# Patient Record
Sex: Female | Born: 1995
Health system: Southern US, Community
[De-identification: ages and names within clinical notes are randomized; demographics above are authoritative.]

## PROBLEM LIST (undated history)

## (undated) ENCOUNTER — Inpatient Hospital Stay (HOSPITAL_COMMUNITY): Payer: Self-pay

## (undated) ENCOUNTER — Emergency Department (HOSPITAL_BASED_OUTPATIENT_CLINIC_OR_DEPARTMENT_OTHER): Admission: EM | Payer: MEDICAID | Source: Home / Self Care

## (undated) DIAGNOSIS — G43909 Migraine, unspecified, not intractable, without status migrainosus: Secondary | ICD-10-CM

## (undated) DIAGNOSIS — D649 Anemia, unspecified: Secondary | ICD-10-CM

## (undated) DIAGNOSIS — K219 Gastro-esophageal reflux disease without esophagitis: Secondary | ICD-10-CM

## (undated) DIAGNOSIS — Z87442 Personal history of urinary calculi: Secondary | ICD-10-CM

## (undated) HISTORY — PX: NO PAST SURGERIES: SHX2092

## (undated) HISTORY — PX: WISDOM TOOTH EXTRACTION: SHX21

## (undated) HISTORY — DX: Migraine, unspecified, not intractable, without status migrainosus: G43.909

---
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
## 2013-04-04 ENCOUNTER — Encounter (HOSPITAL_COMMUNITY): Payer: Self-pay | Admitting: General Practice

## 2013-04-04 ENCOUNTER — Inpatient Hospital Stay (HOSPITAL_COMMUNITY)
Admission: AD | Admit: 2013-04-04 | Discharge: 2013-04-04 | Disposition: A | Discharge status: Home / Self Care | Payer: Medicaid Other | Source: Ambulatory Visit | Attending: Obstetrics and Gynecology | Admitting: Obstetrics and Gynecology

## 2013-04-04 DIAGNOSIS — R109 Unspecified abdominal pain: Secondary | ICD-10-CM | POA: Insufficient documentation

## 2013-04-04 DIAGNOSIS — N912 Amenorrhea, unspecified: Secondary | ICD-10-CM | POA: Insufficient documentation

## 2013-04-04 DIAGNOSIS — Z3202 Encounter for pregnancy test, result negative: Secondary | ICD-10-CM | POA: Insufficient documentation

## 2013-04-04 LAB — POCT PREGNANCY, URINE: Preg Test, Ur: NEGATIVE

## 2013-04-04 LAB — URINALYSIS, ROUTINE W REFLEX MICROSCOPIC
Bilirubin Urine: NEGATIVE
Glucose, UA: NEGATIVE mg/dL
Hgb urine dipstick: NEGATIVE
Ketones, ur: NEGATIVE mg/dL
Protein, ur: NEGATIVE mg/dL

## 2013-04-04 NOTE — MAU Note (Signed)
Pt left without being seen.

## 2013-04-04 NOTE — MAU Note (Signed)
I've been having real bad cramping for 2 weeks. My birth control ran out and I couldn't get more. My period was supposed to come on the 7th but it didn't start.

## 2013-12-12 ENCOUNTER — Encounter (HOSPITAL_COMMUNITY): Payer: Self-pay | Admitting: Emergency Medicine

## 2013-12-12 ENCOUNTER — Emergency Department (HOSPITAL_COMMUNITY): Payer: Medicaid Other

## 2013-12-12 ENCOUNTER — Emergency Department (HOSPITAL_COMMUNITY)
Admission: EM | Admit: 2013-12-12 | Discharge: 2013-12-12 | Disposition: A | Discharge status: Home / Self Care | Payer: Medicaid Other | Attending: Emergency Medicine | Admitting: Emergency Medicine

## 2013-12-12 DIAGNOSIS — R519 Headache, unspecified: Secondary | ICD-10-CM

## 2013-12-12 DIAGNOSIS — R51 Headache: Secondary | ICD-10-CM | POA: Insufficient documentation

## 2013-12-12 DIAGNOSIS — H53149 Visual discomfort, unspecified: Secondary | ICD-10-CM | POA: Insufficient documentation

## 2013-12-12 DIAGNOSIS — F17200 Nicotine dependence, unspecified, uncomplicated: Secondary | ICD-10-CM | POA: Insufficient documentation

## 2013-12-12 DIAGNOSIS — R079 Chest pain, unspecified: Secondary | ICD-10-CM | POA: Insufficient documentation

## 2013-12-12 DIAGNOSIS — Z79899 Other long term (current) drug therapy: Secondary | ICD-10-CM | POA: Insufficient documentation

## 2013-12-12 MED ORDER — METOCLOPRAMIDE HCL 5 MG/ML IJ SOLN
10.0000 mg | Freq: Once | INTRAMUSCULAR | Status: AC
  Administered 2013-12-12: 10 mg via INTRAMUSCULAR
  Filled 2013-12-12: qty 2

## 2013-12-12 MED ORDER — DIPHENHYDRAMINE HCL 50 MG/ML IJ SOLN
25.0000 mg | Freq: Once | INTRAMUSCULAR | Status: AC
  Administered 2013-12-12: 25 mg via INTRAMUSCULAR
  Filled 2013-12-12: qty 1

## 2013-12-12 MED ORDER — OMEPRAZOLE 20 MG PO CPDR: DELAYED_RELEASE_CAPSULE | ORAL | Status: DC

## 2013-12-12 NOTE — ED Notes (Addendum)
Pt reports having a migraine that started two weeks ago. Pt reports intermittent light and sound sensitivity, however denies nausea, and emesis. Pt also reports left sided chest pain that has been "ongoing 8th grade," which she describes as a "lightening" sensation. Pt reports intermittent shortness of breath. Pt is A/O x4, in NAD, and vitals are WDL.

## 2013-12-12 NOTE — ED Provider Notes (Signed)
CSN: [id number]     Arrival date & time 12/12/13  3086 History   First MD Initiated Contact with Patient 12/12/13 1856     Chief Complaint  Patient presents with  . Migraine  . Chest Pain     (Consider location/radiation/quality/duration/timing/severity/associated sxs/prior Treatment) HPI Comments: Patient presents with complaint of migraine headache as well as chest pain. Patient has had frequent chest pains for the past 4-5 years. Patient describes pain as being in the middle of her chest, described as "lightening", coming in quickly lasting for several seconds and then resolving. Sometimes the pain comes back to back. Sometimes the pain returns after an hour. She will go days to weeks without having pain. She has been to the ED several times and never has gotten a diagnosis. She has never seen a primary care physician for workup. Pain does not change with food. She does not feel short of breath or lightheaded. Patient denies risk factors for pulmonary embolism including: unilateral leg swelling, history of DVT/PE/other blood clots, use of estrogens, recent immobilizations, recent surgery, recent travel (>4hr segment), malignancy, hemoptysis. Sometimes patient has sour taste in mouth and feels like food moves slowly down her esophagus after swallowing. No regurgitation.   Headache is same as previous migraine headaches. Her headache today started at 1:30 PM. It is frontal and sharp. She has photophobia. No nausea or vomiting. No treatments prior to arrival for either problem. No head injury, fever, or neck pain.    Patient is a 18 y.o. female presenting with migraines and chest pain. The history is provided by the patient.  Migraine Associated symptoms include chest pain and headaches. Pertinent negatives include no abdominal pain, congestion, coughing, diaphoresis, fever, nausea, neck pain, numbness, rash, vomiting or weakness.  Chest Pain Associated symptoms: headache   Associated symptoms:  no abdominal pain, no back pain, no cough, no diaphoresis, no fever, no nausea, no numbness, no palpitations, no shortness of breath, not vomiting and no weakness     History reviewed. No pertinent past medical history. History reviewed. No pertinent past surgical history. No family history on file. History  Substance Use Topics  . Smoking status: Current Some Day Smoker -- 0.25 packs/day    Types: Cigarettes  . Smokeless tobacco: Current User  . Alcohol Use: No   OB History   Grav Para Term Preterm Abortions TAB SAB Ect Mult Living   1 1 1       1      Review of Systems  Constitutional: Negative for fever and diaphoresis.  HENT: Negative for congestion, dental problem, rhinorrhea and sinus pressure.   Eyes: Positive for photophobia. Negative for discharge, redness and visual disturbance.  Respiratory: Negative for cough and shortness of breath.   Cardiovascular: Positive for chest pain. Negative for palpitations and leg swelling.  Gastrointestinal: Negative for nausea, vomiting and abdominal pain.  Genitourinary: Negative for dysuria.  Musculoskeletal: Negative for back pain, gait problem, neck pain and neck stiffness.  Skin: Negative for rash.  Neurological: Positive for headaches. Negative for syncope, speech difficulty, weakness, light-headedness and numbness.  Psychiatric/Behavioral: Negative for confusion.    Allergies  Review of patient's allergies indicates no known allergies.  Home Medications   Prior to Admission medications   Medication Sig Start Date End Date Taking? Authorizing Provider  ferrous fumarate (HEMOCYTE - 106 MG FE) 325 (106 FE) MG TABS Take 1 tablet by mouth daily.    Historical Provider, MD   BP 107/58  Pulse 98  Temp(Src)  98.6 F (37 C) (Oral)  Resp 18  SpO2 99%  LMP 11/11/2013  Physical Exam  Nursing note and vitals reviewed. Constitutional: She is oriented to person, place, and time. She appears well-developed and well-nourished.  HENT:   Head: Normocephalic and atraumatic.  Right Ear: Tympanic membrane, external ear and ear canal normal.  Left Ear: Tympanic membrane, external ear and ear canal normal.  Nose: Nose normal.  Mouth/Throat: Uvula is midline, oropharynx is clear and moist and mucous membranes are normal. Mucous membranes are not dry.  Eyes: Conjunctivae, EOM and lids are normal. Pupils are equal, round, and reactive to light. Right eye exhibits no nystagmus. Left eye exhibits no nystagmus.  Neck: Trachea normal and normal range of motion. Neck supple. No JVD present. No muscular tenderness present. No tracheal deviation present.  Cardiovascular: Normal rate, regular rhythm, S1 normal, S2 normal, normal heart sounds and intact distal pulses.  Exam reveals no decreased pulses.   No murmur heard. Pulmonary/Chest: Effort normal and breath sounds normal. No respiratory distress. She has no wheezes. She exhibits no tenderness.  Abdominal: Soft. Normal aorta and bowel sounds are normal. There is no tenderness. There is no rebound and no guarding.  Musculoskeletal: Normal range of motion.       Cervical back: She exhibits normal range of motion, no tenderness and no bony tenderness.  Neurological: She is alert and oriented to person, place, and time. She has normal strength and normal reflexes. No cranial nerve deficit or sensory deficit. She displays a negative Romberg sign. Coordination and gait normal. GCS eye subscore is 4. GCS verbal subscore is 5. GCS motor subscore is 6.  Skin: Skin is warm and dry. She is not diaphoretic. No cyanosis. No pallor.  Psychiatric: She has a normal mood and affect.    ED Course  Procedures (including critical care time) Labs Review Labs Reviewed - No data to display  Imaging Review Dg Chest 2 View  12/12/2013   CLINICAL DATA:  Chest pain  EXAM: CHEST  2 VIEW  COMPARISON:  05/25/2008  FINDINGS: The lungs are clear without focal consolidation, edema, effusion or pneumothorax. Cardio  pericardial silhouette is within normal limits for size. Imaged bony structures of the thorax are intact. Telemetry leads overlie the chest.  IMPRESSION: Stable.  No acute findings.   Electronically Signed   By: Kennith Center M.D.   On: 12/12/2013 19:57     EKG Interpretation None       Date: 12/12/2013  Rate:98  Rhythm: normal sinus rhythm  QRS Axis: normal  Intervals: normal  ST/T Wave abnormalities: none  Conduction Disutrbances:none  Narrative Interpretation:   Old EKG Reviewed: none available   7:26 PM Patient seen and examined. Plan: check EKG, CXR, migraine cocktail. We discussed the importance of having PCP care for problems like this. Will discharge home on PPI trial in case this represents esophageal spasm.   Vital signs reviewed and are as follows: Filed Vitals:   12/12/13 1832  BP: 107/58  Pulse: 98  Temp: 98.6 F (37 C)  Resp: 18   8:58 PM Patient with complete resolution of headache pain after migraine cocktail. No return of chest pain. Patient informed of her chest x-ray and EKG results. Will start patient on Prilosec. PCP referral given. Patient encouraged to followup with primary care physician for this per previous discussion.  Patient was counseled to return with severe chest pain, especially if the pain is crushing or pressure-like and spreads to the arms, back,  neck, or jaw, or if they have sweating, nausea, or shortness of breath with the pain. They were encouraged to call 911 with these symptoms.   They were also told to return if their chest pain gets worse and does not go away with rest, they have an attack of chest pain lasting longer than usual despite rest and treatment with the medications their caregiver has prescribed, if they wake from sleep with chest pain or shortness of breath, if they feel dizzy or faint, if they have chest pain not typical of their usual pain, or if they have any other emergent concerns regarding their health.  The patient  verbalized understanding and agreed.   MDM   Final diagnoses:  Acute nonintractable headache, unspecified headache type  Chest pain, unspecified chest pain type   HA: Symptoms resolved with migraine cocktail. Patient without high-risk features of headache including: sudden onset/thunderclap HA, no similar headache in past, altered mental status, accompanying seizure, headache with exertion, age > 8750, history of immunocompromised, neck or shoulder pain, fever, use of anticoagulation, family history of spontaneous SAH, concomitant drug use, toxic exposure.   Patient has a normal complete neurological exam, normal vital signs, normal level of consciousness, no signs of meningismus, is well-appearing/non-toxic appearing, no signs of trauma, no pain over the temporal arteries.   Imaging with CT/MRI not indicated given history and physical exam findings.   No dangerous or life-threatening conditions suspected or identified by history, physical exam, and by work-up. No indications for hospitalization identified.   CP: intermittent x 5 years. Do not suspect persistent pain from PE with this character. Possible esophageal spasm -- will give Trial of omeprazole. Also consider precordial catch. Doubt gallbladder, pancreatitis. PCP follow-up stressed.      Renne CriglerJoshua Addis Bennie, PA-C 12/12/13 2103

## 2013-12-12 NOTE — Discharge Instructions (Signed)
Please read and follow all provided instructions.  Your diagnoses today include:  1. Acute nonintractable headache, unspecified headache type   2. Chest pain, unspecified chest pain type     Tests performed today include:  Chest x-ray - normal  EKG - normal  Vital signs. See below for your results today.   Medications:  In the Emergency Department you received:  Reglan - antinausea/headache medication  Benadryl - antihistamine to counteract potential side effects of reglan   Omeprazole (Prilosec) - stomach acid reducer  This medication can be found over-the-counter  Take any prescribed medications only as directed.  Additional information:  Follow any educational materials contained in this packet.  You are having a headache. No specific cause was found today for your headache. It may have been a migraine or other cause of headache. Stress, anxiety, fatigue, and depression are common triggers for headaches.   Your headache today does not appear to be life-threatening or require hospitalization, but often the exact cause of headaches is not determined in the emergency department. Therefore, follow-up with your doctor is very important to find out what may have caused your headache and whether or not you need any further diagnostic testing or treatment.   Sometimes headaches can appear benign (not harmful), but then more serious symptoms can develop which should prompt an immediate re-evaluation by your doctor or the emergency department.  BE VERY CAREFUL not to take multiple medicines containing Tylenol (also called acetaminophen). Doing so can lead to an overdose which can damage your liver and cause liver failure and possibly death.   Follow-up instructions: Please follow-up with your primary care provider in the next 3 days for further evaluation of your symptoms.   Return instructions:   Please return to the Emergency Department if you experience worsening  symptoms.  Return if the medications do not resolve your headache, if it recurs, or if you have multiple episodes of vomiting or cannot keep down fluids.  Return if you have a change from the usual headache.  RETURN IMMEDIATELY IF you:  Develop a sudden, severe headache  Develop confusion or become poorly responsive or faint  Develop a fever above 100.58F or problem breathing  Have a change in speech, vision, swallowing, or understanding  Develop new weakness, numbness, tingling, incoordination in your arms or legs  Have a seizure  Please return if you have any other emergent concerns.  Additional Information:  Your vital signs today were: BP 107/58   Pulse 98   Temp(Src) 98.6 F (37 C) (Oral)   Resp 18   SpO2 99%   LMP 11/11/2013 If your blood pressure (BP) was elevated above 135/85 this visit, please have this repeated by your doctor within one month. --------------

## 2013-12-13 NOTE — ED Provider Notes (Signed)
Medical screening examination/treatment/procedure(s) were performed by non-physician practitioner and as supervising physician I was immediately available for consultation/collaboration.   EKG Interpretation None        William Chaitanya Amedee, MD 12/13/13 0024 

## 2014-03-23 ENCOUNTER — Encounter (HOSPITAL_COMMUNITY): Payer: Self-pay | Admitting: Emergency Medicine

## 2014-05-22 NOTE — L&D Delivery Note (Signed)
Delivery Note At 4:33 PM a viable female was delivered via NSVD (Presentation: Cephalic).  APGAR: 9, 9; weight pending .   Placenta status: delivered, spontaneous, intact.  Cord: 3 VC with the following complications: none .  Cord pH: not done  Anesthesia: Epidural  Episiotomy:  none Lacerations:  1st degree Suture Repair: 3.0 vicryl Est. Blood Loss (mL): 224   Mom to postpartum.  Baby to Couplet care / Skin to Skin.  Candelaria Celeste JEHIEL 01/11/2015, 4:57 PM

## 2014-05-31 ENCOUNTER — Encounter (HOSPITAL_COMMUNITY): Payer: Self-pay | Admitting: *Deleted

## 2014-05-31 ENCOUNTER — Emergency Department (HOSPITAL_COMMUNITY): Payer: Medicaid Other

## 2014-05-31 ENCOUNTER — Emergency Department (HOSPITAL_COMMUNITY)
Admission: EM | Admit: 2014-05-31 | Discharge: 2014-05-31 | Disposition: A | Discharge status: Home / Self Care | Payer: Medicaid Other | Attending: Emergency Medicine | Admitting: Emergency Medicine

## 2014-05-31 DIAGNOSIS — O9989 Other specified diseases and conditions complicating pregnancy, childbirth and the puerperium: Secondary | ICD-10-CM | POA: Insufficient documentation

## 2014-05-31 DIAGNOSIS — O2391 Unspecified genitourinary tract infection in pregnancy, first trimester: Secondary | ICD-10-CM | POA: Insufficient documentation

## 2014-05-31 DIAGNOSIS — Z3490 Encounter for supervision of normal pregnancy, unspecified, unspecified trimester: Secondary | ICD-10-CM

## 2014-05-31 DIAGNOSIS — O210 Mild hyperemesis gravidarum: Secondary | ICD-10-CM | POA: Diagnosis not present

## 2014-05-31 DIAGNOSIS — N390 Urinary tract infection, site not specified: Secondary | ICD-10-CM | POA: Insufficient documentation

## 2014-05-31 DIAGNOSIS — Z3A01 Less than 8 weeks gestation of pregnancy: Secondary | ICD-10-CM | POA: Diagnosis not present

## 2014-05-31 DIAGNOSIS — F17210 Nicotine dependence, cigarettes, uncomplicated: Secondary | ICD-10-CM | POA: Insufficient documentation

## 2014-05-31 DIAGNOSIS — R1030 Lower abdominal pain, unspecified: Secondary | ICD-10-CM | POA: Insufficient documentation

## 2014-05-31 DIAGNOSIS — O99331 Smoking (tobacco) complicating pregnancy, first trimester: Secondary | ICD-10-CM | POA: Diagnosis not present

## 2014-05-31 DIAGNOSIS — R51 Headache: Secondary | ICD-10-CM | POA: Insufficient documentation

## 2014-05-31 LAB — COMPREHENSIVE METABOLIC PANEL
ALT: 16 U/L (ref 0–35)
AST: 20 U/L (ref 0–37)
Albumin: 3.9 g/dL (ref 3.5–5.2)
Alkaline Phosphatase: 56 U/L (ref 39–117)
Anion gap: 11 (ref 5–15)
CALCIUM: 9.1 mg/dL (ref 8.4–10.5)
CO2: 20 mmol/L (ref 19–32)
CREATININE: 0.62 mg/dL (ref 0.50–1.10)
Chloride: 105 mEq/L (ref 96–112)
GFR calc Af Amer: >90 mL/min (ref >90)
GFR calc non Af Amer: >90 mL/min (ref >90)
GLUCOSE: 81 mg/dL (ref 70–99)
POTASSIUM: 4.2 mmol/L (ref 3.5–5.1)
Sodium: 136 mmol/L (ref 135–145)
Total Bilirubin: 0.3 mg/dL (ref 0.3–1.2)
Total Protein: 6.4 g/dL (ref 6.0–8.3)

## 2014-05-31 LAB — CBC
HCT: 36.5 % (ref 36.0–46.0)
Hemoglobin: 12.3 g/dL (ref 12.0–15.0)
MCH: 30.3 pg (ref 26.0–34.0)
MCHC: 33.7 g/dL (ref 30.0–36.0)
MCV: 89.9 fL (ref 78.0–100.0)
PLATELETS: 205 10*3/uL (ref 150–400)
RBC: 4.06 MIL/uL (ref 3.87–5.11)
RDW: 12.7 % (ref 11.5–15.5)
WBC: 12.5 10*3/uL — AB (ref 4.0–10.5)

## 2014-05-31 LAB — URINALYSIS, ROUTINE W REFLEX MICROSCOPIC
BILIRUBIN URINE: NEGATIVE
Glucose, UA: NEGATIVE mg/dL
Hgb urine dipstick: NEGATIVE
KETONES UR: NEGATIVE mg/dL
Nitrite: NEGATIVE
PROTEIN: NEGATIVE mg/dL
Specific Gravity, Urine: 1.024 (ref 1.005–1.030)
Urobilinogen, UA: 0.2 mg/dL (ref 0.0–1.0)
pH: 7.5 (ref 5.0–8.0)

## 2014-05-31 LAB — LIPASE, BLOOD: Lipase: 32 U/L (ref 11–59)

## 2014-05-31 LAB — URINE MICROSCOPIC-ADD ON

## 2014-05-31 LAB — POC URINE PREG, ED: PREG TEST UR: POSITIVE — AB

## 2014-05-31 LAB — HCG, QUANTITATIVE, PREGNANCY: hCG, Beta Chain, Quant, S: 139765 m[IU]/mL — ABNORMAL HIGH (ref <5)

## 2014-05-31 MED ORDER — NITROFURANTOIN MONOHYD MACRO 100 MG PO CAPS: 100.0000 mg | ORAL_CAPSULE | Freq: Two times a day (BID) | ORAL | Status: DC

## 2014-05-31 NOTE — ED Notes (Signed)
Pt reports possibly being pregnant, having lower abd cramping today but denies any bleeding.

## 2014-05-31 NOTE — ED Provider Notes (Signed)
CSN: [id number]     Arrival date & time 05/31/14  1324 History   First MD Initiated Contact with Patient 05/31/14 1743     Chief Complaint  Patient presents with  . Abdominal Pain     (Consider location/radiation/quality/duration/timing/severity/associated sxs/prior Treatment) Patient is a 19 y.o. female presenting with abdominal pain. The history is provided by the patient and medical records. No language interpreter was used.  Abdominal Pain Associated symptoms: nausea and vomiting ( x3)   Associated symptoms: no chest pain, no constipation, no cough, no diarrhea, no dysuria, no fatigue, no fever, no hematuria and no shortness of breath      Heather Fox is a 19 y.o. female  [id number] with a no major medical Hx presents to the Emergency Department complaining of gradual, persistent, progressively worsening lower cramping abd pain onset 3 weeks ago with several episodes of NBNB emesis. Associated symptoms include generalized headaches normal for the patient which she has not taken anything for.  Pt has taken pepto bismol without relief.  Nothing makes it better and nothing makes it worse.  Pt denies fever, chills, headache, neck pain, chest pain, SOB, diarrhea, weakness, dizziness, syncope, dysuria, hematuria, vaginal bleeding, vaginal pain.  Pt without hx of STD.  LMP: Apr 09, 2014 with positive preg test at home.     History reviewed. No pertinent past medical history. History reviewed. No pertinent past surgical history. History reviewed. No pertinent family history. History  Substance Use Topics  . Smoking status: Current Some Day Smoker -- 0.25 packs/day    Types: Cigarettes  . Smokeless tobacco: Current User  . Alcohol Use: No   OB History    Gravida Para Term Preterm AB TAB SAB Ectopic Multiple Living   Review of Systems  Constitutional: Negative for fever, diaphoresis, appetite change, fatigue and unexpected weight change.  HENT: Negative for mouth  sores.   Eyes: Negative for visual disturbance.  Respiratory: Negative for cough, chest tightness, shortness of breath and wheezing.   Cardiovascular: Negative for chest pain.  Gastrointestinal: Positive for nausea, vomiting ( x3) and abdominal pain. Negative for diarrhea and constipation.  Endocrine: Negative for polydipsia, polyphagia and polyuria.  Genitourinary: Negative for dysuria, urgency, frequency and hematuria.  Musculoskeletal: Negative for back pain and neck stiffness.  Skin: Negative for rash.  Allergic/Immunologic: Negative for immunocompromised state.  Neurological: Positive for headaches. Negative for syncope and light-headedness.  Hematological: Does not bruise/bleed easily.  Psychiatric/Behavioral: Negative for sleep disturbance. The patient is not nervous/anxious.       Allergies  Review of patient's allergies indicates no known allergies.  Home Medications   Prior to Admission medications   Medication Sig Start Date End Date Taking? Authorizing Provider  ibuprofen (ADVIL,MOTRIN) 200 MG tablet Take 400 mg by mouth every 6 (six) hours as needed (pain).   Yes Historical Provider, MD  nitrofurantoin, macrocrystal-monohydrate, (MACROBID) 100 MG capsule Take 1 capsule (100 mg total) by mouth 2 (two) times daily. 05/31/14   Addasyn Mcbreen, PA-C   BP 112/71 mmHg  Pulse 87  Temp(Src) 98.5 F (36.9 C) (Oral)  Resp 18  SpO2 100%  LMP 04/09/2014 Physical Exam  Constitutional: She appears well-developed and well-nourished. No distress.  Awake, alert, nontoxic appearance  HENT:  Head: Normocephalic and atraumatic.  Mouth/Throat: Oropharynx is clear and moist. No oropharyngeal exudate.  Eyes: Conjunctivae are normal. No scleral icterus.  Neck: Normal range of motion. Neck supple.  Cardiovascular: Normal rate, regular rhythm, normal heart sounds and intact distal pulses.   Pulmonary/Chest: Effort normal and breath sounds normal. No respiratory distress. She has no  wheezes.  Equal chest expansion  Abdominal: Soft. Bowel sounds are normal. She exhibits no distension and no mass. There is no tenderness. There is no rebound and no guarding.  abd soft and nontender No CVA tenderness  Musculoskeletal: Normal range of motion. She exhibits no edema.  Neurological: She is alert.  Speech is clear and goal oriented Moves extremities without ataxia  Skin: Skin is warm and dry. She is not diaphoretic. No erythema.  Psychiatric: She has a normal mood and affect.  Nursing note and vitals reviewed.   ED Course  Procedures (including critical care time) Labs Review Labs Reviewed  URINALYSIS, ROUTINE W REFLEX MICROSCOPIC - Abnormal; Notable for the following:    APPearance TURBID (*)    Leukocytes, UA MODERATE (*)    All other components within normal limits  URINE MICROSCOPIC-ADD ON - Abnormal; Notable for the following:    Squamous Epithelial / LPF MANY (*)    All other components within normal limits  HCG, QUANTITATIVE, PREGNANCY - Abnormal; Notable for the following:    hCG, Beta Chain, [place] Longest [id number] (*)    All other components within normal limits  CBC - Abnormal; Notable for the following:    WBC 12.5 (*)    All other components within normal limits  COMPREHENSIVE METABOLIC PANEL - Abnormal; Notable for the following:    BUN <5 (*)    All other components within normal limits  POC URINE PREG, ED - Abnormal; Notable for the following:    Preg Test, Ur POSITIVE (*)    All other components within normal limits  WET PREP, GENITAL  URINE CULTURE  LIPASE, BLOOD  ABO/RH    Imaging Review US Ob Comp Less 14 Wks  05/31/2014   CLINICAL DATA:  Pregnant patient with lower abdominal pain.  EXAM: OBSTETRIC <14 WK [place] AND TRANSVAGINAL OB US  TECHNIQUE: Both transabdominal and transvaginal ultrasound examinations were performed for complete evaluation of the gestation as well as the maternal uterus, adnexal regions, and pelvic cul-de-sac. Transvaginal  technique was performed to assess early pregnancy.  COMPARISON:  None.  FINDINGS: Intrauterine gestational sac: Visualized/normal in shape. No perigestational hemorrhage.  Yolk sac:  Present.  Embryo:  Present.  Cardiac Activity: Present.  Heart Rate:  113 bpm  CRL:   7.7  mm   6 w 5 d                  [place] EDC: 01/19/2015  Maternal uterus/adnexae: The right ovary is normal with normal blood flow. Within the left ovary there is a 1.9 x 2.0 x 2.0 cm cyst with some peripheral blood flow, likely corpus luteum. Normal blood flow is seen. No free fluid or adnexal mass.  IMPRESSION: 1. Single live intrauterine pregnancy with fetal heart tones, estimated gestational age [redacted] weeks 5 days for estimated date of delivery 01/19/2015. 2. Probable corpus luteal cyst in the left ovary.   Electronically Signed   By: Rubye Oaks M.D.   On: 05/31/2014 19:55   US Ob Transvaginal  05/31/2014   CLINICAL DATA:  Pregnant patient with lower abdominal pain.  EXAM: OBSTETRIC <14 WK [place] AND TRANSVAGINAL OB US  TECHNIQUE: Both transabdominal and transvaginal ultrasound examinations were performed for complete evaluation of the gestation as well as the maternal uterus, adnexal regions, and pelvic cul-de-sac. Transvaginal  technique was performed to assess early pregnancy.  COMPARISON:  None.  FINDINGS: Intrauterine gestational sac: Visualized/normal in shape. No perigestational hemorrhage.  Yolk sac:  Present.  Embryo:  Present.  Cardiac Activity: Present.  Heart Rate:  113 bpm  CRL:   7.7  mm   6 w 5 d                  [place] EDC: 01/19/2015  Maternal uterus/adnexae: The right ovary is normal with normal blood flow. Within the left ovary there is a 1.9 x 2.0 x 2.0 cm cyst with some peripheral blood flow, likely corpus luteum. Normal blood flow is seen. No free fluid or adnexal mass.  IMPRESSION: 1. Single live intrauterine pregnancy with fetal heart tones, estimated gestational age [redacted] weeks 5 days for estimated date of delivery 01/19/2015. 2.  Probable corpus luteal cyst in the left ovary.   Electronically Signed   By: Rubye OaksMelanie  Ehinger M.D.   On: 05/31/2014 19:55     EKG Interpretation None      MDM   Final diagnoses:  Lower abdominal pain  Pregnancy  UTI (lower urinary tract infection)   Heather Fox presents with lower abd cramping and positive home pregnancy test. Last menstrual cycle was in November. Patient with positive urine pregnancy test and UA with moderate leukocytes and 3-6 white blood cells.  We'll obtain ultrasound, pelvic exam, labs and reassess.   8:10PM Labs reassuring.  Pt blood type B neg however she denies vaginal bleeding, vaginal discharge or vaginal irritation.  She is tolerating by mouth here in the department.  Ultrasound with single live intrauterine pregnancy with fetal heart rate of 113 estimated at 6 weeks and 5 days.  Discussed with patient these findings. She continues to deny vaginal bleeding or vaginal discharge. Discussed pelvic exam here versus pelvic exam with OB/GYN and the patient elected to decline pelvic exam here in the emergency department today. No evidence of miscarriage.  Patient not given a program because no evidence of vaginal bleeding or miscarriage.  Patient given OB/GYN follow-up.  I have personally reviewed patient's vitals, nursing note and any pertinent labs or imaging.  I performed an undressed physical exam.    It has been determined that no acute conditions requiring further emergency intervention are present at this time. The patient/guardian have been advised of the diagnosis and plan. I reviewed all labs and imaging including any potential incidental findings. We have discussed signs and symptoms that warrant return to the ED and they are listed in the discharge instructions.    Vital signs are stable at discharge.   BP 112/71 mmHg  Pulse 87  Temp(Src) 98.5 F (36.9 C) (Oral)  Resp 18  SpO2 100%  LMP 04/09/2014   The patient was discussed with and seen by  Dr. Rubin PayorPickering who agrees with the treatment plan.      Heather ClientHannah Marieanne Marxen, PA-C 05/31/14 2126  Juliet RudeNathan R. Rubin PayorPickering, MD 06/01/14 [phone]

## 2014-05-31 NOTE — Discharge Instructions (Signed)
1. Medications: usual home medications 2. Treatment: rest, drink plenty of fluids,  3. Follow Up: Please followup with OB/GYN in 3 days for discussion of your diagnoses and further evaluation after today's visit; if you do not have a primary care doctor use the resource guide provided to find one; [facility] for [facility] bleeding, worsening pain or other concerning symptoms.    Abdominal Pain During Pregnancy Abdominal pain is common in pregnancy. Most of the time, it does not cause harm. There are many causes of abdominal pain. Some causes are more serious than others. Some of the causes of abdominal pain in pregnancy are easily diagnosed. Occasionally, the diagnosis takes time to understand. Other times, the cause is not determined. Abdominal pain can be a sign that something is very wrong with the pregnancy, or the pain may have nothing to do with the pregnancy at all. For this reason, always tell your health care provider if you have any abdominal discomfort. HOME CARE INSTRUCTIONS  Monitor your abdominal pain for any changes. The following actions may help to alleviate any discomfort you are experiencing:  Do not have sexual intercourse or put anything in your vagina until your symptoms go away completely.  Get plenty of rest until your pain improves.  Drink clear fluids if you feel nauseous. Avoid solid food as long as you are uncomfortable or nauseous.  Only take over-the-counter or prescription medicine as directed by your health care provider.  Keep all follow-up appointments with your health care provider. SEEK IMMEDIATE MEDICAL CARE IF:  You are bleeding, leaking fluid, or passing tissue from the vagina.  You have increasing pain or cramping.  You have persistent vomiting.  You have painful or bloody urination.  You have a fever.  You notice a decrease in your baby's movements.  You have extreme weakness or feel faint.  You have shortness of breath,  with or without abdominal pain.  You develop a severe headache with abdominal pain.  You have abnormal vaginal discharge with abdominal pain.  You have persistent diarrhea.  You have abdominal pain that continues even after rest, or gets worse. MAKE SURE YOU:   Understand these instructions.  Will watch your condition.  Will get help right away if you are not doing well or get worse. Document Released: 05/08/2005 Document Revised: 02/26/2013 Document Reviewed: 12/05/2012 [facility] Patient Information 2015 [employer], [place]. This information is not intended to replace advice given to you by your health care provider. Make sure you discuss any questions you have with your health care provider.

## 2014-06-01 LAB — URINE CULTURE
COLONY COUNT: NO GROWTH
Culture: NO GROWTH

## 2014-06-01 LAB — ABO/RH
ABO/RH(D): B NEG
ANTIBODY SCREEN: NEGATIVE

## 2014-07-08 ENCOUNTER — Ambulatory Visit (INDEPENDENT_AMBULATORY_CARE_PROVIDER_SITE_OTHER): Payer: Medicaid Other | Admitting: Advanced Practice Midwife

## 2014-07-08 ENCOUNTER — Encounter: Payer: Self-pay | Admitting: Advanced Practice Midwife

## 2014-07-08 ENCOUNTER — Other Ambulatory Visit: Payer: Self-pay | Admitting: Advanced Practice Midwife

## 2014-07-08 VITALS — BP 107/60 | HR 87 | Wt 128.9 lb

## 2014-07-08 DIAGNOSIS — Z3481 Encounter for supervision of other normal pregnancy, first trimester: Secondary | ICD-10-CM

## 2014-07-08 DIAGNOSIS — Z3492 Encounter for supervision of normal pregnancy, unspecified, second trimester: Secondary | ICD-10-CM

## 2014-07-08 DIAGNOSIS — O99322 Drug use complicating pregnancy, second trimester: Secondary | ICD-10-CM

## 2014-07-08 DIAGNOSIS — Z3682 Encounter for antenatal screening for nuchal translucency: Secondary | ICD-10-CM

## 2014-07-08 DIAGNOSIS — Z3491 Encounter for supervision of normal pregnancy, unspecified, first trimester: Secondary | ICD-10-CM

## 2014-07-08 DIAGNOSIS — Z3480 Encounter for supervision of other normal pregnancy, unspecified trimester: Secondary | ICD-10-CM | POA: Insufficient documentation

## 2014-07-08 LAB — POCT URINALYSIS DIP (DEVICE)
BILIRUBIN URINE: NEGATIVE
Glucose, UA: NEGATIVE mg/dL
Hgb urine dipstick: NEGATIVE
KETONES UR: NEGATIVE mg/dL
Nitrite: NEGATIVE
Protein, ur: NEGATIVE mg/dL
SPECIFIC GRAVITY, URINE: 1.015 (ref 1.005–1.030)
Urobilinogen, UA: 1 mg/dL (ref 0.0–1.0)
pH: 7 (ref 5.0–8.0)

## 2014-07-08 MED ORDER — PRENATAL VITAMINS 28-0.8 MG PO TABS: 1.0000 | ORAL_TABLET | Freq: Every day | ORAL | Status: DC

## 2014-07-08 NOTE — Progress Notes (Signed)
   Subjective:    Heather Fox is a G2P1002 [id number] being seen today for her first obstetrical visit.  Her obstetrical history is significant for NSVD x 1. Patient does intend to breast feed. Pregnancy history fully reviewed.  Patient reports occasional abdominal cramping.  Filed Vitals:   07/08/14 0927  BP: 107/60  Pulse: 87  Weight: 128 lb 14.4 oz (58.469 kg)    HISTORY: OB History  Gravida Para Term Preterm AB SAB TAB Ectopic Multiple Living  2 1 1       2     # Outcome Date GA Lbr Len/2nd Weight Sex Delivery Anes PTL Lv  2 Current           1 Term 05/21/09    F Vag-Spont  N Y     History reviewed. No pertinent past medical history. History reviewed. No pertinent past surgical history. History reviewed. No pertinent family history.   Exam    Uterus:   ~12 week size  Pelvic Exam:    Perineum: No Hemorrhoids, Normal Perineum   Vulva: normal   Vagina:  normal mucosa, normal discharge   pH:    Cervix: no cervical motion tenderness, 0/thick/high   Adnexa: normal adnexa and no mass, fullness, tenderness   Bony Pelvis: average  System: Breast:  normal appearance, no masses or tenderness   Skin: normal coloration and turgor, no rashes    Neurologic: oriented, normal, normal mood, gait normal; reflexes normal and symmetric   Extremities: normal strength, tone, and muscle mass, ROM of all joints is normal   HEENT neck supple with midline trachea and thyroid without masses   Mouth/Teeth mucous membranes moist, pharynx normal without lesions and dental hygiene good   Neck supple and no masses   Cardiovascular:    Respiratory:  appears well, vitals normal, no respiratory distress, acyanotic, normal RR, ear and throat exam is normal, neck free of mass or lymphadenopathy   Abdomen: soft, non-tender; bowel sounds normal; no masses,  no organomegaly   Urinary: urethral meatus normal      Assessment:    Pregnancy: [id number] Patient Active Problem List   Diagnosis Date  Noted  . Supervision of normal subsequent pregnancy 07/08/2014        Plan:     Initial labs drawn. Prenatal vitamins. Problem list reviewed and updated. Increase PO fluids if cramping.  Discussed reasons to come to [facility]. Genetic Screening discussed First Screen: ordered.  Ultrasound discussed; fetal survey: requested.  Follow up in 4 weeks. 50% of 30 min visit spent on counseling and coordination of care.     LEFTWICH-KIRBY, Azayla Polo 07/08/2014

## 2014-07-09 LAB — PRENATAL PROFILE (SOLSTAS)
ANTIBODY SCREEN: NEGATIVE
Basophils Absolute: 0 10*3/uL (ref 0.0–0.1)
Basophils Relative: 0 % (ref 0–1)
EOS ABS: 0 10*3/uL (ref 0.0–0.7)
Eosinophils Relative: 0 % (ref 0–5)
HCT: 36.5 % (ref 36.0–46.0)
HIV 1&2 Ab, 4th Generation: NONREACTIVE
Hemoglobin: 12.2 g/dL (ref 12.0–15.0)
Hepatitis B Surface Ag: NEGATIVE
LYMPHS ABS: 1.8 10*3/uL (ref 0.7–4.0)
Lymphocytes Relative: 18 % (ref 12–46)
MCH: 30.2 pg (ref 26.0–34.0)
MCHC: 33.4 g/dL (ref 30.0–36.0)
MCV: 90.3 fL (ref 78.0–100.0)
MONO ABS: 0.6 10*3/uL (ref 0.1–1.0)
MPV: 11.9 fL (ref 8.6–12.4)
Monocytes Relative: 6 % (ref 3–12)
Neutro Abs: 7.7 10*3/uL (ref 1.7–7.7)
Neutrophils Relative %: 76 % (ref 43–77)
Platelets: 196 10*3/uL (ref 150–400)
RBC: 4.04 MIL/uL (ref 3.87–5.11)
RDW: 13.2 % (ref 11.5–15.5)
RUBELLA: 3.34 {index} — AB (ref <0.90)
Rh Type: NEGATIVE
WBC: 10.1 10*3/uL (ref 4.0–10.5)

## 2014-07-09 LAB — GC/CHLAMYDIA PROBE AMP
CT Probe RNA: NEGATIVE
GC Probe RNA: NEGATIVE

## 2014-07-10 ENCOUNTER — Encounter (HOSPITAL_COMMUNITY): Payer: Self-pay | Admitting: Advanced Practice Midwife

## 2014-07-10 LAB — CULTURE, OB URINE: Colony Count: 9000

## 2014-07-12 LAB — CANNABANOIDS (GC/LC/MS), URINE: THC-COOH (GC/LC/MS), ur confirm: 30 ng/mL — AB (ref <5)

## 2014-07-14 ENCOUNTER — Encounter: Payer: Self-pay | Admitting: *Deleted

## 2014-07-14 LAB — PRESCRIPTION MONITORING PROFILE (19 PANEL)
Amphetamine/Meth: NEGATIVE ng/mL
BARBITURATE SCREEN, URINE: NEGATIVE ng/mL
Benzodiazepine Screen, Urine: NEGATIVE ng/mL
Buprenorphine, Urine: NEGATIVE ng/mL
CARISOPRODOL, URINE: NEGATIVE ng/mL
CREATININE, URINE: 113.49 mg/dL (ref >20.0)
Cocaine Metabolites: NEGATIVE ng/mL
Fentanyl, Ur: NEGATIVE ng/mL
MDMA URINE: NEGATIVE ng/mL
METHAQUALONE SCREEN (URINE): NEGATIVE ng/mL
Meperidine, Ur: NEGATIVE ng/mL
Methadone Screen, Urine: NEGATIVE ng/mL
Nitrites, Initial: NEGATIVE ug/mL
Opiate Screen, Urine: NEGATIVE ng/mL
Oxycodone Screen, Ur: NEGATIVE ng/mL
PH URINE, INITIAL: 7.4 pH (ref 4.5–8.9)
Phencyclidine, Ur: NEGATIVE ng/mL
Propoxyphene: NEGATIVE ng/mL
TAPENTADOLUR: NEGATIVE ng/mL
Tramadol Scrn, Ur: NEGATIVE ng/mL
Zolpidem, Urine: NEGATIVE ng/mL

## 2014-07-15 ENCOUNTER — Encounter: Payer: Self-pay | Admitting: Advanced Practice Midwife

## 2014-07-15 DIAGNOSIS — O99322 Drug use complicating pregnancy, second trimester: Secondary | ICD-10-CM | POA: Insufficient documentation

## 2014-07-17 ENCOUNTER — Ambulatory Visit (HOSPITAL_COMMUNITY)
Admission: RE | Admit: 2014-07-17 | Discharge: 2014-07-17 | Disposition: A | Discharge status: Home / Self Care | Payer: Medicaid Other | Source: Ambulatory Visit | Attending: Advanced Practice Midwife | Admitting: Advanced Practice Midwife

## 2014-07-17 ENCOUNTER — Encounter (HOSPITAL_COMMUNITY): Payer: Self-pay

## 2014-07-17 VITALS — BP 106/58 | HR 97 | Wt 132.0 lb

## 2014-07-17 DIAGNOSIS — Z36 Encounter for antenatal screening of mother: Secondary | ICD-10-CM | POA: Insufficient documentation

## 2014-07-17 DIAGNOSIS — Z3481 Encounter for supervision of other normal pregnancy, first trimester: Secondary | ICD-10-CM

## 2014-07-17 DIAGNOSIS — Z3682 Encounter for antenatal screening for nuchal translucency: Secondary | ICD-10-CM | POA: Insufficient documentation

## 2014-07-17 DIAGNOSIS — Z3A13 13 weeks gestation of pregnancy: Secondary | ICD-10-CM | POA: Insufficient documentation

## 2014-07-17 DIAGNOSIS — O99322 Drug use complicating pregnancy, second trimester: Secondary | ICD-10-CM

## 2014-07-28 ENCOUNTER — Other Ambulatory Visit (HOSPITAL_COMMUNITY): Payer: Self-pay | Admitting: Advanced Practice Midwife

## 2014-08-05 ENCOUNTER — Encounter: Payer: Self-pay | Admitting: Physician Assistant

## 2014-08-05 ENCOUNTER — Ambulatory Visit (INDEPENDENT_AMBULATORY_CARE_PROVIDER_SITE_OTHER): Payer: Medicaid Other | Admitting: Physician Assistant

## 2014-08-05 VITALS — BP 104/50 | HR 101 | Temp 98.4°F | Wt 135.4 lb

## 2014-08-05 DIAGNOSIS — Z3482 Encounter for supervision of other normal pregnancy, second trimester: Secondary | ICD-10-CM

## 2014-08-05 LAB — POCT URINALYSIS DIP (DEVICE)
BILIRUBIN URINE: NEGATIVE
GLUCOSE, UA: NEGATIVE mg/dL
Hgb urine dipstick: NEGATIVE
KETONES UR: NEGATIVE mg/dL
Nitrite: NEGATIVE
Protein, ur: NEGATIVE mg/dL
SPECIFIC GRAVITY, URINE: 1.01 (ref 1.005–1.030)
Urobilinogen, UA: 0.2 mg/dL (ref 0.0–1.0)
pH: 7 (ref 5.0–8.0)

## 2014-08-05 NOTE — Progress Notes (Signed)
C/o intermittent abdominal pains.

## 2014-08-05 NOTE — Progress Notes (Signed)
16 weeks, complaining of occas abd pain.  Denies vag bleeding, LOF, dysuria.  Endorses good fetal movement.  Will schedule anatomy scan PNV qd RTC 4 weeks.

## 2014-08-05 NOTE — Patient Instructions (Signed)
Second Trimester of Pregnancy The second trimester is from week 13 through week 28, months 4 through 6. The second trimester is often a time when you feel your best. Your body has also adjusted to being pregnant, and you begin to feel better physically. Usually, morning sickness has lessened or quit completely, you may have more energy, and you may have an increase in appetite. The second trimester is also a time when the fetus is growing rapidly. At the end of the sixth month, the fetus is about 9 inches long and weighs about 1 pounds. You will likely begin to feel the baby move (quickening) between 18 and 20 weeks of the pregnancy. BODY CHANGES Your body goes through many changes during pregnancy. The changes vary from woman to woman.   Your weight will continue to increase. You will notice your lower abdomen bulging out.  You may begin to get stretch marks on your hips, abdomen, and breasts.  You may develop headaches that can be relieved by medicines approved by your health care provider.  You may urinate more often because the fetus is pressing on your bladder.  You may develop or continue to have heartburn as a result of your pregnancy.  You may develop constipation because certain hormones are causing the muscles that push waste through your intestines to slow down.  You may develop hemorrhoids or swollen, bulging veins (varicose veins).  You may have back pain because of the weight gain and pregnancy hormones relaxing your joints between the bones in your pelvis and as a result of a shift in weight and the muscles that support your balance.  Your breasts will continue to grow and be tender.  Your gums may bleed and may be sensitive to brushing and flossing.  Dark spots or blotches (chloasma, mask of pregnancy) may develop on your face. This will likely fade after the baby is born.  A dark line from your belly button to the pubic area (linea nigra) may appear. This will likely fade  after the baby is born.  You may have changes in your hair. These can include thickening of your hair, rapid growth, and changes in texture. Some women also have hair loss during or after pregnancy, or hair that feels dry or thin. Your hair will most likely return to normal after your baby is born. WHAT TO EXPECT AT YOUR PRENATAL VISITS During a routine prenatal visit:  You will be weighed to make sure you and the fetus are growing normally.  Your blood pressure will be taken.  Your abdomen will be measured to track your baby's growth.  The fetal heartbeat will be listened to.  Any test results from the previous visit will be discussed. Your health care provider may ask you:  How you are feeling.  If you are feeling the baby move.  If you have had any abnormal symptoms, such as leaking fluid, bleeding, severe headaches, or abdominal cramping.  If you have any questions. Other tests that may be performed during your second trimester include:  Blood tests that check for:  Low iron levels (anemia).  Gestational diabetes (between 24 and 28 weeks).  Rh antibodies.  Urine tests to check for infections, diabetes, or protein in the urine.  An ultrasound to confirm the proper growth and development of the baby.  An amniocentesis to check for possible genetic problems.  Fetal screens for spina bifida and Down syndrome. HOME CARE INSTRUCTIONS   Avoid all smoking, herbs, alcohol, and unprescribed   drugs. These chemicals affect the formation and growth of the baby.  Follow your health care provider's instructions regarding medicine use. There are medicines that are either safe or unsafe to take during pregnancy.  Exercise only as directed by your health care provider. Experiencing uterine cramps is a good sign to stop exercising.  Continue to eat regular, healthy meals.  Wear a good support bra for breast tenderness.  Do not use hot tubs, steam rooms, or saunas.  Wear your  seat belt at all times when driving.  Avoid raw meat, uncooked cheese, cat litter boxes, and soil used by cats. These carry germs that can cause birth defects in the baby.  Take your prenatal vitamins.  Try taking a stool softener (if your health care provider approves) if you develop constipation. Eat more high-fiber foods, such as fresh vegetables or fruit and whole grains. Drink plenty of fluids to keep your urine clear or pale yellow.  Take warm sitz baths to soothe any pain or discomfort caused by hemorrhoids. Use hemorrhoid cream if your health care provider approves.  If you develop varicose veins, wear support hose. Elevate your feet for 15 minutes, 3-4 times a day. Limit salt in your diet.  Avoid heavy lifting, wear low heel shoes, and practice good posture.  Rest with your legs elevated if you have leg cramps or low back pain.  Visit your dentist if you have not gone yet during your pregnancy. Use a soft toothbrush to brush your teeth and be gentle when you floss.  A sexual relationship may be continued unless your health care provider directs you otherwise.  Continue to go to all your prenatal visits as directed by your health care provider. SEEK MEDICAL CARE IF:   You have dizziness.  You have mild pelvic cramps, pelvic pressure, or nagging pain in the abdominal area.  You have persistent nausea, vomiting, or diarrhea.  You have a bad smelling vaginal discharge.  You have pain with urination. SEEK IMMEDIATE MEDICAL CARE IF:   You have a fever.  You are leaking fluid from your vagina.  You have spotting or bleeding from your vagina.  You have severe abdominal cramping or pain.  You have rapid weight gain or loss.  You have shortness of breath with chest pain.  You notice sudden or extreme swelling of your face, hands, ankles, feet, or legs.  You have not felt your baby move in over an hour.  You have severe headaches that do not go away with  medicine.  You have vision changes. Document Released: 05/02/2001 Document Revised: 05/13/2013 Document Reviewed: 07/09/2012 [employer] Patient Information 2015 [employer]. This information is not intended to replace advice given to you by your health care provider. Make sure you discuss any questions you have with your health care provider.  

## 2014-08-06 LAB — CULTURE, OB URINE: Colony Count: 30000

## 2014-08-12 ENCOUNTER — Inpatient Hospital Stay (HOSPITAL_COMMUNITY)
Admission: AD | Admit: 2014-08-12 | Discharge: 2014-08-12 | Disposition: A | Discharge status: Home / Self Care | Payer: Medicaid Other | Source: Ambulatory Visit | Attending: Obstetrics and Gynecology | Admitting: Obstetrics and Gynecology

## 2014-08-12 ENCOUNTER — Telehealth: Payer: Self-pay | Admitting: *Deleted

## 2014-08-12 ENCOUNTER — Encounter (HOSPITAL_COMMUNITY): Payer: Self-pay | Admitting: *Deleted

## 2014-08-12 DIAGNOSIS — O9989 Other specified diseases and conditions complicating pregnancy, childbirth and the puerperium: Secondary | ICD-10-CM | POA: Insufficient documentation

## 2014-08-12 DIAGNOSIS — N949 Unspecified condition associated with female genital organs and menstrual cycle: Secondary | ICD-10-CM | POA: Diagnosis not present

## 2014-08-12 DIAGNOSIS — Z3482 Encounter for supervision of other normal pregnancy, second trimester: Secondary | ICD-10-CM

## 2014-08-12 DIAGNOSIS — A5901 Trichomonal vulvovaginitis: Secondary | ICD-10-CM | POA: Diagnosis not present

## 2014-08-12 DIAGNOSIS — O98312 Other infections with a predominantly sexual mode of transmission complicating pregnancy, second trimester: Secondary | ICD-10-CM | POA: Insufficient documentation

## 2014-08-12 DIAGNOSIS — O26899 Other specified pregnancy related conditions, unspecified trimester: Secondary | ICD-10-CM

## 2014-08-12 DIAGNOSIS — O99332 Smoking (tobacco) complicating pregnancy, second trimester: Secondary | ICD-10-CM | POA: Insufficient documentation

## 2014-08-12 DIAGNOSIS — Z3A17 17 weeks gestation of pregnancy: Secondary | ICD-10-CM | POA: Diagnosis not present

## 2014-08-12 DIAGNOSIS — R102 Pelvic and perineal pain: Secondary | ICD-10-CM | POA: Diagnosis not present

## 2014-08-12 DIAGNOSIS — N939 Abnormal uterine and vaginal bleeding, unspecified: Secondary | ICD-10-CM | POA: Diagnosis present

## 2014-08-12 DIAGNOSIS — O23592 Infection of other part of genital tract in pregnancy, second trimester: Secondary | ICD-10-CM

## 2014-08-12 DIAGNOSIS — F17210 Nicotine dependence, cigarettes, uncomplicated: Secondary | ICD-10-CM | POA: Diagnosis not present

## 2014-08-12 DIAGNOSIS — O99322 Drug use complicating pregnancy, second trimester: Secondary | ICD-10-CM

## 2014-08-12 LAB — URINE MICROSCOPIC-ADD ON

## 2014-08-12 LAB — URINALYSIS, ROUTINE W REFLEX MICROSCOPIC
Bilirubin Urine: NEGATIVE
GLUCOSE, UA: NEGATIVE mg/dL
Ketones, ur: NEGATIVE mg/dL
Nitrite: NEGATIVE
Protein, ur: NEGATIVE mg/dL
Specific Gravity, Urine: 1.015 (ref 1.005–1.030)
Urobilinogen, UA: 0.2 mg/dL (ref 0.0–1.0)
pH: 6.5 (ref 5.0–8.0)

## 2014-08-12 LAB — WET PREP, GENITAL
Clue Cells Wet Prep HPF POC: NONE SEEN
Yeast Wet Prep HPF POC: NONE SEEN

## 2014-08-12 MED ORDER — METRONIDAZOLE 500 MG PO TABS
2000.0000 mg | ORAL_TABLET | Freq: Once | ORAL | Status: AC
  Administered 2014-08-12: 2000 mg via ORAL
  Filled 2014-08-12: qty 4

## 2014-08-12 NOTE — Discharge Instructions (Signed)
Trichomoniasis Trichomoniasis is an infection caused by an organism called Trichomonas. The infection can affect both women and men. In women, the outer female genitalia and the vagina are affected. In men, the penis is mainly affected, but the prostate and other reproductive organs can also be involved. Trichomoniasis is a sexually transmitted infection (STI) and is most often passed to another person through sexual contact.  RISK FACTORS  Having unprotected sexual intercourse.  Having sexual intercourse with an infected partner. SIGNS AND SYMPTOMS  Symptoms of trichomoniasis in women include:  Abnormal gray-green frothy vaginal discharge.  Itching and irritation of the vagina.  Itching and irritation of the area outside the vagina. Symptoms of trichomoniasis in men include:   Penile discharge with or without pain.  Pain during urination. This results from inflammation of the urethra. DIAGNOSIS  Trichomoniasis may be found during a Pap test or physical exam. Your health care provider may use one of the following methods to help diagnose this infection:  Examining vaginal discharge under a microscope. For men, urethral discharge would be examined.  Testing the pH of the vagina with a test tape.  Using a vaginal swab test that checks for the Trichomonas organism. A test is available that provides results within a few minutes.  Doing a culture test for the organism. This is not usually needed. TREATMENT   You may be given medicine to fight the infection. Women should inform their health care provider if they could be or are pregnant. Some medicines used to treat the infection should not be taken during pregnancy.  Your health care provider may recommend over-the-counter medicines or creams to decrease itching or irritation.  Your sexual partner will need to be treated if infected. HOME CARE INSTRUCTIONS   Take medicines only as directed by your health care provider.  Take  over-the-counter medicine for itching or irritation as directed by your health care provider.  Do not have sexual intercourse while you have the infection.  Women should not douche or wear tampons while they have the infection.  Discuss your infection with your partner. Your partner may have gotten the infection from you, or you may have gotten it from your partner.  Have your sex partner get examined and treated if necessary.  Practice safe, informed, and protected sex.  See your health care provider for other STI testing. SEEK MEDICAL CARE IF:   You still have symptoms after you finish your medicine.  You develop abdominal pain.  You have pain when you urinate.  You have bleeding after sexual intercourse.  You develop a rash.  Your medicine makes you sick or makes you throw up (vomit). MAKE SURE YOU:  Understand these instructions.  Will watch your condition.  Will get help right away if you are not doing well or get worse. Document Released: 11/01/2000 Document Revised: 09/22/2013 Document Reviewed: 02/17/2013 [facility] Patient Information 2015 [place], [place]. This information is not intended to replace advice given to you by your health care provider. Make sure you discuss any questions you have with your health care provider. Round Ligament Pain During Pregnancy Round ligament pain is a sharp pain or jabbing feeling often felt in the lower belly or groin area on one or both sides. It is one of the most common complaints during pregnancy and is considered a normal part of pregnancy. It is most often felt during the second trimester.  Here is what you need to know about round ligament pain, including some tips to help you  feel better.  Causes of Round Ligament Pain  Several thick ligaments surround and support your womb (uterus) as it grows during pregnancy. One of them is called the round ligament.  The round ligament connects the front part of the womb to your groin,  the area where your legs attach to your pelvis. The round ligament normally tightens and relaxes slowly.  As your baby and womb grow, the round ligament stretches. That makes it more likely to become strained.  Sudden movements can cause the ligament to tighten quickly, like a rubber band snapping. This causes a sudden and quick jabbing feeling.  Symptoms of Round Ligament Pain  Round ligament pain can be concerning and uncomfortable. But it is considered normal as your body changes during pregnancy.  The symptoms of round ligament pain include a sharp, sudden spasm in the belly. It usually affects the right side, but it may happen on both sides. The pain only lasts a few seconds.  Exercise may cause the pain, as will rapid movements such as:  sneezing coughing laughing rolling over in bed standing up too quickly  Treatment of Round Ligament Pain  Here are some tips that may help reduce your discomfort:  Pain relief. Take over-the-counter acetaminophen for pain, if necessary. Ask your doctor if this is OK.  Exercise. Get plenty of exercise to keep your stomach (core) muscles strong. Doing stretching exercises or prenatal yoga can be helpful. Ask your doctor which exercises are safe for you and your baby.  A helpful exercise involves putting your hands and knees on the floor, lowering your head, and pushing your backside into the air.  Avoid sudden movements. Change positions slowly (such as standing up or sitting down) to avoid sudden movements that may cause stretching and pain.  Flex your hips. Bend and flex your hips before you cough, sneeze, or laugh to avoid pulling on the ligaments.  Apply warmth. A heating pad or warm bath may be helpful. Ask your doctor if this is OK. Extreme heat can be dangerous to the baby.  You should try to modify your daily activity level and avoid positions that may worsen the condition.  When to Call the Doctor/Midwife  Always tell your doctor  or midwife about any type of pain you have during pregnancy. Round ligament pain is quick and doesn't last long.  Call your health care provider immediately if you have:  severe pain fever chills pain on urination difficulty walking  Belly pain during pregnancy can be due to many different causes. It is important for your doctor to rule out more serious conditions, including pregnancy complications such as placenta abruption or non-pregnancy illnesses such as:  inguinal hernia appendicitis stomach, liver, and kidney problems Preterm labor pains may sometimes be mistaken for round ligament pain.

## 2014-08-12 NOTE — Progress Notes (Signed)
Wet prep obtained not blood noted

## 2014-08-12 NOTE — Telephone Encounter (Signed)
Attempted to contact patient to review results and schedule 3 hour glucose test, no answer, left message for patient to contact the clinic for results.

## 2014-08-12 NOTE — Telephone Encounter (Signed)
-----   Message from Bertram DenverKaren E Teague Clark, PA-C sent at 08/11/2014  3:09 PM EDT ----- Regarding: failed 1 hour gtt This pt needs 3 hour gtt. KTC

## 2014-08-12 NOTE — [facility] Note (Signed)
Having abd pain x 1 month- cramps and light bleeding started 3 hours ago- only when wipes.  Wants to make sure baby is ok.

## 2014-08-12 NOTE — MAU Provider Note (Signed)
History     CSN: [id number]  Arrival date and time: 08/12/14 1946   First Provider Initiated Contact with Patient 08/12/14 2051      Chief Complaint  Patient presents with  . Vaginal Bleeding  . Abdominal Pain   HPI Comments: Heather Fox is a 19 y.o. G2P1001 at 6430w3d who presents today with bilateral lower abdominal pain that started today while she was at work. She rates her pain 2/10 at this time. She states that when she wiped earlier she had some pink spotting. She states that she has not had any since that one time. She also reports headaches at home, but no headache currently. She is not sure what to take for her headache.   Vaginal Bleeding The patient's primary symptoms include vaginal bleeding (pink spotting with wiping. ). This is a new problem. The current episode started today. The problem occurs rarely. The problem has been resolved. The pain is mild. The problem affects both sides. She is pregnant. Associated symptoms include abdominal pain (2/10). Pertinent negatives include no constipation, diarrhea, dysuria, frequency, nausea, urgency or vomiting. The vaginal bleeding is spotting. She has not been passing clots. She has not been passing tissue. Sexual activity: She denies intercourse in the last 24 hours.  Abdominal Pain Pertinent negatives include no constipation, diarrhea, dysuria, frequency, nausea or vomiting.   History reviewed. No pertinent past medical history.  History reviewed. No pertinent past surgical history.  History reviewed. No pertinent family history.  History  Substance Use Topics  . Smoking status: Current Some Day Smoker -- 0.25 packs/day    Types: Cigarettes  . Smokeless tobacco: Current User  . Alcohol Use: No    Allergies: No Known Allergies  Prescriptions prior to admission  Medication Sig Dispense Refill Last Dose  . ibuprofen (ADVIL,MOTRIN) 200 MG tablet Take 400 mg by mouth every 6 (six) hours as needed for headache (pain).     08/11/2014 at Unknown time  . Prenatal Vit-Fe Fumarate-FA (PRENATAL VITAMINS) 28-0.8 MG TABS Take 1 tablet by mouth daily. 30 tablet 5 08/12/2014 at Unknown time  . nitrofurantoin, macrocrystal-monohydrate, (MACROBID) 100 MG capsule Take 1 capsule (100 mg total) by mouth 2 (two) times daily. (Patient not taking: Reported on 08/05/2014) 10 capsule 0 Not Taking    Review of Systems  Gastrointestinal: Positive for abdominal pain (2/10). Negative for nausea, vomiting, diarrhea and constipation.  Genitourinary: Positive for vaginal bleeding. Negative for dysuria, urgency and frequency.   Physical Exam   Blood pressure 108/67, pulse 106, temperature 97.9 F (36.6 C), temperature source Oral, resp. rate 20, height 5\' 4"  (1.626 m), weight 61.236 kg (135 lb), last menstrual period 04/12/2014, SpO2 100 %.  Physical Exam  Nursing note and vitals reviewed. Constitutional: She is oriented to person, place, and time. She appears well-developed and well-nourished. No distress.  Cardiovascular: Normal rate.   Respiratory: Effort normal.  GI: Soft. There is no tenderness. There is no rebound.  Genitourinary:   External: no lesion Vagina: moderate amount of frothy, white discharge. No blood seen.  Cervix: pink, smooth, no CMT. Closed/thick/high  Uterus: AGA, FHT 148 with doppler    Neurological: She is oriented to person, place, and time.  Skin: Skin is warm and dry.  Psychiatric: She has a normal mood and affect.   Results for orders placed or performed during the hospital encounter of 08/12/14 (from the past 24 hour(s))  Urinalysis, Routine w reflex microscopic     Status: Abnormal   Collection Time:  08/12/14  8:23 PM  Result Value Ref Range   Color, Urine YELLOW YELLOW   APPearance CLEAR CLEAR   Specific Gravity, Urine 1.015 1.005 - 1.030   pH 6.5 5.0 - 8.0   Glucose, UA NEGATIVE NEGATIVE mg/dL   Hgb urine dipstick TRACE (A) NEGATIVE   Bilirubin Urine NEGATIVE NEGATIVE   Ketones, ur  NEGATIVE NEGATIVE mg/dL   Protein, ur NEGATIVE NEGATIVE mg/dL   Urobilinogen, UA 0.2 0.0 - 1.0 mg/dL   Nitrite NEGATIVE NEGATIVE   Leukocytes, UA MODERATE (A) NEGATIVE  Urine microscopic-add on     Status: None   Collection Time: 08/12/14  8:23 PM  Result Value Ref Range   Squamous Epithelial / LPF RARE RARE   WBC, UA 3-6 <3 WBC/hpf   RBC / HPF 0-2 <3 RBC/hpf   Bacteria, UA RARE RARE  Wet prep, genital     Status: Abnormal   Collection Time: 08/12/14  9:05 PM  Result Value Ref Range   Yeast Wet Prep HPF POC NONE SEEN NONE SEEN   Trich, Wet Prep FEW (A) NONE SEEN   Clue Cells Wet Prep HPF POC NONE SEEN NONE SEEN   WBC, Wet Prep HPF POC MODERATE (A) NONE SEEN    MAU Course  Procedures  MDM Wet prep UA  Assessment and Plan     ICD-9-CM ICD-10-CM   1. Pain of round ligament affecting pregnancy, antepartum 646.83 O99.89 Discharge patient   625.9 N94.9   2. Drug use affecting pregnancy in second trimester, antepartum 648.43 O99.322    305.90    3. Encounter for supervision of other normal pregnancy in second trimester V22.1 Z34.82   4. Trichomonal vaginitis during pregnancy in second trimester 646.63 O23.592 Discharge patient   131.01 A59.01     DC home Treated here in MAU with flagyl for trichomoniasis  Discussed partner treatment Return to MAU as needed 2nd trimester precautions reviewed  Follow-up Information    Follow up with [facility].   Specialty:  Obstetrics and Gynecology   Why:  As scheduled   Contact information:   [address] [place] [address] [phone]       Tawnya Crook 08/12/2014, 8:52 PM

## 2014-08-12 NOTE — MAU Note (Signed)
Pt states she saw blood when she wiped a t work it was a light pink, Pt states pain feels like a light cramp.

## 2014-08-17 NOTE — Telephone Encounter (Signed)
Patient called and left message stating she is returning our call for results. Called patient and informed her of results and need for 3 hr. Patient verbalized understanding and states she can come tomorrow morning at 8am. Patient aware to come in fasting. Patient asked about getting a letter to be excused from work because she was out of work on Thursday after her visit to the MAU and the provider told her she could miss work the next day if she needed to. Told patient we could not provide that letter it would need to come from MAU since they are the ones that saw her. Patient states oh okay work told me I was fine but I just wanted to make sure I had one. Patient asked if she was seeing a doctor tomorrow or just blood work.Told patient it was a blood work only visit. Patient verbalized understanding and had no other questions

## 2014-08-18 ENCOUNTER — Other Ambulatory Visit: Payer: Medicaid Other

## 2014-08-18 ENCOUNTER — Telehealth: Payer: Self-pay | Admitting: Family Medicine

## 2014-08-18 NOTE — Telephone Encounter (Signed)
Called patient about missed appointment. Left message to call us back to reschedule.

## 2014-08-21 ENCOUNTER — Other Ambulatory Visit: Payer: Medicaid Other

## 2014-08-25 ENCOUNTER — Telehealth: Payer: Self-pay | Admitting: *Deleted

## 2014-08-25 NOTE — Telephone Encounter (Signed)
Pt returned call to the clinic, states she has not taken any glucose test this pregnancy.  Appointment for 3 hour gtt cancelled.  Note to Dr. Adrian BlackwaterStinson and Teague-Clark.

## 2014-08-25 NOTE — Telephone Encounter (Signed)
Attempted to contact patient to inquire about 1 hour gtt, no answer, requested patient contact the clinic.  (Dr. Adrian BlackwaterStinson states he does not see a 1 hour gtt.  [facility] patient failed one hour and needs 3 hour glucose test.)

## 2014-08-26 ENCOUNTER — Ambulatory Visit (HOSPITAL_COMMUNITY)
Admission: RE | Admit: 2014-08-26 | Discharge: 2014-08-26 | Disposition: A | Discharge status: Home / Self Care | Payer: Medicaid Other | Source: Ambulatory Visit | Attending: Physician Assistant | Admitting: Physician Assistant

## 2014-08-26 DIAGNOSIS — Z3482 Encounter for supervision of other normal pregnancy, second trimester: Secondary | ICD-10-CM

## 2014-08-26 DIAGNOSIS — Z36 Encounter for antenatal screening of mother: Secondary | ICD-10-CM | POA: Insufficient documentation

## 2014-08-26 DIAGNOSIS — Z3A19 19 weeks gestation of pregnancy: Secondary | ICD-10-CM | POA: Diagnosis not present

## 2014-08-26 DIAGNOSIS — Z3689 Encounter for other specified antenatal screening: Secondary | ICD-10-CM | POA: Insufficient documentation

## 2014-08-28 ENCOUNTER — Other Ambulatory Visit: Payer: Medicaid Other

## 2014-09-02 ENCOUNTER — Encounter: Payer: Self-pay | Admitting: Physician Assistant

## 2014-09-02 ENCOUNTER — Ambulatory Visit (INDEPENDENT_AMBULATORY_CARE_PROVIDER_SITE_OTHER): Payer: Medicaid Other | Admitting: Physician Assistant

## 2014-09-02 VITALS — BP 109/65 | HR 97 | Temp 98.4°F | Wt 140.2 lb

## 2014-09-02 DIAGNOSIS — Z3482 Encounter for supervision of other normal pregnancy, second trimester: Secondary | ICD-10-CM

## 2014-09-02 LAB — POCT URINALYSIS DIP (DEVICE)
Bilirubin Urine: NEGATIVE
GLUCOSE, UA: NEGATIVE mg/dL
HGB URINE DIPSTICK: NEGATIVE
Ketones, ur: NEGATIVE mg/dL
Nitrite: NEGATIVE
Protein, ur: NEGATIVE mg/dL
SPECIFIC GRAVITY, URINE: 1.015 (ref 1.005–1.030)
Urobilinogen, UA: 1 mg/dL (ref 0.0–1.0)
pH: 7.5 (ref 5.0–8.0)

## 2014-09-02 NOTE — Progress Notes (Signed)
Leukocytes: Trace 

## 2014-09-02 NOTE — Progress Notes (Signed)
20 weeks, without complaint.  Denies LOF, VB, dysuria.  Endorses good fetal movement.   Declines marijuana use but states she is around lots of people that are smoking it.   RTC 4 weeks

## 2014-09-02 NOTE — Patient Instructions (Signed)
Prenatal Care  WHAT IS PRENATAL CARE?  Prenatal care means health care during your pregnancy, before your baby is born. It is very important to take care of yourself and your baby during your pregnancy by:   Getting early prenatal care. If you know you are pregnant, or think you might be pregnant, call your health care provider as soon as possible. Schedule a visit for a prenatal exam.  Getting regular prenatal care. Follow your health care provider's schedule for blood and other necessary tests. Do not miss appointments.  Doing everything you can to keep yourself and your baby healthy during your pregnancy.  Getting complete care. Prenatal care should include evaluation of the medical, dietary, educational, psychological, and social needs of you and your significant other. The medical and genetic history of your family and the family of your baby's father should be discussed with your health care provider.  Discussing with your health care provider:  Prescription, over-the-counter, and herbal medicines that you take.  Any history of substance abuse, alcohol use, smoking, and illegal drug use.  Any history of domestic abuse and violence.  Immunizations you have received.  Your nutrition and diet.  The amount of exercise you do.  Any environmental and occupational hazards to which you are exposed.  History of sexually transmitted infections for both you and your partner.  Previous pregnancies you have had. WHY IS PRENATAL CARE SO IMPORTANT?  By regularly seeing your health care provider, you help ensure that problems can be identified early so that they can be treated as soon as possible. Other problems might be prevented. Many studies have shown that early and regular prenatal care is important for the health of mothers and their babies.  HOW CAN I TAKE CARE OF MYSELF WHILE I AM PREGNANT?  Here are ways to take care of yourself and your baby:   Start or continue taking your  multivitamin with 400 micrograms (mcg) of folic acid every day.  Get early and regular prenatal care. It is very important to see a health care provider during your pregnancy. Your health care provider will check at each visit to make sure that you and your baby are healthy. If there are any problems, action can be taken right away to help you and your baby.  Eat a healthy diet that includes:  Fruits.  Vegetables.  Foods low in saturated fat.  Whole grains.  Calcium-rich foods, such as milk, yogurt, and hard cheeses.  Drink 6-8 glasses of liquids a day.  Unless your health care provider tells you not to, try to be physically active for 30 minutes, most days of the week. If you are pressed for time, you can get your activity in through 10-minute segments, three times a day.  Do not smoke, drink alcohol, or use drugs. These can cause long-term damage to your baby. Talk with your health care provider about steps to take to stop smoking. Talk with a member of your faith community, a counselor, a trusted friend, or your health care provider if you are concerned about your alcohol or drug use.  Ask your health care provider before taking any medicine, even over-the-counter medicines. Some medicines are not safe to take during pregnancy.  Get plenty of rest and sleep.  Avoid hot tubs and saunas during pregnancy.  Do not have X-rays taken unless absolutely necessary and with the recommendation of your health care provider. A lead shield can be placed on your abdomen to protect your baby when   X-rays are taken in other parts of your body.  Do not empty the cat litter when you are pregnant. It may contain a parasite that causes an infection called toxoplasmosis, which can cause birth defects. Also, use gloves when working in garden areas used by cats.  Do not eat uncooked or undercooked meats or fish.  Do not eat soft, mold-ripened cheeses (Brie, Camembert, and chevre) or soft, blue-veined  cheese (Danish blue and Roquefort).  Stay away from toxic chemicals like:  Insecticides.  Solvents (some cleaners or paint thinners).  Lead.  Mercury.  Sexual intercourse may continue until the end of the pregnancy, unless you have a medical problem or there is a problem with the pregnancy and your health care provider tells you not to.  Do not wear high-heel shoes, especially during the second half of the pregnancy. You can lose your balance and fall.  Do not take long trips, unless absolutely necessary. Be sure to see your health care provider before going on the trip.  Do not sit in one position for more than 2 hours when on a trip.  Take a copy of your medical records when going on a trip. Know where a hospital is located in the city you are visiting, in case of an emergency.  Most dangerous household products will have pregnancy warnings on their labels. Ask your health care provider about products if you are unsure.  Limit or eliminate your caffeine intake from coffee, tea, sodas, medicines, and chocolate.  Many women continue working through pregnancy. Staying active might help you stay healthier. If you have a question about the safety or the hours you work at your particular job, talk with your health care provider.  Get informed:  Read books.  Watch videos.  Go to childbirth classes for you and your significant other.  Talk with experienced moms.  Ask your health care provider about childbirth education classes for you and your partner. Classes can help you and your partner prepare for the birth of your baby.  Ask about a baby doctor (pediatrician) and methods and pain medicine for labor, delivery, and possible cesarean delivery. HOW OFTEN SHOULD I SEE MY HEALTH CARE PROVIDER DURING PREGNANCY?  Your health care provider will give you a schedule for your prenatal visits. You will have visits more often as you get closer to the end of your pregnancy. An average  pregnancy lasts about 40 weeks.  A typical schedule includes visiting your health care provider:   About once each month during your first 6 months of pregnancy.  Every 2 weeks during the next 2 months.  Weekly in the last month, until the delivery date. Your health care provider will probably want to see you more often if:  You are older than 35 years.  Your pregnancy is high risk because you have certain health problems or problems with the pregnancy, such as:  Diabetes.  High blood pressure.  The baby is not growing on schedule, according to the dates of the pregnancy. Your health care provider will do special tests to make sure you and your baby are not having any serious problems. WHAT HAPPENS DURING PRENATAL VISITS?   At your first prenatal visit, your health care provider will do a physical exam and talk to you about your health history and the health history of your partner and your family. Your health care provider will be able to tell you what date to expect your baby to be born on.  Your   first physical exam will include checks of your blood pressure, measurements of your height and weight, and an exam of your pelvic organs. Your health care provider will do a Pap test if you have not had one recently and will do cultures of your cervix to make sure there is no infection.  At each prenatal visit, there will be tests of your blood, urine, blood pressure, weight, and the progress of the baby will be checked.  At your later prenatal visits, your health care provider will check how you are doing and how your baby is developing. You may have a number of tests done as your pregnancy progresses.  Ultrasound exams are often used to check on your baby's growth and health.  You may have more urine and blood tests, as well as special tests, if needed. These may include amniocentesis to examine fluid in the pregnancy sac, stress tests to check how the baby responds to contractions, or a  biophysical profile to measure your baby's well-being. Your health care provider will explain the tests and why they are necessary.  You should be tested for high blood sugar (gestational diabetes) between the 24th and 28th weeks of your pregnancy.  You should discuss with your health care provider your plans to breastfeed or bottle-feed your baby.  Each visit is also a chance for you to learn about staying healthy during pregnancy and to ask questions. Document Released: 05/11/2003 Document Revised: 05/13/2013 Document Reviewed: 07/23/2013 ExitCare Patient Information 2015 [employer]. This information is not intended to replace advice given to you by your health care provider. Make sure you discuss any questions you have with your health care provider.  

## 2014-09-02 NOTE — Progress Notes (Signed)
Patient reports occasional cramps and round ligament pain

## 2014-09-09 ENCOUNTER — Emergency Department (HOSPITAL_COMMUNITY): Payer: Medicaid Other

## 2014-09-09 ENCOUNTER — Encounter (HOSPITAL_COMMUNITY): Payer: Self-pay | Admitting: Family Medicine

## 2014-09-09 ENCOUNTER — Emergency Department (HOSPITAL_COMMUNITY)
Admission: EM | Admit: 2014-09-09 | Discharge: 2014-09-09 | Disposition: A | Discharge status: Home / Self Care | Payer: Medicaid Other | Attending: Emergency Medicine | Admitting: Emergency Medicine

## 2014-09-09 DIAGNOSIS — S161XXA Strain of muscle, fascia and tendon at neck level, initial encounter: Secondary | ICD-10-CM | POA: Insufficient documentation

## 2014-09-09 DIAGNOSIS — Z3A24 24 weeks gestation of pregnancy: Secondary | ICD-10-CM | POA: Diagnosis not present

## 2014-09-09 DIAGNOSIS — Y999 Unspecified external cause status: Secondary | ICD-10-CM | POA: Insufficient documentation

## 2014-09-09 DIAGNOSIS — S1091XA Abrasion of unspecified part of neck, initial encounter: Secondary | ICD-10-CM | POA: Insufficient documentation

## 2014-09-09 DIAGNOSIS — Y929 Unspecified place or not applicable: Secondary | ICD-10-CM | POA: Insufficient documentation

## 2014-09-09 DIAGNOSIS — O99332 Smoking (tobacco) complicating pregnancy, second trimester: Secondary | ICD-10-CM | POA: Diagnosis not present

## 2014-09-09 DIAGNOSIS — F17210 Nicotine dependence, cigarettes, uncomplicated: Secondary | ICD-10-CM | POA: Diagnosis not present

## 2014-09-09 DIAGNOSIS — O9A312 Physical abuse complicating pregnancy, second trimester: Secondary | ICD-10-CM | POA: Diagnosis present

## 2014-09-09 DIAGNOSIS — Z23 Encounter for immunization: Secondary | ICD-10-CM | POA: Insufficient documentation

## 2014-09-09 DIAGNOSIS — Z3490 Encounter for supervision of normal pregnancy, unspecified, unspecified trimester: Secondary | ICD-10-CM

## 2014-09-09 DIAGNOSIS — Y939 Activity, unspecified: Secondary | ICD-10-CM | POA: Insufficient documentation

## 2014-09-09 MED ORDER — ACETAMINOPHEN 325 MG PO TABS
650.0000 mg | ORAL_TABLET | Freq: Once | ORAL | Status: AC
  Administered 2014-09-09: 650 mg via ORAL
  Filled 2014-09-09: qty 2

## 2014-09-09 MED ORDER — TETANUS-DIPHTH-ACELL PERTUSSIS 5-2.5-18.5 LF-MCG/0.5 IM SUSP
0.5000 mL | Freq: Once | INTRAMUSCULAR | Status: AC
  Administered 2014-09-09: 0.5 mL via INTRAMUSCULAR
  Filled 2014-09-09: qty 0.5
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
## 2014-09-30 ENCOUNTER — Encounter: Payer: Medicaid Other | Admitting: Family

## 2014-10-08 ENCOUNTER — Ambulatory Visit (INDEPENDENT_AMBULATORY_CARE_PROVIDER_SITE_OTHER): Payer: Medicaid Other | Admitting: Advanced Practice Midwife

## 2014-10-08 ENCOUNTER — Encounter: Payer: Self-pay | Admitting: Advanced Practice Midwife

## 2014-10-08 VITALS — BP 105/64 | HR 101 | Wt 145.1 lb

## 2014-10-08 DIAGNOSIS — Z3482 Encounter for supervision of other normal pregnancy, second trimester: Secondary | ICD-10-CM

## 2014-10-08 LAB — POCT URINALYSIS DIP (DEVICE)
BILIRUBIN URINE: NEGATIVE
GLUCOSE, UA: NEGATIVE mg/dL
KETONES UR: NEGATIVE mg/dL
NITRITE: NEGATIVE
Protein, ur: NEGATIVE mg/dL
SPECIFIC GRAVITY, URINE: 1.02 (ref 1.005–1.030)
Urobilinogen, UA: 0.2 mg/dL (ref 0.0–1.0)
pH: 6.5 (ref 5.0–8.0)

## 2014-10-08 NOTE — Progress Notes (Signed)
Subjective:    Heather Fox is a 19 y.o. G2P1001 7470w4d being seen today for her obstetrical visit.  Patient reports no complaints.  Denies contractions, vaginal bleeding or leaking of fluid.  Reports good fetal movement.  The following portions of the patient's history were reviewed and updated as appropriate: allergies, current medications, past family history, past medical history, past social history, past surgical history and problem list.   Objective:    BP 105/64 mmHg  Pulse 101  Wt 145 lb 1.6 oz (65.817 kg)  LMP 04/12/2014 (Exact Date)  Physical Exam  Exam  FHT: Fetal Heart Rate (bpm): 153  Uterine Size: Fundal Height: 25 cm  Presentation: Presentation: Undeterminable      Results for orders placed or performed in visit on 10/08/14 (from the past 24 hour(s))  POCT urinalysis dip (device)     Status: Abnormal   Collection Time: 10/08/14  1:46 PM  Result Value Ref Range   Glucose, UA NEGATIVE NEGATIVE mg/dL   Bilirubin Urine NEGATIVE NEGATIVE   Ketones, ur NEGATIVE NEGATIVE mg/dL   Specific Gravity, Urine 1.020 1.005 - 1.030   Hgb urine dipstick TRACE (A) NEGATIVE   pH 6.5 5.0 - 8.0   Protein, ur NEGATIVE NEGATIVE mg/dL   Urobilinogen, UA 0.2 0.0 - 1.0 mg/dL   Nitrite NEGATIVE NEGATIVE   Leukocytes, UA TRACE (A) NEGATIVE     Assessment:    Pregnancy:  G2P1001    Plan:    Patient Active Problem List   Diagnosis Date Noted  . Encounter for fetal anatomic survey   . Encounter for (NT) nuchal translucency scan   . Drug use affecting pregnancy in second trimester, antepartum 07/15/2014  . Supervision of normal subsequent pregnancy 07/08/2014    Reviewed symptoms of preterm labor, leaking, bleeding and decrease FM Follow up in 3 weeks for Glucola.     Heather Fox, CNM

## 2014-10-08 NOTE — Patient Instructions (Signed)
Second Trimester of Pregnancy The second trimester is from week 13 through week 28, months 4 through 6. The second trimester is often a time when you feel your best. Your body has also adjusted to being pregnant, and you begin to feel better physically. Usually, morning sickness has lessened or quit completely, you may have more energy, and you may have an increase in appetite. The second trimester is also a time when the fetus is growing rapidly. At the end of the sixth month, the fetus is about 9 inches long and weighs about 1 pounds. You will likely begin to feel the baby move (quickening) between 18 and 20 weeks of the pregnancy. BODY CHANGES Your body goes through many changes during pregnancy. The changes vary from woman to woman.   Your weight will continue to increase. You will notice your lower abdomen bulging out.  You may begin to get stretch marks on your hips, abdomen, and breasts.  You may develop headaches that can be relieved by medicines approved by your health care provider.  You may urinate more often because the fetus is pressing on your bladder.  You may develop or continue to have heartburn as a result of your pregnancy.  You may develop constipation because certain hormones are causing the muscles that push waste through your intestines to slow down.  You may develop hemorrhoids or swollen, bulging veins (varicose veins).  You may have back pain because of the weight gain and pregnancy hormones relaxing your joints between the bones in your pelvis and as a result of a shift in weight and the muscles that support your balance.  Your breasts will continue to grow and be tender.  Your gums may bleed and may be sensitive to brushing and flossing.  Dark spots or blotches (chloasma, mask of pregnancy) may develop on your face. This will likely fade after the baby is born.  A dark line from your belly button to the pubic area (linea nigra) may appear. This will likely fade  after the baby is born.  You may have changes in your hair. These can include thickening of your hair, rapid growth, and changes in texture. Some women also have hair loss during or after pregnancy, or hair that feels dry or thin. Your hair will most likely return to normal after your baby is born. WHAT TO EXPECT AT YOUR PRENATAL VISITS During a routine prenatal visit:  You will be weighed to make sure you and the fetus are growing normally.  Your blood pressure will be taken.  Your abdomen will be measured to track your baby's growth.  The fetal heartbeat will be listened to.  Any test results from the previous visit will be discussed. Your health care provider may ask you:  How you are feeling.  If you are feeling the baby move.  If you have had any abnormal symptoms, such as leaking fluid, bleeding, severe headaches, or abdominal cramping.  If you have any questions. Other tests that may be performed during your second trimester include:  Blood tests that check for:  Low iron levels (anemia).  Gestational diabetes (between 24 and 28 weeks).  Rh antibodies.  Urine tests to check for infections, diabetes, or protein in the urine.  An ultrasound to confirm the proper growth and development of the baby.  An amniocentesis to check for possible genetic problems.  Fetal screens for spina bifida and Down syndrome. HOME CARE INSTRUCTIONS   Avoid all smoking, herbs, alcohol, and unprescribed   drugs. These chemicals affect the formation and growth of the baby.  Follow your health care provider's instructions regarding medicine use. There are medicines that are either safe or unsafe to take during pregnancy.  Exercise only as directed by your health care provider. Experiencing uterine cramps is a good sign to stop exercising.  Continue to eat regular, healthy meals.  Wear a good support bra for breast tenderness.  Do not use hot tubs, steam rooms, or saunas.  Wear your  seat belt at all times when driving.  Avoid raw meat, uncooked cheese, cat litter boxes, and soil used by cats. These carry germs that can cause birth defects in the baby.  Take your prenatal vitamins.  Try taking a stool softener (if your health care provider approves) if you develop constipation. Eat more high-fiber foods, such as fresh vegetables or fruit and whole grains. Drink plenty of fluids to keep your urine clear or pale yellow.  Take warm sitz baths to soothe any pain or discomfort caused by hemorrhoids. Use hemorrhoid cream if your health care provider approves.  If you develop varicose veins, wear support hose. Elevate your feet for 15 minutes, 3-4 times a day. Limit salt in your diet.  Avoid heavy lifting, wear low heel shoes, and practice good posture.  Rest with your legs elevated if you have leg cramps or low back pain.  Visit your dentist if you have not gone yet during your pregnancy. Use a soft toothbrush to brush your teeth and be gentle when you floss.  A sexual relationship may be continued unless your health care provider directs you otherwise.  Continue to go to all your prenatal visits as directed by your health care provider. SEEK MEDICAL CARE IF:   You have dizziness.  You have mild pelvic cramps, pelvic pressure, or nagging pain in the abdominal area.  You have persistent nausea, vomiting, or diarrhea.  You have a bad smelling vaginal discharge.  You have pain with urination. SEEK IMMEDIATE MEDICAL CARE IF:   You have a fever.  You are leaking fluid from your vagina.  You have spotting or bleeding from your vagina.  You have severe abdominal cramping or pain.  You have rapid weight gain or loss.  You have shortness of breath with chest pain.  You notice sudden or extreme swelling of your face, hands, ankles, feet, or legs.  You have not felt your baby move in over an hour.  You have severe headaches that do not go away with  medicine.  You have vision changes. Document Released: 05/02/2001 Document Revised: 05/13/2013 Document Reviewed: 07/09/2012 [employer] Patient Information 2015 [employer]. This information is not intended to replace advice given to you by your health care provider. Make sure you discuss any questions you have with your health care provider.  

## 2014-10-08 NOTE — Progress Notes (Deleted)
Subjective:    Heather Fox is a 19 y.o. G2P1001 8664w4d being seen today for her obstetrical visit.  Patient reports {sx:14538}. Fetal movement: {fetal movement:14572}.  Objective:    BP 105/64 mmHg  Pulse 101  Wt 145 lb 1.6 oz (65.817 kg)  LMP 04/12/2014 (Exact Date)  Physical Exam  Exam  FHT: Fetal Heart Rate (bpm): 153  Uterine Size:    Presentation:       Assessment:    Pregnancy:  G2P1001    Plan:    Patient Active Problem List   Diagnosis Date Noted  . Encounter for fetal anatomic survey   . [redacted] weeks gestation of pregnancy   . Encounter for (NT) nuchal translucency scan   . [redacted] weeks gestation of pregnancy   . Drug use affecting pregnancy in second trimester, antepartum 07/15/2014  . Supervision of normal subsequent pregnancy 07/08/2014    {ob counseling:14517} Follow up in {follow up OB:14565}.   Results for orders placed or performed in visit on 10/08/14 (from the past 24 hour(s))  POCT urinalysis dip (device)     Status: Abnormal   Collection Time: 10/08/14  1:46 PM  Result Value Ref Range   Glucose, UA NEGATIVE NEGATIVE mg/dL   Bilirubin Urine NEGATIVE NEGATIVE   Ketones, ur NEGATIVE NEGATIVE mg/dL   Specific Gravity, Urine 1.020 1.005 - 1.030   Hgb urine dipstick TRACE (A) NEGATIVE   pH 6.5 5.0 - 8.0   Protein, ur NEGATIVE NEGATIVE mg/dL   Urobilinogen, UA 0.2 0.0 - 1.0 mg/dL   Nitrite NEGATIVE NEGATIVE   Leukocytes, UA TRACE (A) NEGATIVE

## 2014-10-29 ENCOUNTER — Ambulatory Visit (INDEPENDENT_AMBULATORY_CARE_PROVIDER_SITE_OTHER): Payer: Medicaid Other | Admitting: Family Medicine

## 2014-10-29 VITALS — BP 99/46 | HR 101 | Temp 98.7°F | Wt 145.6 lb

## 2014-10-29 DIAGNOSIS — Z3482 Encounter for supervision of other normal pregnancy, second trimester: Secondary | ICD-10-CM

## 2014-10-29 DIAGNOSIS — O360131 Maternal care for anti-D [Rh] antibodies, third trimester, fetus 1: Secondary | ICD-10-CM

## 2014-10-29 MED ORDER — RHO D IMMUNE GLOBULIN 1500 UNIT/2ML IJ SOSY
300.0000 ug | PREFILLED_SYRINGE | Freq: Once | INTRAMUSCULAR | Status: AC
  Administered 2014-10-29: 300 ug via INTRAMUSCULAR

## 2014-10-29 NOTE — Progress Notes (Signed)
Patient is 19 y.o. G2P1001 [redacted]w[redacted]d.  +FM, denies LOF, VB, contractions, vaginal discharge.  Overall feeling well. - has had some pelvic pressure, especially when baby moves - B neg => rhogam today - concerned baby not growing as she is not as big as with previous pregnancies, fundal heights and weights reviewed with pt, pt reassured

## 2014-10-29 NOTE — Progress Notes (Signed)
A lot of pelvic pain and pressure. Moderate leukocytes noted on urinalysis. Will schedule 1hour gtt and labs another day, because too late today.

## 2014-10-29 NOTE — Addendum Note (Signed)
Addended by: Gerome Apley on: 10/29/2014 03:56 PM   Modules accepted: Orders

## 2014-10-30 LAB — POCT URINALYSIS DIP (DEVICE)
Bilirubin Urine: NEGATIVE
Glucose, UA: NEGATIVE mg/dL
Hgb urine dipstick: NEGATIVE
Nitrite: NEGATIVE
PH: 7 (ref 5.0–8.0)
PROTEIN: 30 mg/dL — AB
Specific Gravity, Urine: 1.02 (ref 1.005–1.030)
UROBILINOGEN UA: 4 mg/dL — AB (ref 0.0–1.0)

## 2014-11-03 ENCOUNTER — Other Ambulatory Visit: Payer: Medicaid Other

## 2014-11-03 DIAGNOSIS — Z3483 Encounter for supervision of other normal pregnancy, third trimester: Secondary | ICD-10-CM

## 2014-11-03 LAB — CBC
HEMATOCRIT: 32.3 % — AB (ref 36.0–46.0)
HEMOGLOBIN: 10.9 g/dL — AB (ref 12.0–15.0)
MCH: 30.4 pg (ref 26.0–34.0)
MCHC: 33.7 g/dL (ref 30.0–36.0)
MCV: 90.2 fL (ref 78.0–100.0)
MPV: 13.1 fL — AB (ref 8.6–12.4)
Platelets: 155 10*3/uL (ref 150–400)
RBC: 3.58 MIL/uL — ABNORMAL LOW (ref 3.87–5.11)
RDW: 12.8 % (ref 11.5–15.5)
WBC: 11.3 10*3/uL — ABNORMAL HIGH (ref 4.0–10.5)

## 2014-11-04 LAB — GLUCOSE TOLERANCE, 1 HOUR (50G) W/O FASTING: Glucose, 1 Hour GTT: 97 mg/dL (ref 70–140)

## 2014-11-04 LAB — HIV ANTIBODY (ROUTINE TESTING W REFLEX): HIV 1&2 Ab, 4th Generation: NONREACTIVE

## 2014-11-04 LAB — RPR

## 2014-11-19 ENCOUNTER — Ambulatory Visit (INDEPENDENT_AMBULATORY_CARE_PROVIDER_SITE_OTHER): Payer: Medicaid Other | Admitting: Family Medicine

## 2014-11-19 VITALS — BP 114/68 | HR 118 | Temp 98.5°F | Wt 148.0 lb

## 2014-11-19 DIAGNOSIS — Z6791 Unspecified blood type, Rh negative: Secondary | ICD-10-CM | POA: Insufficient documentation

## 2014-11-19 DIAGNOSIS — Z3483 Encounter for supervision of other normal pregnancy, third trimester: Secondary | ICD-10-CM

## 2014-11-19 DIAGNOSIS — O26899 Other specified pregnancy related conditions, unspecified trimester: Secondary | ICD-10-CM

## 2014-11-19 DIAGNOSIS — O99322 Drug use complicating pregnancy, second trimester: Secondary | ICD-10-CM

## 2014-11-19 DIAGNOSIS — O360131 Maternal care for anti-D [Rh] antibodies, third trimester, fetus 1: Secondary | ICD-10-CM

## 2014-11-19 LAB — POCT URINALYSIS DIP (DEVICE)
Bilirubin Urine: NEGATIVE
GLUCOSE, UA: NEGATIVE mg/dL
Ketones, ur: NEGATIVE mg/dL
Nitrite: NEGATIVE
PH: 6.5 (ref 5.0–8.0)
Protein, ur: 30 mg/dL — AB
SPECIFIC GRAVITY, URINE: 1.025 (ref 1.005–1.030)
UROBILINOGEN UA: 1 mg/dL (ref 0.0–1.0)

## 2014-11-19 NOTE — Progress Notes (Signed)
Subjective:  Heather Fox is a 19 y.o. G2P1001 at [redacted]w[redacted]d being seen today for ongoing prenatal care.  Patient reports no complaints.  Contractions: Irregular.  Vag. Bleeding: None. Movement: Present. Denies leaking of fluid.   The following portions of the patient's history were reviewed and updated as appropriate: allergies, current medications, past family history, past medical history, past social history, past surgical history and problem list.   Objective:   Filed Vitals:   11/19/14 1602  BP: 114/68  Pulse: 118  Temp: 98.5 F (36.9 C)  Weight: 148 lb (67.132 kg)    Fetal Status: Fetal Heart Rate (bpm): 136 Fundal Height: 30 cm Movement: Present     General:  Alert, oriented and cooperative. Patient is in no acute distress.  Skin: Skin is warm and dry. No rash noted.   Cardiovascular: Normal heart rate noted  Respiratory: Normal respiratory effort, no problems with respiration noted  Abdomen: Soft, gravid, appropriate for gestational age. Pain/Pressure: Present     Vaginal: Vag. Bleeding: None.       Cervix: Not evaluated        Extremities: Normal range of motion.  Edema: None  Mental Status: Normal mood and affect. Normal behavior. Normal judgment and thought content.   Urinalysis: Urine Protein: 1+ Urine Glucose: Negative  Assessment and Plan:  Pregnancy: G2P1001 at [redacted]w[redacted]d   Encounter for supervision of other normal pregnancy in third trimester Continue routine prenatal care.    Preterm labor symptoms and general obstetric precautions including but not limited to vaginal bleeding, contractions, leaking of fluid and fetal movement were reviewed in detail with the patient.  Please refer to After Visit Summary for other counseling recommendations.   Return in 1 week (on 11/26/2014).   Reva Boresanya S Dolores Ewing, MD

## 2014-11-19 NOTE — Progress Notes (Signed)
Large amt of Leukocytes in Urine.

## 2014-11-19 NOTE — Patient Instructions (Signed)
Third Trimester of Pregnancy The third trimester is from week 29 through week 42, months 7 through 9. The third trimester is a time when the fetus is growing rapidly. At the end of the ninth month, the fetus is about 20 inches in length and weighs 6-10 pounds.  BODY CHANGES Your body goes through many changes during pregnancy. The changes vary from woman to woman.   Your weight will continue to increase. You can expect to gain 25-35 pounds (11-16 kg) by the end of the pregnancy.  You may begin to get stretch marks on your hips, abdomen, and breasts.  You may urinate more often because the fetus is moving lower into your pelvis and pressing on your bladder.  You may develop or continue to have heartburn as a result of your pregnancy.  You may develop constipation because certain hormones are causing the muscles that push waste through your intestines to slow down.  You may develop hemorrhoids or swollen, bulging veins (varicose veins).  You may have pelvic pain because of the weight gain and pregnancy hormones relaxing your joints between the bones in your pelvis. Backaches may result from overexertion of the muscles supporting your posture.  You may have changes in your hair. These can include thickening of your hair, rapid growth, and changes in texture. Some women also have hair loss during or after pregnancy, or hair that feels dry or thin. Your hair will most likely return to normal after your baby is born.  Your breasts will continue to grow and be tender. A yellow discharge may leak from your breasts called colostrum.  Your belly button may stick out.  You may feel short of breath because of your expanding uterus.  You may notice the fetus "dropping," or moving lower in your abdomen.  You may have a bloody mucus discharge. This usually occurs a few days to a week before labor begins.  Your cervix becomes thin and soft (effaced) near your due date. WHAT TO EXPECT AT YOUR  PRENATAL EXAMS  You will have prenatal exams every 2 weeks until week 36. Then, you will have weekly prenatal exams. During a routine prenatal visit:  You will be weighed to make sure you and the fetus are growing normally.  Your blood pressure is taken.  Your abdomen will be measured to track your baby's growth.  The fetal heartbeat will be listened to.  Any test results from the previous visit will be discussed.  You may have a cervical check near your due date to see if you have effaced. At around 36 weeks, your caregiver will check your cervix. At the same time, your caregiver will also perform a test on the secretions of the vaginal tissue. This test is to determine if a type of bacteria, Group B streptococcus, is present. Your caregiver will explain this further. Your caregiver may ask you:  What your birth plan is.  How you are feeling.  If you are feeling the baby move.  If you have had any abnormal symptoms, such as leaking fluid, bleeding, severe headaches, or abdominal cramping.  If you have any questions. Other tests or screenings that may be performed during your third trimester include:  Blood tests that check for low iron levels (anemia).  Fetal testing to check the health, activity level, and growth of the fetus. Testing is done if you have certain medical conditions or if there are problems during the pregnancy. FALSE LABOR You may feel small, irregular contractions that   eventually go away. These are called Braxton Hicks contractions, or false labor. Contractions may last for hours, days, or even weeks before true labor sets in. If contractions come at regular intervals, intensify, or become painful, it is best to be seen by your caregiver.  SIGNS OF LABOR   Menstrual-like cramps.  Contractions that are 5 minutes apart or less.  Contractions that start on the top of the uterus and spread down to the lower abdomen and back.  A sense of increased pelvic  pressure or back pain.  A watery or bloody mucus discharge that comes from the vagina. If you have any of these signs before the 37th week of pregnancy, call your caregiver right away. You need to go to the hospital to get checked immediately. HOME CARE INSTRUCTIONS   Avoid all smoking, herbs, alcohol, and unprescribed drugs. These chemicals affect the formation and growth of the baby.  Follow your caregiver's instructions regarding medicine use. There are medicines that are either safe or unsafe to take during pregnancy.  Exercise only as directed by your caregiver. Experiencing uterine cramps is a good sign to stop exercising.  Continue to eat regular, healthy meals.  Wear a good support bra for breast tenderness.  Do not use hot tubs, steam rooms, or saunas.  Wear your seat belt at all times when driving.  Avoid raw meat, uncooked cheese, cat litter boxes, and soil used by cats. These carry germs that can cause birth defects in the baby.  Take your prenatal vitamins.  Try taking a stool softener (if your caregiver approves) if you develop constipation. Eat more high-fiber foods, such as fresh vegetables or fruit and whole grains. Drink plenty of fluids to keep your urine clear or pale yellow.  Take warm sitz baths to soothe any pain or discomfort caused by hemorrhoids. Use hemorrhoid cream if your caregiver approves.  If you develop varicose veins, wear support hose. Elevate your feet for 15 minutes, 3-4 times a day. Limit salt in your diet.  Avoid heavy lifting, wear low heal shoes, and practice good posture.  Rest a lot with your legs elevated if you have leg cramps or low back pain.  Visit your dentist if you have not gone during your pregnancy. Use a soft toothbrush to brush your teeth and be gentle when you floss.  A sexual relationship may be continued unless your caregiver directs you otherwise.  Do not travel far distances unless it is absolutely necessary and only  with the approval of your caregiver.  Take prenatal classes to understand, practice, and ask questions about the labor and delivery.  Make a trial run to the hospital.  Pack your hospital bag.  Prepare the baby's nursery.  Continue to go to all your prenatal visits as directed by your caregiver. SEEK MEDICAL CARE IF:  You are unsure if you are in labor or if your water has broken.  You have dizziness.  You have mild pelvic cramps, pelvic pressure, or nagging pain in your abdominal area.  You have persistent nausea, vomiting, or diarrhea.  You have a bad smelling vaginal discharge.  You have pain with urination. SEEK IMMEDIATE MEDICAL CARE IF:   You have a fever.  You are leaking fluid from your vagina.  You have spotting or bleeding from your vagina.  You have severe abdominal cramping or pain.  You have rapid weight loss or gain.  You have shortness of breath with chest pain.  You notice sudden or extreme swelling   of your face, hands, ankles, feet, or legs.  You have not felt your baby move in over an hour.  You have severe headaches that do not go away with medicine.  You have vision changes. Document Released: 05/02/2001 Document Revised: 05/13/2013 Document Reviewed: 07/09/2012 [employer] Patient Information 2015 [employer]. This information is not intended to replace advice given to you by your health care provider. Make sure you discuss any questions you have with your health care provider.  Breastfeeding Deciding to breastfeed is one of the best choices you can make for you and your baby. A change in hormones during pregnancy causes your breast tissue to grow and increases the number and size of your milk ducts. These hormones also allow proteins, sugars, and fats from your blood supply to make breast milk in your milk-producing glands. Hormones prevent breast milk from being released before your baby is born as well as prompt milk flow after birth. Once  breastfeeding has begun, thoughts of your baby, as well as his or her sucking or crying, can stimulate the release of milk from your milk-producing glands.  BENEFITS OF BREASTFEEDING For Your Baby  Your first milk (colostrum) helps your baby's digestive system function better.   There are antibodies in your milk that help your baby fight off infections.   Your baby has a lower incidence of asthma, allergies, and sudden infant death syndrome.   The nutrients in breast milk are better for your baby than infant formulas and are designed uniquely for your baby's needs.   Breast milk improves your baby's brain development.   Your baby is less likely to develop other conditions, such as childhood obesity, asthma, or type 2 diabetes mellitus.  For You   Breastfeeding helps to create a very special bond between you and your baby.   Breastfeeding is convenient. Breast milk is always available at the correct temperature and costs nothing.   Breastfeeding helps to burn calories and helps you lose the weight gained during pregnancy.   Breastfeeding makes your uterus contract to its prepregnancy size faster and slows bleeding (lochia) after you give birth.   Breastfeeding helps to lower your risk of developing type 2 diabetes mellitus, osteoporosis, and breast or ovarian cancer later in life. SIGNS THAT YOUR BABY IS HUNGRY Early Signs of Hunger  Increased alertness or activity.  Stretching.  Movement of the head from side to side.  Movement of the head and opening of the mouth when the corner of the mouth or cheek is stroked (rooting).  Increased sucking sounds, smacking lips, cooing, sighing, or squeaking.  Hand-to-mouth movements.  Increased sucking of fingers or hands. Late Signs of Hunger  Fussing.  Intermittent crying. Extreme Signs of Hunger Signs of extreme hunger will require calming and consoling before your baby will be able to breastfeed successfully. Do not  wait for the following signs of extreme hunger to occur before you initiate breastfeeding:   Restlessness.  A loud, strong cry.   Screaming. BREASTFEEDING BASICS Breastfeeding Initiation  Find a comfortable place to sit or lie down, with your neck and back well supported.  Place a pillow or rolled up blanket under your baby to bring him or her to the level of your breast (if you are seated). Nursing pillows are specially designed to help support your arms and your baby while you breastfeed.  Make sure that your baby's abdomen is facing your abdomen.   Gently massage your breast. With your fingertips, massage from your chest   wall toward your nipple in a circular motion. This encourages milk flow. You may need to continue this action during the feeding if your milk flows slowly.  Support your breast with 4 fingers underneath and your thumb above your nipple. Make sure your fingers are well away from your nipple and your baby's mouth.   Stroke your baby's lips gently with your finger or nipple.   When your baby's mouth is open wide enough, quickly bring your baby to your breast, placing your entire nipple and as much of the colored area around your nipple (areola) as possible into your baby's mouth.   More areola should be visible above your baby's upper lip than below the lower lip.   Your baby's tongue should be between his or her lower gum and your breast.   Ensure that your baby's mouth is correctly positioned around your nipple (latched). Your baby's lips should create a seal on your breast and be turned out (everted).  It is common for your baby to suck about 2-3 minutes in order to start the flow of breast milk. Latching Teaching your baby how to latch on to your breast properly is very important. An improper latch can cause nipple pain and decreased milk supply for you and poor weight gain in your baby. Also, if your baby is not latched onto your nipple properly, he or she  may swallow some air during feeding. This can make your baby fussy. Burping your baby when you switch breasts during the feeding can help to get rid of the air. However, teaching your baby to latch on properly is still the best way to prevent fussiness from swallowing air while breastfeeding. Signs that your baby has successfully latched on to your nipple:    Silent tugging or silent sucking, without causing you pain.   Swallowing heard between every 3-4 sucks.    Muscle movement above and in front of his or her ears while sucking.  Signs that your baby has not successfully latched on to nipple:   Sucking sounds or smacking sounds from your baby while breastfeeding.  Nipple pain. If you think your baby has not latched on correctly, slip your finger into the corner of your baby's mouth to break the suction and place it between your baby's gums. Attempt breastfeeding initiation again. Signs of Successful Breastfeeding Signs from your baby:   A gradual decrease in the number of sucks or complete cessation of sucking.   Falling asleep.   Relaxation of his or her body.   Retention of a small amount of milk in his or her mouth.   Letting go of your breast by himself or herself. Signs from you:  Breasts that have increased in firmness, weight, and size 1-3 hours after feeding.   Breasts that are softer immediately after breastfeeding.  Increased milk volume, as well as a change in milk consistency and color by the fifth day of breastfeeding.   Nipples that are not sore, cracked, or bleeding. Signs That Your Baby is Getting Enough Milk  Wetting at least 3 diapers in a 24-hour period. The urine should be clear and pale yellow by age 5 days.  At least 3 stools in a 24-hour period by age 5 days. The stool should be soft and yellow.  At least 3 stools in a 24-hour period by age 7 days. The stool should be seedy and yellow.  No loss of weight greater than 10% of birth weight  during the first 3   days of age.  Average weight gain of 4-7 ounces (113-198 g) per week after age 4 days.  Consistent daily weight gain by age 5 days, without weight loss after the age of 2 weeks. After a feeding, your baby may spit up a small amount. This is common. BREASTFEEDING FREQUENCY AND DURATION Frequent feeding will help you make more milk and can prevent sore nipples and breast engorgement. Breastfeed when you feel the need to reduce the fullness of your breasts or when your baby shows signs of hunger. This is called "breastfeeding on demand." Avoid introducing a pacifier to your baby while you are working to establish breastfeeding (the first 4-6 weeks after your baby is born). After this time you may choose to use a pacifier. Research has shown that pacifier use during the first year of a baby's life decreases the risk of sudden infant death syndrome (SIDS). Allow your baby to feed on each breast as long as he or she wants. Breastfeed until your baby is finished feeding. When your baby unlatches or falls asleep while feeding from the first breast, offer the second breast. Because newborns are often sleepy in the first few weeks of life, you may need to awaken your baby to get him or her to feed. Breastfeeding times will vary from baby to baby. However, the following rules can serve as a guide to help you ensure that your baby is properly fed:  Newborns (babies 4 weeks of age or younger) may breastfeed every 1-3 hours.  Newborns should not go longer than 3 hours during the day or 5 hours during the night without breastfeeding.  You should breastfeed your baby a minimum of 8 times in a 24-hour period until you begin to introduce solid foods to your baby at around 6 months of age. BREAST MILK PUMPING Pumping and storing breast milk allows you to ensure that your baby is exclusively fed your breast milk, even at times when you are unable to breastfeed. This is especially important if you are  going back to work while you are still breastfeeding or when you are not able to be present during feedings. Your lactation consultant can give you guidelines on how long it is safe to store breast milk.  A breast pump is a machine that allows you to pump milk from your breast into a sterile bottle. The pumped breast milk can then be stored in a refrigerator or freezer. Some breast pumps are operated by hand, while others use electricity. Ask your lactation consultant which type will work best for you. Breast pumps can be purchased, but some hospitals and breastfeeding support groups lease breast pumps on a monthly basis. A lactation consultant can teach you how to hand express breast milk, if you prefer not to use a pump.  CARING FOR YOUR BREASTS WHILE YOU BREASTFEED Nipples can become dry, cracked, and sore while breastfeeding. The following recommendations can help keep your breasts moisturized and healthy:  Avoid using soap on your nipples.   Wear a supportive bra. Although not required, special nursing bras and tank tops are designed to allow access to your breasts for breastfeeding without taking off your entire bra or top. Avoid wearing underwire-style bras or extremely tight bras.  Air dry your nipples for 3-4minutes after each feeding.   Use only cotton bra pads to absorb leaked breast milk. Leaking of breast milk between feedings is normal.   Use lanolin on your nipples after breastfeeding. Lanolin helps to maintain your skin's   normal moisture barrier. If you use pure lanolin, you do not need to wash it off before feeding your baby again. Pure lanolin is not toxic to your baby. You may also hand express a few drops of breast milk and gently massage that milk into your nipples and allow the milk to air dry. In the first few weeks after giving birth, some women experience extremely full breasts (engorgement). Engorgement can make your breasts feel heavy, warm, and tender to the touch.  Engorgement peaks within 3-5 days after you give birth. The following recommendations can help ease engorgement:  Completely empty your breasts while breastfeeding or pumping. You may want to start by applying warm, moist heat (in the shower or with warm water-soaked hand towels) just before feeding or pumping. This increases circulation and helps the milk flow. If your baby does not completely empty your breasts while breastfeeding, pump any extra milk after he or she is finished.  Wear a snug bra (nursing or regular) or tank top for 1-2 days to signal your body to slightly decrease milk production.  Apply ice packs to your breasts, unless this is too uncomfortable for you.  Make sure that your baby is latched on and positioned properly while breastfeeding. If engorgement persists after 48 hours of following these recommendations, contact your health care provider or a lactation consultant. OVERALL HEALTH CARE RECOMMENDATIONS WHILE BREASTFEEDING  Eat healthy foods. Alternate between meals and snacks, eating 3 of each per day. Because what you eat affects your breast milk, some of the foods may make your baby more irritable than usual. Avoid eating these foods if you are sure that they are negatively affecting your baby.  Drink milk, fruit juice, and water to satisfy your thirst (about 10 glasses a day).   Rest often, relax, and continue to take your prenatal vitamins to prevent fatigue, stress, and anemia.  Continue breast self-awareness checks.  Avoid chewing and smoking tobacco.  Avoid alcohol and drug use. Some medicines that may be harmful to your baby can pass through breast milk. It is important to ask your health care provider before taking any medicine, including all over-the-counter and prescription medicine as well as vitamin and herbal supplements. It is possible to become pregnant while breastfeeding. If birth control is desired, ask your health care provider about options that  will be safe for your baby. SEEK MEDICAL CARE IF:   You feel like you want to stop breastfeeding or have become frustrated with breastfeeding.  You have painful breasts or nipples.  Your nipples are cracked or bleeding.  Your breasts are red, tender, or warm.  You have a swollen area on either breast.  You have a fever or chills.  You have nausea or vomiting.  You have drainage other than breast milk from your nipples.  Your breasts do not become full before feedings by the fifth day after you give birth.  You feel sad and depressed.  Your baby is too sleepy to eat well.  Your baby is having trouble sleeping.   Your baby is wetting less than 3 diapers in a 24-hour period.  Your baby has less than 3 stools in a 24-hour period.  Your baby's skin or the white part of his or her eyes becomes yellow.   Your baby is not gaining weight by 5 days of age. SEEK IMMEDIATE MEDICAL CARE IF:   Your baby is overly tired (lethargic) and does not want to wake up and feed.  Your baby   develops an unexplained fever. Document Released: 05/08/2005 Document Revised: 05/13/2013 Document Reviewed: 10/30/2012 [employer] Patient Information 2015 [employer]. This information is not intended to replace advice given to you by your health care provider. Make sure you discuss any questions you have with your health care provider.  

## 2014-11-19 NOTE — Progress Notes (Signed)
Lower abdominal pain and back pain - cramping type pain.  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  Recently moved and was unpacking and putting things away.  Is better with rest.  Is drinking a lot.

## 2014-12-07 ENCOUNTER — Ambulatory Visit (INDEPENDENT_AMBULATORY_CARE_PROVIDER_SITE_OTHER): Payer: Self-pay | Admitting: Certified Nurse Midwife

## 2014-12-07 VITALS — BP 102/89 | HR 112 | Temp 98.8°F | Wt 151.0 lb

## 2014-12-07 DIAGNOSIS — Z3403 Encounter for supervision of normal first pregnancy, third trimester: Secondary | ICD-10-CM

## 2014-12-07 LAB — POCT URINALYSIS DIP (DEVICE)
Bilirubin Urine: NEGATIVE
Glucose, UA: NEGATIVE mg/dL
Hgb urine dipstick: NEGATIVE
Ketones, ur: NEGATIVE mg/dL
Nitrite: NEGATIVE
Protein, ur: NEGATIVE mg/dL
Specific Gravity, Urine: 1.015 (ref 1.005–1.030)
Urobilinogen, UA: 0.2 mg/dL (ref 0.0–1.0)
pH: 7 (ref 5.0–8.0)

## 2014-12-07 NOTE — Progress Notes (Signed)
Breastfeeding tip of the week reviewed Leukocytes: large

## 2014-12-07 NOTE — Progress Notes (Signed)
Subjective:  Heather Fox is a 19 y.o. G2P1001 at 1313w1d being seen today for ongoing prenatal care.  Patient reports no complaints.  Contractions: Not present.  Vag. Bleeding: None. Movement: Present. Denies leaking of fluid.   The following portions of the patient's history were reviewed and updated as appropriate: allergies, current medications, past family history, past medical history, past social history, past surgical history and problem list.   Objective:   Filed Vitals:   12/07/14 1540  BP: 102/89  Pulse: 112  Temp: 98.8 F (37.1 C)  Weight: 151 lb (68.493 kg)    Fetal Status: Fetal Heart Rate (bpm): 154   Movement: Present     General:  Alert, oriented and cooperative. Patient is in no acute distress.  Skin: Skin is warm and dry. No rash noted.   Cardiovascular: Normal heart rate noted  Respiratory: Normal respiratory effort, no problems with respiration noted  Abdomen: Soft, gravid, appropriate for gestational age. Pain/Pressure: Present     Vaginal: Vag. Bleeding: None.       Cervix: Not evaluated        Extremities: Normal range of motion.  Edema: None  Mental Status: Normal mood and affect. Normal behavior. Normal judgment and thought content.   Urinalysis: Urine Protein: Negative Urine Glucose: Negative  Assessment and Plan:  Pregnancy: G2P1001 at 8013w1d  There are no diagnoses linked to this encounter. Preterm labor symptoms and general obstetric precautions including but not limited to vaginal bleeding, contractions, leaking of fluid and fetal movement were reviewed in detail with the patient. Please refer to After Visit Summary for other counseling recommendations.    Rhea PinkLori A Skyelyn Scruggs, CNM

## 2014-12-07 NOTE — Patient Instructions (Signed)
Third Trimester of Pregnancy The third trimester is from week 29 through week 42, months 7 through 9. The third trimester is a time when the fetus is growing rapidly. At the end of the ninth month, the fetus is about 20 inches in length and weighs 6-10 pounds.  BODY CHANGES Your body goes through many changes during pregnancy. The changes vary from woman to woman.   Your weight will continue to increase. You can expect to gain 25-35 pounds (11-16 kg) by the end of the pregnancy.  You may begin to get stretch marks on your hips, abdomen, and breasts.  You may urinate more often because the fetus is moving lower into your pelvis and pressing on your bladder.  You may develop or continue to have heartburn as a result of your pregnancy.  You may develop constipation because certain hormones are causing the muscles that push waste through your intestines to slow down.  You may develop hemorrhoids or swollen, bulging veins (varicose veins).  You may have pelvic pain because of the weight gain and pregnancy hormones relaxing your joints between the bones in your pelvis. Backaches may result from overexertion of the muscles supporting your posture.  You may have changes in your hair. These can include thickening of your hair, rapid growth, and changes in texture. Some women also have hair loss during or after pregnancy, or hair that feels dry or thin. Your hair will most likely return to normal after your baby is born.  Your breasts will continue to grow and be tender. A yellow discharge may leak from your breasts called colostrum.  Your belly button may stick out.  You may feel short of breath because of your expanding uterus.  You may notice the fetus "dropping," or moving lower in your abdomen.  You may have a bloody mucus discharge. This usually occurs a few days to a week before labor begins.  Your cervix becomes thin and soft (effaced) near your due date. WHAT TO EXPECT AT YOUR PRENATAL  EXAMS  You will have prenatal exams every 2 weeks until week 36. Then, you will have weekly prenatal exams. During a routine prenatal visit:  You will be weighed to make sure you and the fetus are growing normally.  Your blood pressure is taken.  Your abdomen will be measured to track your baby's growth.  The fetal heartbeat will be listened to.  Any test results from the previous visit will be discussed.  You may have a cervical check near your due date to see if you have effaced. At around 36 weeks, your caregiver will check your cervix. At the same time, your caregiver will also perform a test on the secretions of the vaginal tissue. This test is to determine if a type of bacteria, Group B streptococcus, is present. Your caregiver will explain this further. Your caregiver may ask you:  What your birth plan is.  How you are feeling.  If you are feeling the baby move.  If you have had any abnormal symptoms, such as leaking fluid, bleeding, severe headaches, or abdominal cramping.  If you have any questions. Other tests or screenings that may be performed during your third trimester include:  Blood tests that check for low iron levels (anemia).  Fetal testing to check the health, activity level, and growth of the fetus. Testing is done if you have certain medical conditions or if there are problems during the pregnancy. FALSE LABOR You may feel small, irregular contractions that   eventually go away. These are called Braxton Hicks contractions, or false labor. Contractions may last for hours, days, or even weeks before true labor sets in. If contractions come at regular intervals, intensify, or become painful, it is best to be seen by your caregiver.  SIGNS OF LABOR   Menstrual-like cramps.  Contractions that are 5 minutes apart or less.  Contractions that start on the top of the uterus and spread down to the lower abdomen and back.  A sense of increased pelvic pressure or back  pain.  A watery or bloody mucus discharge that comes from the vagina. If you have any of these signs before the 37th week of pregnancy, call your caregiver right away. You need to go to the hospital to get checked immediately. HOME CARE INSTRUCTIONS   Avoid all smoking, herbs, alcohol, and unprescribed drugs. These chemicals affect the formation and growth of the baby.  Follow your caregiver's instructions regarding medicine use. There are medicines that are either safe or unsafe to take during pregnancy.  Exercise only as directed by your caregiver. Experiencing uterine cramps is a good sign to stop exercising.  Continue to eat regular, healthy meals.  Wear a good support bra for breast tenderness.  Do not use hot tubs, steam rooms, or saunas.  Wear your seat belt at all times when driving.  Avoid raw meat, uncooked cheese, cat litter boxes, and soil used by cats. These carry germs that can cause birth defects in the baby.  Take your prenatal vitamins.  Try taking a stool softener (if your caregiver approves) if you develop constipation. Eat more high-fiber foods, such as fresh vegetables or fruit and whole grains. Drink plenty of fluids to keep your urine clear or pale yellow.  Take warm sitz baths to soothe any pain or discomfort caused by hemorrhoids. Use hemorrhoid cream if your caregiver approves.  If you develop varicose veins, wear support hose. Elevate your feet for 15 minutes, 3-4 times a day. Limit salt in your diet.  Avoid heavy lifting, wear low heal shoes, and practice good posture.  Rest a lot with your legs elevated if you have leg cramps or low back pain.  Visit your dentist if you have not gone during your pregnancy. Use a soft toothbrush to brush your teeth and be gentle when you floss.  A sexual relationship may be continued unless your caregiver directs you otherwise.  Do not travel far distances unless it is absolutely necessary and only with the approval  of your caregiver.  Take prenatal classes to understand, practice, and ask questions about the labor and delivery.  Make a trial run to the hospital.  Pack your hospital bag.  Prepare the baby's nursery.  Continue to go to all your prenatal visits as directed by your caregiver. SEEK MEDICAL CARE IF:  You are unsure if you are in labor or if your water has broken.  You have dizziness.  You have mild pelvic cramps, pelvic pressure, or nagging pain in your abdominal area.  You have persistent nausea, vomiting, or diarrhea.  You have a bad smelling vaginal discharge.  You have pain with urination. SEEK IMMEDIATE MEDICAL CARE IF:   You have a fever.  You are leaking fluid from your vagina.  You have spotting or bleeding from your vagina.  You have severe abdominal cramping or pain.  You have rapid weight loss or gain.  You have shortness of breath with chest pain.  You notice sudden or extreme swelling   of your face, hands, ankles, feet, or legs.  You have not felt your baby move in over an hour.  You have severe headaches that do not go away with medicine.  You have vision changes. Document Released: 05/02/2001 Document Revised: 05/13/2013 Document Reviewed: 07/09/2012 Exit[employer] Patient Information 2015 [employer]. This information is not intended to replace advice given to you by your health care provider. Make sure you discuss any questions you have with your health care provider.  

## 2014-12-21 ENCOUNTER — Ambulatory Visit (INDEPENDENT_AMBULATORY_CARE_PROVIDER_SITE_OTHER): Payer: Medicaid Other | Admitting: Obstetrics and Gynecology

## 2014-12-21 ENCOUNTER — Encounter: Payer: Self-pay | Admitting: Obstetrics & Gynecology

## 2014-12-21 ENCOUNTER — Other Ambulatory Visit: Payer: Self-pay | Admitting: Obstetrics and Gynecology

## 2014-12-21 VITALS — BP 102/62 | HR 93 | Temp 98.6°F | Wt 153.9 lb

## 2014-12-21 DIAGNOSIS — K219 Gastro-esophageal reflux disease without esophagitis: Secondary | ICD-10-CM | POA: Insufficient documentation

## 2014-12-21 DIAGNOSIS — Z3483 Encounter for supervision of other normal pregnancy, third trimester: Secondary | ICD-10-CM

## 2014-12-21 LAB — OB RESULTS CONSOLE GC/CHLAMYDIA
CHLAMYDIA, DNA PROBE: NEGATIVE
Gonorrhea: NEGATIVE

## 2014-12-21 LAB — POCT URINALYSIS DIP (DEVICE)
BILIRUBIN URINE: NEGATIVE
Glucose, UA: NEGATIVE mg/dL
Hgb urine dipstick: NEGATIVE
Ketones, ur: NEGATIVE mg/dL
NITRITE: NEGATIVE
PROTEIN: NEGATIVE mg/dL
SPECIFIC GRAVITY, URINE: 1.02 (ref 1.005–1.030)
UROBILINOGEN UA: 0.2 mg/dL (ref 0.0–1.0)
pH: 7 (ref 5.0–8.0)

## 2014-12-21 LAB — OB RESULTS CONSOLE GBS: GBS: POSITIVE

## 2014-12-21 MED ORDER — RANITIDINE HCL 150 MG PO TABS: 150.0000 mg | ORAL_TABLET | Freq: Two times a day (BID) | ORAL | Status: DC

## 2014-12-21 NOTE — Progress Notes (Signed)
Subjective:  Heather Fox is a 19 y.o. G2P1001 at [redacted]w[redacted]d being seen today for ongoing prenatal care.  Patient reports abdominal pressure but no contractions. Daily heartburn unrelieved with home remedies (milk). Contractions: Not present.  Vag. Bleeding: None. Movement: Present. Denies leaking of fluid.   The following portions of the patient's history were reviewed and updated as appropriate: allergies, current medications, past family history, past medical history, past social history, past surgical history and problem list.   Objective:   Filed Vitals:   12/21/14 1251  BP: 102/62  Pulse: 93  Temp: 98.6 F (37 C)  Weight: 153 lb 14.4 oz (69.809 kg)    Fetal Status: Fetal Heart Rate (bpm): 141   Movement: Present     General:  Alert, oriented and cooperative. Patient is in no acute distress.  Skin: Skin is warm and dry. No rash noted.   Cardiovascular: Normal heart rate noted  Respiratory: Normal respiratory effort, no problems with respiration noted  Abdomen: Soft, gravid, appropriate for gestational age. Pain/Pressure: Present   Vertex by leopold's  Vaginal: Vag. Bleeding: None.     normal external genitalia  Cervix: Not evaluated        Extremities: Normal range of motion.  Edema: Trace  Mental Status: Normal mood and affect. Normal behavior. Normal judgment and thought content.   Urinalysis: Urine Protein: Negative Urine Glucose: Negative  Assessment and Plan:  Pregnancy: G2P1001 at [redacted]w[redacted]d  1. Encounter for supervision of other normal pregnancy in third trimester - Culture, beta strep (group b only) - GC/Chlamydia Probe Amp - vertex - f/u one week  2. GERD - trial ranitidine 150 mg po bid  3. Rh negative - rhogam administered, has not experienced bleeding   Preterm labor symptoms and general obstetric precautions including but not limited to vaginal bleeding, contractions, leaking of fluid and fetal movement were reviewed in detail with the patient. Please  refer to After Visit Summary for other counseling recommendations.    Kathrynn Running, MD

## 2014-12-21 NOTE — Progress Notes (Signed)
Edema- feet   Pain/pressure- lower abd Educated pt on baby feeding cues

## 2014-12-22 ENCOUNTER — Encounter (HOSPITAL_COMMUNITY): Payer: Self-pay | Admitting: *Deleted

## 2014-12-22 ENCOUNTER — Inpatient Hospital Stay (HOSPITAL_COMMUNITY)
Admission: AD | Admit: 2014-12-22 | Discharge: 2014-12-22 | Disposition: A | Discharge status: Home / Self Care | Payer: Medicaid Other | Source: Ambulatory Visit | Attending: Family Medicine | Admitting: Family Medicine

## 2014-12-22 DIAGNOSIS — Z87891 Personal history of nicotine dependence: Secondary | ICD-10-CM | POA: Insufficient documentation

## 2014-12-22 DIAGNOSIS — R12 Heartburn: Secondary | ICD-10-CM | POA: Diagnosis not present

## 2014-12-22 DIAGNOSIS — Z3A36 36 weeks gestation of pregnancy: Secondary | ICD-10-CM | POA: Diagnosis not present

## 2014-12-22 DIAGNOSIS — O212 Late vomiting of pregnancy: Secondary | ICD-10-CM | POA: Insufficient documentation

## 2014-12-22 LAB — URINALYSIS, ROUTINE W REFLEX MICROSCOPIC
BILIRUBIN URINE: NEGATIVE
Glucose, UA: NEGATIVE mg/dL
HGB URINE DIPSTICK: NEGATIVE
Ketones, ur: NEGATIVE mg/dL
NITRITE: NEGATIVE
PH: 7 (ref 5.0–8.0)
Protein, ur: NEGATIVE mg/dL
Specific Gravity, Urine: 1.025 (ref 1.005–1.030)
Urobilinogen, UA: 1 mg/dL (ref 0.0–1.0)

## 2014-12-22 LAB — GC/CHLAMYDIA PROBE AMP
CT Probe RNA: NEGATIVE
GC Probe RNA: NEGATIVE

## 2014-12-22 LAB — URINE MICROSCOPIC-ADD ON

## 2014-12-22 MED ORDER — ONDANSETRON 8 MG PO TBDP
8.0000 mg | ORAL_TABLET | ORAL | Status: AC
  Administered 2014-12-22: 8 mg via ORAL
  Filled 2014-12-22: qty 1

## 2014-12-22 MED ORDER — GI COCKTAIL ~~LOC~~
30.0000 mL | Freq: Once | ORAL | Status: AC
  Administered 2014-12-22: 30 mL via ORAL
  Filled 2014-12-22: qty 30

## 2014-12-22 MED ORDER — ONDANSETRON HCL 4 MG PO TABS: 4.0000 mg | ORAL_TABLET | Freq: Three times a day (TID) | ORAL | Status: DC | PRN

## 2014-12-22 NOTE — MAU Note (Signed)
Started throwing up last night, continues this morning. Feels nauseated.  Denies diarrhea or constipation.  No one at home is sick.

## 2014-12-22 NOTE — MAU Provider Note (Signed)
History     CSN: [id number]  Arrival date and time: 12/22/14 0906   First Provider Initiated Contact with Patient 12/22/14 [phone]      Chief Complaint  Patient presents with  . Emesis   HPI  Patient is 19 y.o. [id number] [redacted]w[redacted]d here with complaints of nausea since last night. Has vomited 4 times in the last 12 hours. Denies diarrhea. Denies hematemesis. Able to keep some water down but has not been able to eat any food. States that sometimes it gets hot where she works and that makes her feel worse.   Denies any problems with N/V throughout pregnancy. Denies sick contacts, travel, or exposure to undercooked/spoiled food.   Hx of heartburn throughout pregnancy, never treated. Was prescribed ranitidine for heartburn yesterday at clinic but has not yet picked it up. States that her heartburn has been worse the past few days and that it is making her feel more nauseous.   +FM, denies LOF, VB, contractions, vaginal discharge.    OB History    Gravida Para Term Preterm AB TAB SAB Ectopic Multiple Living   2 1 1       1       History reviewed. No pertinent past medical history.  Past Surgical History  Procedure Laterality Date  . No past surgeries      Family History  Problem Relation Age of Onset  . Cancer Neg Hx   . Diabetes Neg Hx   . Heart disease Neg Hx   . Hypertension Neg Hx   . Stroke Neg Hx   . Hearing loss Neg Hx     History  Substance Use Topics  . Smoking status: Former Smoker -- 0.25 packs/day    Types: Cigarettes  . Smokeless tobacco: Never Used     Comment: quit 2015  . Alcohol Use: No    Allergies: No Known Allergies  Prescriptions prior to admission  Medication Sig Dispense Refill Last Dose  . Prenatal Vit-Fe Fumarate-FA (PRENATAL VITAMINS) 28-0.8 MG TABS Take 1 tablet by mouth daily. 30 tablet 5 12/22/2014 at Unknown time  . ranitidine (ZANTAC) 150 MG tablet Take 1 tablet (150 mg total) by mouth 2 (two) times daily. 60 tablet 2     Review of Systems   Constitutional: Negative for fever, chills and malaise/fatigue.  HENT: Negative for congestion.   Eyes: Negative for blurred vision and double vision.  Respiratory: Negative for cough and shortness of breath.   Cardiovascular: Negative for chest pain, palpitations, claudication and leg swelling.  Gastrointestinal: Positive for heartburn, nausea and vomiting. Negative for abdominal pain, diarrhea and constipation.  Genitourinary: Negative for dysuria, urgency, frequency, hematuria and flank pain.  Musculoskeletal: Negative for myalgias and back pain.  Skin: Negative for itching and rash.  Neurological: Negative for dizziness, loss of consciousness and headaches.   Physical Exam   Blood pressure 108/70, pulse 108, temperature 98.5 F (36.9 C), temperature source Oral, resp. rate 16, height 5\' 4"  (1.626 m), weight 69.673 kg (153 lb 9.6 oz), last menstrual period 04/12/2014.  Physical Exam  Constitutional: She is oriented to person, place, and time. She appears well-developed and well-nourished. No distress.  HENT:  Head: Normocephalic and atraumatic.  Mucus membranes moist  Eyes: Conjunctivae and EOM are normal.  Neck: Normal range of motion. No thyromegaly present.  Cardiovascular: Normal rate, regular rhythm and normal heart sounds.  Exam reveals no gallop and no friction rub.   No murmur heard. Respiratory: Breath sounds normal. No respiratory distress. She  has no wheezes. She has no rales.  GI: Soft. Bowel sounds are normal. She exhibits no distension. There is no tenderness. There is no rebound and no guarding.  Musculoskeletal: Normal range of motion. She exhibits no edema.  Neurological: She is alert and oriented to person, place, and time.  Skin: Skin is warm and dry. No rash noted. No erythema.  Psychiatric: She has a normal mood and affect. Her behavior is normal.   FHR: baseline 125, good variability, + accels, no decels Toco: no UC  MAU Course  Procedures  MDM UA  collected. GI cocktail and zofran given.   Assessment and Plan  Patient is 19 y.o. G2P1001 [redacted]w[redacted]d reporting nausea/vomtiting likely secondary to untreated heartburn.  - fetal kick counts reinforced - preterm labor precautions -GI cocktail given as suspect N/V is from untreated, long-term heartburn and zofran given for nausea -patient felt much better after medications -told to pick up her zantac and to start taking that -sent with Rx for zofran  -stable for discharge home   De Hollingshead 12/22/2014, 9:58 AM   CNM attestation:  I have seen and examined this patient; I agree with above documentation in the resident's note.   Aura MAFALDA MCGINNISS is a 19 y.o. G2P1001 reporting n/v worsened in last few days by heartburn.   +FM, denies LOF, VB, contractions, vaginal discharge.  PE: BP 97/62 mmHg  Pulse 85  Temp(Src) 98.5 F (36.9 C) (Oral)  Resp 18  Ht  (1.626 m)  Wt 69.673 kg (153 lb 9.6 oz)  BMI 26.35 kg/m2  LMP 04/12/2014 (Exact Date) Gen: calm comfortable, NAD Resp: normal effort, no distress Abd: gravid  ROS, labs, PMH reviewed NST reactive   Plan: D/C home Fetal kick counts reinforced, Term labor precautions Rx for Zofran Continue routine follow up in OB clinic Return to MAU as needed for emergencies  LEFTWICH-KIRBY, Kaydra Borgen, CNM 1:50 PM

## 2014-12-22 NOTE — Discharge Instructions (Signed)
Heartburn During Pregnancy  °Heartburn is a burning sensation in the chest caused by stomach acid backing up into the esophagus. Heartburn is common in pregnancy because a certain hormone (progesterone) is released when a woman is pregnant. The progesterone hormone may relax the valve that separates the esophagus from the stomach. This allows acid to go up into the esophagus, causing heartburn. Heartburn may also happen in pregnancy because the enlarging uterus pushes up on the stomach, which pushes more acid into the esophagus. This is especially true in the later stages of pregnancy. Heartburn problems usually go away after giving birth. °CAUSES  °Heartburn is caused by stomach acid backing up into the esophagus. During pregnancy, this may result from various things, including:  °· The progesterone hormone. °· Changing hormone levels. °· The growing uterus pushing stomach acid upward. °· Large meals. °· Certain foods and drinks. °· Exercise. °· Increased acid production. °SIGNS AND SYMPTOMS  °· Burning pain in the chest or lower throat. °· Bitter taste in the mouth. °· Coughing. °DIAGNOSIS  °Your health care provider will typically diagnose heartburn by taking a careful history of your concern. Blood tests may be done to check for a certain type of bacteria that is associated with heartburn. Sometimes, heartburn is diagnosed by prescribing a heartburn medicine to see if the symptoms improve. In some cases, a procedure called an endoscopy may be done. In this procedure, a tube with a light and a camera on the end (endoscope) is used to examine the esophagus and the stomach. °TREATMENT  °Treatment will vary depending on the severity of your symptoms. Your health care provider may recommend: °· Over-the-counter medicines (antacids, acid reducers) for mild heartburn. °· Prescription medicines to decrease stomach acid or to protect your stomach lining. °· Certain changes in your diet. °· Elevating the head of your bed  by putting blocks under the legs. This helps prevent stomach acid from backing up into the esophagus when you are lying down. °HOME CARE INSTRUCTIONS  °· Only take over-the-counter or prescription medicines as directed by your health care provider. °· Raise the head of your bed by putting blocks under the legs if instructed to do so by your health care provider. Sleeping with more pillows is not effective because it only changes the position of your head. °· Do not exercise right after eating. °· Avoid eating 2-3 hours before bed. Do not lie down right after eating. °· Eat small meals throughout the day instead of three large meals. °· Identify foods and beverages that make your symptoms worse and avoid them. Foods you may want to avoid include: °¨ Peppers. °¨ Chocolate. °¨ High-fat foods, including fried foods. °¨ Spicy foods. °¨ Garlic and onions. °¨ Citrus fruits, including oranges, grapefruit, lemons, and limes. °¨ Food containing tomatoes or tomato products. °¨ Mint. °¨ Carbonated and caffeinated drinks. °¨ Vinegar. °SEEK MEDICAL CARE IF: °· You have abdominal pain of any kind. °· You feel burning in your upper abdomen or chest, especially after eating or lying down. °· You have nausea and vomiting. °· Your stomach feels upset after you eat. °SEEK IMMEDIATE MEDICAL CARE IF:  °· You have severe chest pain that goes down your arm or into your jaw or neck. °· You feel sweaty, dizzy, or light-headed. °· You become short of breath. °· You vomit blood. °· You have difficulty or pain with swallowing. °· You have bloody or black, tarry stools. °· You have episodes of heartburn more than 3 times a   week, for more than 2 weeks. °MAKE SURE YOU: °· Understand these instructions. °· Will watch your condition. °· Will get help right away if you are not doing well or get worse. °Document Released: 05/05/2000 Document Revised: 05/13/2013 Document Reviewed: 12/25/2012 °[employer]® Patient Information ©2015 [employer]. This  information is not intended to replace advice given to you by your health care provider. Make sure you discuss any questions you have with your health care provider. ° °

## 2014-12-23 LAB — CULTURE, BETA STREP (GROUP B ONLY)

## 2014-12-24 LAB — CULTURE, OB URINE

## 2014-12-29 ENCOUNTER — Encounter: Payer: Self-pay | Admitting: Obstetrics and Gynecology

## 2014-12-29 ENCOUNTER — Ambulatory Visit (INDEPENDENT_AMBULATORY_CARE_PROVIDER_SITE_OTHER): Payer: Medicaid Other | Admitting: Obstetrics and Gynecology

## 2014-12-29 VITALS — BP 98/60 | HR 102 | Temp 98.3°F | Wt 154.2 lb

## 2014-12-29 DIAGNOSIS — F1290 Cannabis use, unspecified, uncomplicated: Secondary | ICD-10-CM | POA: Diagnosis not present

## 2014-12-29 DIAGNOSIS — O360130 Maternal care for anti-D [Rh] antibodies, third trimester, not applicable or unspecified: Secondary | ICD-10-CM | POA: Diagnosis not present

## 2014-12-29 DIAGNOSIS — O99322 Drug use complicating pregnancy, second trimester: Secondary | ICD-10-CM

## 2014-12-29 DIAGNOSIS — O99323 Drug use complicating pregnancy, third trimester: Secondary | ICD-10-CM

## 2014-12-29 DIAGNOSIS — O360131 Maternal care for anti-D [Rh] antibodies, third trimester, fetus 1: Secondary | ICD-10-CM

## 2014-12-29 LAB — POCT URINALYSIS DIP (DEVICE)
BILIRUBIN URINE: NEGATIVE
Glucose, UA: NEGATIVE mg/dL
Ketones, ur: NEGATIVE mg/dL
Nitrite: NEGATIVE
PH: 7 (ref 5.0–8.0)
Protein, ur: 30 mg/dL — AB
Specific Gravity, Urine: 1.02 (ref 1.005–1.030)
UROBILINOGEN UA: 1 mg/dL (ref 0.0–1.0)

## 2014-12-29 NOTE — Patient Instructions (Signed)
Contraception Choices Contraception (birth control) is the use of any methods or devices to prevent pregnancy. Below are some methods to help avoid pregnancy. HORMONAL METHODS   Contraceptive implant. This is a thin, plastic tube containing progesterone hormone. It does not contain estrogen hormone. Your health care provider inserts the tube in the inner part of the upper arm. The tube can remain in place for up to 3 years. After 3 years, the implant must be removed. The implant prevents the ovaries from releasing an egg (ovulation), thickens the cervical mucus to prevent sperm from entering the uterus, and thins the lining of the inside of the uterus.  Progesterone-only injections. These injections are given every 3 months by your health care provider to prevent pregnancy. This synthetic progesterone hormone stops the ovaries from releasing eggs. It also thickens cervical mucus and changes the uterine lining. This makes it harder for sperm to survive in the uterus.  Birth control pills. These pills contain estrogen and progesterone hormone. They work by preventing the ovaries from releasing eggs (ovulation). They also cause the cervical mucus to thicken, preventing the sperm from entering the uterus. Birth control pills are prescribed by a health care provider.Birth control pills can also be used to treat heavy periods.  Minipill. This type of birth control pill contains only the progesterone hormone. They are taken every day of each month and must be prescribed by your health care provider.  Birth control patch. The patch contains hormones similar to those in birth control pills. It must be changed once a week and is prescribed by a health care provider.  Vaginal ring. The ring contains hormones similar to those in birth control pills. It is left in the vagina for 3 weeks, removed for 1 week, and then a new one is put back in place. The patient must be comfortable inserting and removing the ring  from the vagina.A health care provider's prescription is necessary.  Emergency contraception. Emergency contraceptives prevent pregnancy after unprotected sexual intercourse. This pill can be taken right after sex or up to 5 days after unprotected sex. It is most effective the sooner you take the pills after having sexual intercourse. Most emergency contraceptive pills are available without a prescription. Check with your pharmacist. Do not use emergency contraception as your only form of birth control. BARRIER METHODS   Female condom. This is a thin sheath (latex or rubber) that is worn over the penis during sexual intercourse. It can be used with spermicide to increase effectiveness.  Female condom. This is a soft, loose-fitting sheath that is put into the vagina before sexual intercourse.  Diaphragm. This is a soft, latex, dome-shaped barrier that must be fitted by a health care provider. It is inserted into the vagina, along with a spermicidal jelly. It is inserted before intercourse. The diaphragm should be left in the vagina for 6 to 8 hours after intercourse.  Cervical cap. This is a round, soft, latex or plastic cup that fits over the cervix and must be fitted by a health care provider. The cap can be left in place for up to 48 hours after intercourse.  Sponge. This is a soft, circular piece of polyurethane foam. The sponge has spermicide in it. It is inserted into the vagina after wetting it and before sexual intercourse.  Spermicides. These are chemicals that kill or block sperm from entering the cervix and uterus. They come in the form of creams, jellies, suppositories, foam, or tablets. They do not require a   prescription. They are inserted into the vagina with an applicator before having sexual intercourse. The process must be repeated every time you have sexual intercourse. INTRAUTERINE CONTRACEPTION  Intrauterine device (IUD). This is a T-shaped device that is put in a woman's uterus  during a menstrual period to prevent pregnancy. There are 2 types:  Copper IUD. This type of IUD is wrapped in copper wire and is placed inside the uterus. Copper makes the uterus and fallopian tubes produce a fluid that kills sperm. It can stay in place for 10 years.  Hormone IUD. This type of IUD contains the hormone progestin (synthetic progesterone). The hormone thickens the cervical mucus and prevents sperm from entering the uterus, and it also thins the uterine lining to prevent implantation of a fertilized egg. The hormone can weaken or kill the sperm that get into the uterus. It can stay in place for 3-5 years, depending on which type of IUD is used. PERMANENT METHODS OF CONTRACEPTION  Female tubal ligation. This is when the woman's fallopian tubes are surgically sealed, tied, or blocked to prevent the egg from traveling to the uterus.  Hysteroscopic sterilization. This involves placing a small coil or insert into each fallopian tube. Your doctor uses a technique called hysteroscopy to do the procedure. The device causes scar tissue to form. This results in permanent blockage of the fallopian tubes, so the sperm cannot fertilize the egg. It takes about 3 months after the procedure for the tubes to become blocked. You must use another form of birth control for these 3 months.  Female sterilization. This is when the female has the tubes that carry sperm tied off (vasectomy).This blocks sperm from entering the vagina during sexual intercourse. After the procedure, the man can still ejaculate fluid (semen). NATURAL PLANNING METHODS  Natural family planning. This is not having sexual intercourse or using a barrier method (condom, diaphragm, cervical cap) on days the woman could become pregnant.  Calendar method. This is keeping track of the length of each menstrual cycle and identifying when you are fertile.  Ovulation method. This is avoiding sexual intercourse during ovulation.  Symptothermal  method. This is avoiding sexual intercourse during ovulation, using a thermometer and ovulation symptoms.  Post-ovulation method. This is timing sexual intercourse after you have ovulated. Regardless of which type or method of contraception you choose, it is important that you use condoms to protect against the transmission of sexually transmitted infections (STIs). Talk with your health care provider about which form of contraception is most appropriate for you. Document Released: 05/08/2005 Document Revised: 05/13/2013 Document Reviewed: 10/31/2012 Exit[employer] Patient Information 2015 [employer]. This information is not intended to replace advice given to you by your health care provider. Make sure you discuss any questions you have with your health care provider.  

## 2014-12-29 NOTE — Progress Notes (Signed)
Pain- "cramps in upper thighs when walking" Contractions every other hour but mostly when she is walking; pt requests a cervical check

## 2014-12-29 NOTE — Progress Notes (Signed)
Subjective:  Michalene ALAYIAH FONTES is a 19 y.o. G2P1001 at [redacted]w[redacted]d being seen today for ongoing prenatal care.  Patient reports fatigue. Still working FT.Still unsure about contraception and plans to breast and bottle feed.   Contractions: Irregular.  Vag. Bleeding: None. Movement: Present. Denies leaking of fluid. No UTI sx. No longer using marijuana.  The following portions of the patient's history were reviewed and updated as appropriate: allergies, current medications, past family history, past medical history, past social history, past surgical history and problem list.   Objective:   Filed Vitals:   12/29/14 0822  BP: 98/60  Pulse: 102  Temp: 98.3 F (36.8 C)  Weight: 154 lb 3.2 oz (69.945 kg)    Fetal Status: Fetal Heart Rate (bpm): 139   Movement: Present     General:  Alert, oriented and cooperative. Patient is in no acute distress.  Skin: Skin is warm and dry. No rash noted.   Cardiovascular: Normal heart rate noted  Respiratory: Normal respiratory effort, no problems with respiration noted  Abdomen: Soft, gravid, appropriate for gestational age. Pain/Pressure: Present     Pelvic: Vag. Bleeding: None     Cervical exam performed        Extremities: Normal range of motion.  Edema: None  Mental Status: Normal mood and affect. Normal behavior. Normal judgment and thought content.   Urinalysis: Urine Protein: 1+ Urine Glucose: Negative  Assessment and Plan:  Pregnancy: G2P1001 at [redacted]w[redacted]d  1. Rh negative state in antepartum period, first trimester, not applicable or unspecified fetus UDS sent Term labor symptoms and general obstetric precautions including but not limited to vaginal bleeding, contractions, leaking of fluid and fetal movement were reviewed in detail with the patient. Please refer to After Visit Summary for other counseling recommendations.  Return in about 1 week (around 01/05/2015).   Danae Orleans, CNM

## 2015-01-07 ENCOUNTER — Ambulatory Visit (INDEPENDENT_AMBULATORY_CARE_PROVIDER_SITE_OTHER): Payer: Medicaid Other | Admitting: Advanced Practice Midwife

## 2015-01-07 ENCOUNTER — Encounter: Payer: Self-pay | Admitting: Advanced Practice Midwife

## 2015-01-07 VITALS — BP 112/66 | HR 111 | Temp 99.1°F | Wt 157.1 lb

## 2015-01-07 DIAGNOSIS — Z3483 Encounter for supervision of other normal pregnancy, third trimester: Secondary | ICD-10-CM | POA: Diagnosis not present

## 2015-01-07 LAB — POCT URINALYSIS DIP (DEVICE)
Bilirubin Urine: NEGATIVE
Glucose, UA: NEGATIVE mg/dL
HGB URINE DIPSTICK: NEGATIVE
KETONES UR: NEGATIVE mg/dL
NITRITE: NEGATIVE
PH: 6 (ref 5.0–8.0)
Protein, ur: NEGATIVE mg/dL
Specific Gravity, Urine: >=1.03 (ref 1.005–1.030)
Urobilinogen, UA: 1 mg/dL (ref 0.0–1.0)

## 2015-01-07 NOTE — Progress Notes (Signed)
Subjective:  Heather Fox is a 19 y.o. G2P1001 at [redacted]w[redacted]d being seen today for ongoing prenatal care.  Patient reports no bleeding and occasional contractions.  Contractions: Irregular.  Vag. Bleeding: None. Movement: Present. Denies leaking of fluid.   The following portions of the patient's history were reviewed and updated as appropriate: allergies, current medications, past family history, past medical history, past social history, past surgical history and problem list.   Objective:   Filed Vitals:   01/07/15 1438  BP: 112/66  Pulse: 111  Temp: 99.1 F (37.3 C)  Weight: 157 lb 1.6 oz (71.26 kg)    Fetal Status: Fetal Heart Rate (bpm): 140   Movement: Present     General:  Alert, oriented and cooperative. Patient is in no acute distress.  Skin: Skin is warm and dry. No rash noted.   Cardiovascular: Normal heart rate noted  Respiratory: Normal respiratory effort, no problems with respiration noted  Abdomen: Soft, gravid, appropriate for gestational age. Pain/Pressure: Present     Pelvic: Vag. Bleeding: None     Cervical exam performed       3cm/80/-2/vertex   Extremities: Normal range of motion.  Edema: Mild pitting, slight indentation  Mental Status: Normal mood and affect. Normal behavior. Normal judgment and thought content.   Urinalysis: Urine Protein: Negative Urine Glucose: Negative  Assessment and Plan:  Pregnancy: G2P1001 at [redacted]w[redacted]d  There are no diagnoses linked to this encounter. Term labor symptoms and general obstetric precautions including but not limited to vaginal bleeding, contractions, leaking of fluid and fetal movement were reviewed in detail with the patient. Please refer to After Visit Summary for other counseling recommendations.   Return in one week if not delivered  Aviva Signs, CNM

## 2015-01-07 NOTE — Patient Instructions (Signed)

## 2015-01-11 ENCOUNTER — Inpatient Hospital Stay (HOSPITAL_COMMUNITY)
Admission: AD | Admit: 2015-01-11 | Discharge: 2015-01-12 | DRG: 775 | Disposition: A | Discharge status: Home / Self Care | Payer: Medicaid Other | Source: Ambulatory Visit | Attending: Obstetrics and Gynecology | Admitting: Obstetrics and Gynecology

## 2015-01-11 ENCOUNTER — Inpatient Hospital Stay (HOSPITAL_COMMUNITY): Payer: Medicaid Other | Admitting: Anesthesiology

## 2015-01-11 ENCOUNTER — Encounter (HOSPITAL_COMMUNITY): Payer: Self-pay | Admitting: *Deleted

## 2015-01-11 DIAGNOSIS — Z87891 Personal history of nicotine dependence: Secondary | ICD-10-CM | POA: Diagnosis not present

## 2015-01-11 DIAGNOSIS — O99322 Drug use complicating pregnancy, second trimester: Secondary | ICD-10-CM

## 2015-01-11 DIAGNOSIS — Z3A39 39 weeks gestation of pregnancy: Secondary | ICD-10-CM | POA: Diagnosis not present

## 2015-01-11 DIAGNOSIS — IMO0001 Reserved for inherently not codable concepts without codable children: Secondary | ICD-10-CM

## 2015-01-11 DIAGNOSIS — Z3483 Encounter for supervision of other normal pregnancy, third trimester: Secondary | ICD-10-CM

## 2015-01-11 DIAGNOSIS — O9962 Diseases of the digestive system complicating childbirth: Secondary | ICD-10-CM | POA: Diagnosis present

## 2015-01-11 DIAGNOSIS — O360930 Maternal care for other rhesus isoimmunization, third trimester, not applicable or unspecified: Secondary | ICD-10-CM

## 2015-01-11 DIAGNOSIS — O26893 Other specified pregnancy related conditions, third trimester: Secondary | ICD-10-CM | POA: Diagnosis present

## 2015-01-11 DIAGNOSIS — K219 Gastro-esophageal reflux disease without esophagitis: Secondary | ICD-10-CM | POA: Diagnosis present

## 2015-01-11 DIAGNOSIS — O99824 Streptococcus B carrier state complicating childbirth: Secondary | ICD-10-CM | POA: Diagnosis not present

## 2015-01-11 DIAGNOSIS — Z6791 Unspecified blood type, Rh negative: Secondary | ICD-10-CM

## 2015-01-11 DIAGNOSIS — O360131 Maternal care for anti-D [Rh] antibodies, third trimester, fetus 1: Secondary | ICD-10-CM

## 2015-01-11 LAB — CBC
HCT: 33.8 % — ABNORMAL LOW (ref 36.0–46.0)
HEMATOCRIT: 32.2 % — AB (ref 36.0–46.0)
HEMOGLOBIN: 10.5 g/dL — AB (ref 12.0–15.0)
Hemoglobin: 11.1 g/dL — ABNORMAL LOW (ref 12.0–15.0)
MCH: 30.2 pg (ref 26.0–34.0)
MCH: 29.9 pg (ref 26.0–34.0)
MCHC: 32.8 g/dL (ref 30.0–36.0)
MCHC: 32.6 g/dL (ref 30.0–36.0)
MCV: 91.8 fL (ref 78.0–100.0)
MCV: 91.7 fL (ref 78.0–100.0)
PLATELETS: 114 10*3/uL — AB (ref 150–400)
Platelets: 120 10*3/uL — ABNORMAL LOW (ref 150–400)
RBC: 3.68 MIL/uL — AB (ref 3.87–5.11)
RBC: 3.51 MIL/uL — ABNORMAL LOW (ref 3.87–5.11)
RDW: 14.3 % (ref 11.5–15.5)
RDW: 14.2 % (ref 11.5–15.5)
WBC: 10.4 10*3/uL (ref 4.0–10.5)
WBC: 12.5 10*3/uL — ABNORMAL HIGH (ref 4.0–10.5)

## 2015-01-11 LAB — RPR: RPR Ser Ql: NONREACTIVE

## 2015-01-11 MED ORDER — SIMETHICONE 80 MG PO CHEW: 80.0000 mg | CHEWABLE_TABLET | ORAL | Status: DC | PRN

## 2015-01-11 MED ORDER — OXYTOCIN 40 UNITS IN LACTATED RINGERS INFUSION - SIMPLE MED
62.5000 mL/h | INTRAVENOUS | Status: DC
  Filled 2015-01-11: qty 1000

## 2015-01-11 MED ORDER — PENICILLIN G POTASSIUM 5000000 UNITS IJ SOLR
2.5000 10*6.[IU] | INTRAVENOUS | Status: DC
  Administered 2015-01-11 (×2): 2.5 10*6.[IU] via INTRAVENOUS
  Filled 2015-01-11 (×5): qty 2.5

## 2015-01-11 MED ORDER — PRENATAL MULTIVITAMIN CH
1.0000 | ORAL_TABLET | Freq: Every day | ORAL | Status: DC
  Administered 2015-01-12: 1 via ORAL
  Filled 2015-01-11: qty 1

## 2015-01-11 MED ORDER — ACETAMINOPHEN 325 MG PO TABS: 650.0000 mg | ORAL_TABLET | ORAL | Status: DC | PRN

## 2015-01-11 MED ORDER — ONDANSETRON HCL 4 MG/2ML IJ SOLN: 4.0000 mg | Freq: Four times a day (QID) | INTRAMUSCULAR | Status: DC | PRN

## 2015-01-11 MED ORDER — DIPHENHYDRAMINE HCL 50 MG/ML IJ SOLN: 12.5000 mg | INTRAMUSCULAR | Status: DC | PRN

## 2015-01-11 MED ORDER — PENICILLIN G POTASSIUM 5000000 UNITS IJ SOLR
5.0000 10*6.[IU] | Freq: Once | INTRAVENOUS | Status: AC
  Administered 2015-01-11: 5 10*6.[IU] via INTRAVENOUS
  Filled 2015-01-11: qty 5

## 2015-01-11 MED ORDER — PHENYLEPHRINE 40 MCG/ML (10ML) SYRINGE FOR IV PUSH (FOR BLOOD PRESSURE SUPPORT)
80.0000 ug | PREFILLED_SYRINGE | INTRAVENOUS | Status: DC | PRN
  Filled 2015-01-11: qty 2
  Filled 2015-01-11: qty 20

## 2015-01-11 MED ORDER — LIDOCAINE HCL (PF) 1 % IJ SOLN
30.0000 mL | INTRAMUSCULAR | Status: DC | PRN
  Filled 2015-01-11: qty 30

## 2015-01-11 MED ORDER — CITRIC ACID-SODIUM CITRATE 334-500 MG/5ML PO SOLN
30.0000 mL | ORAL | Status: DC | PRN
Start: 2015-01-11 — End: 2015-01-11
  Administered 2015-01-11: 30 mL via ORAL
  Filled 2015-01-11: qty 15

## 2015-01-11 MED ORDER — OXYCODONE-ACETAMINOPHEN 5-325 MG PO TABS
1.0000 | ORAL_TABLET | ORAL | Status: DC | PRN
  Administered 2015-01-12 (×2): 1 via ORAL
  Filled 2015-01-11 (×2): qty 1

## 2015-01-11 MED ORDER — WITCH HAZEL-GLYCERIN EX PADS: 1.0000 "application " | MEDICATED_PAD | CUTANEOUS | Status: DC | PRN

## 2015-01-11 MED ORDER — LIDOCAINE HCL (PF) 1 % IJ SOLN
INTRAMUSCULAR | Status: DC | PRN
  Administered 2015-01-11 (×2): 4 mL via EPIDURAL

## 2015-01-11 MED ORDER — IBUPROFEN 600 MG PO TABS
600.0000 mg | ORAL_TABLET | Freq: Four times a day (QID) | ORAL | Status: DC
  Administered 2015-01-11 – 2015-01-12 (×3): 600 mg via ORAL
  Filled 2015-01-11 (×5): qty 1

## 2015-01-11 MED ORDER — ONDANSETRON HCL 4 MG/2ML IJ SOLN: 4.0000 mg | INTRAMUSCULAR | Status: DC | PRN

## 2015-01-11 MED ORDER — ONDANSETRON HCL 4 MG PO TABS: 4.0000 mg | ORAL_TABLET | ORAL | Status: DC | PRN

## 2015-01-11 MED ORDER — LACTATED RINGERS IV SOLN
500.0000 mL | INTRAVENOUS | Status: DC | PRN
Start: 2015-01-11 — End: 2015-01-11
  Administered 2015-01-11: 500 mL via INTRAVENOUS

## 2015-01-11 MED ORDER — OXYCODONE-ACETAMINOPHEN 5-325 MG PO TABS: 1.0000 | ORAL_TABLET | ORAL | Status: DC | PRN

## 2015-01-11 MED ORDER — OXYTOCIN 40 UNITS IN LACTATED RINGERS INFUSION - SIMPLE MED: 62.5000 mL/h | INTRAVENOUS | Status: DC | PRN

## 2015-01-11 MED ORDER — EPHEDRINE 5 MG/ML INJ
10.0000 mg | INTRAVENOUS | Status: DC | PRN
  Filled 2015-01-11: qty 2

## 2015-01-11 MED ORDER — FENTANYL CITRATE (PF) 100 MCG/2ML IJ SOLN: 100.0000 ug | INTRAMUSCULAR | Status: DC | PRN

## 2015-01-11 MED ORDER — BENZOCAINE-MENTHOL 20-0.5 % EX AERO: 1.0000 "application " | INHALATION_SPRAY | CUTANEOUS | Status: DC | PRN

## 2015-01-11 MED ORDER — DIPHENHYDRAMINE HCL 25 MG PO CAPS: 25.0000 mg | ORAL_CAPSULE | Freq: Four times a day (QID) | ORAL | Status: DC | PRN

## 2015-01-11 MED ORDER — FENTANYL 2.5 MCG/ML BUPIVACAINE 1/10 % EPIDURAL INFUSION (WH - ANES): 14.0000 mL/h | INTRAMUSCULAR | Status: DC | PRN

## 2015-01-11 MED ORDER — DIBUCAINE 1 % RE OINT: 1.0000 "application " | TOPICAL_OINTMENT | RECTAL | Status: DC | PRN

## 2015-01-11 MED ORDER — OXYCODONE-ACETAMINOPHEN 5-325 MG PO TABS: 2.0000 | ORAL_TABLET | ORAL | Status: DC | PRN

## 2015-01-11 MED ORDER — LANOLIN HYDROUS EX OINT
TOPICAL_OINTMENT | CUTANEOUS | Status: DC | PRN
Start: 2015-01-11 — End: 2015-01-12

## 2015-01-11 MED ORDER — DOCUSATE SODIUM 100 MG PO CAPS
100.0000 mg | ORAL_CAPSULE | Freq: Two times a day (BID) | ORAL | Status: DC
  Administered 2015-01-12: 100 mg via ORAL
  Filled 2015-01-11 (×2): qty 1

## 2015-01-11 MED ORDER — PHENYLEPHRINE 40 MCG/ML (10ML) SYRINGE FOR IV PUSH (FOR BLOOD PRESSURE SUPPORT): 80.0000 ug | PREFILLED_SYRINGE | INTRAVENOUS | Status: DC | PRN

## 2015-01-11 MED ORDER — OXYTOCIN BOLUS FROM INFUSION
500.0000 mL | INTRAVENOUS | Status: DC
  Administered 2015-01-11: 500 mL via INTRAVENOUS

## 2015-01-11 MED ORDER — FENTANYL 2.5 MCG/ML BUPIVACAINE 1/10 % EPIDURAL INFUSION (WH - ANES)
14.0000 mL/h | INTRAMUSCULAR | Status: DC | PRN
  Administered 2015-01-11 (×2): 14 mL/h via EPIDURAL
  Filled 2015-01-11: qty 125

## 2015-01-11 MED ORDER — LACTATED RINGERS IV SOLN
INTRAVENOUS | Status: DC
  Administered 2015-01-11 (×2): via INTRAVENOUS

## 2015-01-11 NOTE — Progress Notes (Signed)
Repeat CBC obtained per Dr Malen Gauze. plts 120,000; OK to removed catheter per anesthesia. Catheter removed and intact.

## 2015-01-11 NOTE — MAU Note (Signed)
Pt states that she has been contracting all week-end but pain became more regular last night. Pt denies LOF and bleeding, baby is active.

## 2015-01-11 NOTE — Anesthesia Preprocedure Evaluation (Signed)
Anesthesia Evaluation  Patient identified by MRN, date of birth, ID band Patient awake    Reviewed: Allergy & Precautions, Patient's Chart, lab work & pertinent test results  Airway Mallampati: II  TM Distance: >3 FB Neck ROM: Full    Dental no notable dental hx. (+) Teeth Intact   Pulmonary former smoker,  breath sounds clear to auscultation  Pulmonary exam normal       Cardiovascular negative cardio ROS Normal cardiovascular examRhythm:Regular Rate:Normal     Neuro/Psych negative neurological ROS  negative psych ROS   GI/Hepatic Neg liver ROS, GERD-  Medicated and Controlled,  Endo/Other  negative endocrine ROS  Renal/GU negative Renal ROS  negative genitourinary   Musculoskeletal negative musculoskeletal ROS (+)   Abdominal   Peds  Hematology  (+) anemia , Thrombocytopenia- probably gestational   Anesthesia Other Findings   Reproductive/Obstetrics                             Anesthesia Physical Anesthesia Plan  ASA: II  Anesthesia Plan: Epidural   Post-op Pain Management:    Induction:   Airway Management Planned: Natural Airway  Additional Equipment:   Intra-op Plan:   Post-operative Plan:   Informed Consent: I have reviewed the patients History and Physical, chart, labs and discussed the procedure including the risks, benefits and alternatives for the proposed anesthesia with the patient or authorized representative who has indicated his/her understanding and acceptance.     Plan Discussed with: Anesthesiologist  Anesthesia Plan Comments:         Anesthesia Quick Evaluation
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
## 2015-01-11 NOTE — Anesthesia Procedure Notes (Signed)
Epidural Patient location during procedure: OB Start time: 01/11/2015 8:35 AM  Staffing Anesthesiologist: Mal Amabile Performed by: anesthesiologist   Preanesthetic Checklist Completed: patient identified, site marked, surgical consent, pre-op evaluation, timeout performed, IV checked, risks and benefits discussed and monitors and equipment checked  Epidural Patient position: sitting Prep: site prepped and draped and DuraPrep Patient monitoring: continuous pulse ox and blood pressure Approach: midline Location: L3-L4 Injection technique: LOR air  Needle:  Needle type: Tuohy  Needle gauge: 17 G Needle length: 9 cm and 9 Needle insertion depth: 4 cm Catheter type: closed end flexible Catheter size: 19 Gauge Catheter at skin depth: 8 cm Test dose: negative and Other  Assessment Events: blood not aspirated, injection not painful, no injection resistance, negative IV test and no paresthesia  Additional Notes Patient identified. Risks and benefits discussed including failed block, incomplete  Pain control, post dural puncture headache, nerve damage, paralysis, blood pressure Changes, nausea, vomiting, reactions to medications-both toxic and allergic and post Partum back pain. All questions were answered. Patient expressed understanding and wished to proceed. Sterile technique was used throughout procedure. Epidural site was Dressed with sterile barrier dressing. No paresthesias, signs of intravascular injection Or signs of intrathecal spread were encountered.  Patient was more comfortable after the epidural was dosed. Please see RN's note for documentation of vital signs and FHR which are stable.

## 2015-01-11 NOTE — MAU Note (Signed)
Pt to be admitted to L&D with routine orders and GBS protocol. 

## 2015-01-12 MED ORDER — RHO D IMMUNE GLOBULIN 1500 UNIT/2ML IJ SOSY
300.0000 ug | PREFILLED_SYRINGE | Freq: Once | INTRAMUSCULAR | Status: AC
  Administered 2015-01-12: 300 ug via INTRAMUSCULAR
  Filled 2015-01-12: qty 2

## 2015-01-12 MED ORDER — OXYCODONE-ACETAMINOPHEN 5-325 MG PO TABS: 1.0000 | ORAL_TABLET | Freq: Four times a day (QID) | ORAL | Status: DC | PRN

## 2015-01-12 NOTE — Progress Notes (Signed)
UR chart review completed.  

## 2015-01-12 NOTE — Progress Notes (Signed)
Mom left without her discharge paperwork being completed.  Baby bands were checked

## 2015-01-12 NOTE — Lactation Note (Signed)
This note was copied from the chart of Heather Fox. Lactation Consultation Note  Patient Name: Heather Akaylah Ramsaran Today's Date: 01/12/2015 Reason for consult: Initial assessment  1st LC visit for this dyad , baby 63 hours old , 1% weight loss ( 6.9.5 oz , breast feeding consistently 10 -30 mins, Latch scores - 9-8-9  Average 30 mins. Supplemented x 1 5 ml , and per mom the baby didn't like it. 1 void , 4 stools, BIli check not as of yet. Bbay awake and resting on moms chest. Mom independently latched with depth at 1st , swallows noted and consistent pattern.  Baby eased awake from mom and became non - nutritive and sleepy.  Sore nipple and engorgement prevention and tx reviewed , referring to the Baby and me booklet. LC instructed mom on the use hand pump.  Mother informed of post-discharge support and given phone number to the lactation department, including services for phone call assistance;  out-patient appointments; and breastfeeding support group. List of other breastfeeding resources in the community given in the handout. Encouraged  mother to call for problems or concerns related to breastfeeding.   Maternal Data    Feeding Feeding Type: Breast Fed Length of feed: 15 min (nutritive to start , and fell asleep, swallows noted )  [facility] Score/Interventions                      Lactation Tools Discussed/Used     Consult Status      Kathrin Greathouse 01/12/2015, 11:37 AM

## 2015-01-12 NOTE — Progress Notes (Signed)
Post Partum Day 1  Subjective:  Heather Fox is a 19 y.o. [id number] [redacted]w[redacted]d s/p NSVD.  No acute events overnight.  Pt denies problems with ambulating, voiding or po intake.  She denies nausea or vomiting.  Pain is moderately controlled.  She has had flatus. She has had bowel movement.  Lochia Large.  Plan for birth control is undecided.  Method of Feeding: breast and bottle.  Objective: BP 101/58 mmHg  Pulse 71  Temp(Src) 98.2 F (36.8 C) (Oral)  Resp 18  Ht  (1.626 m)  Wt 157 lb (71.215 kg)  BMI 26.94 kg/m2  SpO2 100%  LMP 04/12/2014 (Exact Date)  Breastfeeding? Unknown  Physical Exam:  General: alert, cooperative and no distress Lochia:normal flow Chest: CTAB Heart: RRR no m/r/g Abdomen: +BS, soft, nontender, fundus firm at/below umbilicus Uterine Fundus: firm DVT Evaluation: No evidence of DVT seen on physical exam. Extremities: no edema   Recent Labs  01/11/15 0630 01/11/15 1704  HGB 11.1* 10.5*  HCT 33.8* 32.2*    Assessment/Plan:  ASSESSMENT: Heather Fox is a 19 y.o. [id number] [redacted]w[redacted]d ppd #1 s/p NSVD doing well.   Plan for discharge tomorrow  Discuss contraception again before discharge    LOS: 1 day   Heather Fox 01/12/2015, 8:07 AM

## 2015-01-12 NOTE — Anesthesia Postprocedure Evaluation (Signed)
  Anesthesia Post-op Note  Patient: Heather Fox  Procedure(s) Performed: * No procedures listed *  Patient Location: Mother/Baby  Anesthesia Type:Epidural  Level of Consciousness: awake, alert  and oriented  Airway and Oxygen Therapy: Patient Spontanous Breathing  Post-op Pain: none  Post-op Assessment: Post-op Vital signs reviewed and Patient's Cardiovascular Status Stable              Post-op Vital Signs: Reviewed and stable  Last Vitals:  Filed Vitals:   01/12/15 0735  BP: 93/55  Pulse: 66  Temp: 36.8 C  Resp: 16    Complications: No apparent anesthesia complications

## 2015-01-12 NOTE — Clinical Social Work Maternal (Signed)
CLINICAL SOCIAL WORK MATERNAL/CHILD NOTE  Patient Details  Name: Heather Fox MRN: [id number] Date of Birth: 10/13/1995  Date:  10-Nov-2014  Clinical Social Worker Initiating Note:  Loleta Books, LCSW Date/ Time Initiated:  01/12/15/1415     Child's Name:  Heather Fox   Legal Guardian:  Heather Fox (mother)   Need for Interpreter:  None   Date of Referral:  Sep 07, 2014     Reason for Referral:  Current Substance Use/Substance Use During Pregnancy    Referral Source:  [facility]   Address:  [address] [place], [place] [address]  Phone number:  [phone]   Household Members:  Minor Children   Natural Supports (not living in the home):  Friends, Immediate Family   Professional Supports: None   Employment: Full-time   Type of Work:   MOB did not clarify.   Education:    N/A  Surveyor, quantity Resources:  Medicaid   Other Resources:  [employer], Sales executive    Cultural/Religious Considerations Which May Impact Care:  None reported  Strengths:  Ability to meet basic needs , Home prepared for child , Pediatrician chosen    Risk Factors/Current Problems:  Substance Use: MOB presents with THC use during the pregnancy. +UDS in February.  MOB reported last use 4-5 months ago. Infant's UDS is negative and MDS is pending.   Cognitive State:  Able to Concentrate , Alert , Linear Thinking    Mood/Affect:  Euthymic , Apprehensive    CSW Assessment:  CSW received request for consult due to MOB presenting with a history of marijuana use during pregnancy.  MOB provided consent for her support person to remain in the room during the assessment.   MOB presented as a limited historian as she was difficult to engage. MOB's responses were short, concise, and she was not forthcoming with information. MOB presented as anxious as her leg was noted to be shaking during the assessment, and appeared suspicious for why CSW was asking personal questions.   MOB denied questions, concerns,  or needs secondary to transition postpartum. She stated that she feels supported, but did not identify any specific support people in her life. MOB confirmed that the home is prepared for the infant, and she is eager to return home for her 18 year old daughter meet the infant. MOB denied questions related to how her daughter will transition to being a big sister, and shared that her daughter will start kindergarten in the upcoming week. MOB did not identify this change as triggering any specific emotions. MOB denied mental health history and denied history of perinatal mood and anxiety disorders. CSW provided education and information on perinatal mood disorders, and MOB agreed to contact her medical provider if she notes onset of symptoms.   Per MOB, THC use occurred early in pregnancy. She did not clarify or identify specific events that lead to [facility] use, but reported last use was approximately 4-5 months ago.  MOB denied questions or concerns related to the hospital drug screen policy and the collection of the infant's urine and meconium.  MOB informed that CPS will be contacted if there is a positive drug screen, MOB denied questions or concerns.   MOB acknowledged ongoing CSW availability during admission, and agreed to contact CSW if needs arise.   CSW Plan/Description:   1)Patient/Family Education: Perinatal mood and anxiety disorders, hospital drug screen policy 2) CSW to monitor infant's drug screens, and will make a CPS report if there is a positive drug screen.  3)No Further Intervention Required/No Barriers to Discharge    Kelby Fam 2015-05-08, 2:40 PM

## 2015-01-12 NOTE — Discharge Instructions (Signed)

## 2015-01-13 LAB — RH IG WORKUP (INCLUDES ABO/RH)
ABO/RH(D): B NEG
FETAL SCREEN: NEGATIVE
Gestational Age(Wks): 39.1
UNIT DIVISION: 0

## 2015-01-14 LAB — TYPE AND SCREEN
ABO/RH(D): B NEG
Antibody Screen: POSITIVE
DAT, IgG: NEGATIVE
Unit division: 0
Unit division: 0

## 2015-01-15 ENCOUNTER — Telehealth: Payer: Self-pay | Admitting: *Deleted

## 2015-01-15 NOTE — Telephone Encounter (Signed)
Received message left on nurse line today at 1128.  Patient states she is still having a lot of pain from delivery and needs a refill on percocet.  Phoned patient.  Explained to patient we do not refill pain medication after she has completed it.  Explained by now her pain should be managed with tylenol and motrin.  Explained to take 600 mg of motrin every 6 hours and can alternate with tylenol if needed.  Patient states she is breastfeeding.  Explained she will feel contractions as she breastfeeds and this is her uterus getting back to its pre-pregnancy size.  Patient states understanding.  Patient also complains of not having a bowel movement.  Encouraged patient to drink plenty of water and to move around.  Patient states she has been laying in bed.  Encouraged patient of importance of moving.  Also told patient she could get a stool softener over the counter to keep stool soft so she doesn't strain with having a bowel movement.  Patient states understanding.

## 2015-01-27 NOTE — Discharge Summary (Signed)
Obstetric Discharge Summary  Reason for Admission: onset of labor Prenatal Procedures: none Intrapartum Procedures: spontaneous vaginal delivery Postpartum Procedures: none Complications-Operative and Postpartum: none  At 4:33 PM a viable female was delivered via NSVD (Presentation: Cephalic). APGAR: 9, 9; weight pending .  Placenta status: delivered, spontaneous, intact. Cord: 3 VC with the following complications: none . Cord pH: not done  Anesthesia: Epidural  Episiotomy: none Lacerations: 1st degree Suture Repair: 3.0 vicryl Est. Blood Loss (mL): 224   Mom to postpartum. Baby to Couplet care / Skin to Skin.  Hospital Course:  Active Problems:   Active labor   Heather Fox is a 19 y.o. [id number] s/p NSVD.  Patient was admitted for contractions. GBS positive was treated. Patient left prior to formal discharge; hence the late filing of this discharge summary. Received PP rhogam for rh negative status.  She has postpartum course that was uncomplicated including no problems with ambulating, PO intake, urination, pain, or bleeding.   Today: No acute events overnight.  Pt denies problems with ambulating, voiding or po intake.  She denies nausea or vomiting.  Pain is well controlled.  She has had flatus. She has not had bowel movement.  Lochia Small.   Physical Exam:  General: alert, cooperative and appears stated age 15: appropriate Uterine Fundus: firm Incision: n/a DVT Evaluation: No evidence of DVT seen on physical exam.  H/H: Lab Results  Component Value Date/Time   HGB 10.5* 01/11/2015 05:04 PM   HCT 32.2* 01/11/2015 05:04 PM    Discharge Diagnoses: Term Pregnancy-delivered  Discharge Information: Date: 01/27/2015 Activity: pelvic rest Diet: routine  Medications: PNV Breast feeding:  Yes Condition: stable Instructions: n/a as left prior to forma d/c and refer to handout Discharge to: home      Medication List    STOP taking these medications         ondansetron 4 MG tablet  Commonly known as:  ZOFRAN      TAKE these medications        oxyCODONE-acetaminophen 5-325 MG per tablet  Commonly known as:  ROXICET  Take 1 tablet by mouth every 6 (six) hours as needed for severe pain.     Prenatal Vitamins 28-0.8 MG Tabs  Take 1 tablet by mouth daily.     ranitidine 150 MG tablet  Commonly known as:  ZANTAC  Take 1 tablet (150 mg total) by mouth 2 (two) times daily.         Silvano Bilis ,MD OB Fellow 01/27/2015,3:13 PM

## 2015-02-24 ENCOUNTER — Ambulatory Visit (INDEPENDENT_AMBULATORY_CARE_PROVIDER_SITE_OTHER): Payer: Medicaid Other | Admitting: Obstetrics & Gynecology

## 2015-02-24 ENCOUNTER — Encounter: Payer: Self-pay | Admitting: Obstetrics & Gynecology

## 2015-02-24 LAB — CBC
HEMATOCRIT: 40.2 % (ref 36.0–46.0)
Hemoglobin: 13.3 g/dL (ref 12.0–15.0)
MCH: 29.6 pg (ref 26.0–34.0)
MCHC: 33.1 g/dL (ref 30.0–36.0)
MCV: 89.3 fL (ref 78.0–100.0)
MPV: 12.3 fL (ref 8.6–12.4)
Platelets: 217 10*3/uL (ref 150–400)
RBC: 4.5 MIL/uL (ref 3.87–5.11)
RDW: 13.1 % (ref 11.5–15.5)
WBC: 7 10*3/uL (ref 4.0–10.5)

## 2015-02-24 LAB — TSH: TSH: 0.528 u[IU]/mL (ref 0.350–4.500)

## 2015-02-24 NOTE — Addendum Note (Signed)
Addended by: Allie Bossier on: 02/24/2015 04:03 PM   Modules accepted: Orders

## 2015-02-24 NOTE — Progress Notes (Signed)
  Subjective:     Heather Fox is a 19 y.o. female P23 ( 54 week old female and )who presents for a postpartum visit. She is 6 weeks postpartum following a spontaneous vaginal delivery. I have fully reviewed the prenatal and intrapartum course. The delivery was at 39 gestational weeks. Outcome: spontaneous vaginal delivery. Anesthesia: epidural. Postpartum course has been normal except for some occasional heavy bleeding.. Baby's course has been normal. Baby is feeding by breast. Bleeding clots. Bowel function is normal. Bladder function is normal. Patient is not sexually active. Contraception method is none. Postpartum depression screening: negative.  The following portions of the patient's history were reviewed and updated as appropriate: allergies, current medications, past family history, past medical history, past social history, past surgical history and problem list.  Review of Systems Pertinent items noted in HPI and remainder of comprehensive ROS otherwise negative.   Objective:    BP 106/73 mmHg  Pulse 81  Temp(Src) 98.5 F (36.9 C)  Wt 128 lb 6.4 oz (58.242 kg)  LMP 02/23/2015  Breastfeeding? Yes  General:  alert   Breasts:  inspection negative, no nipple discharge or bleeding, no masses or nodularity palpable  Lungs: clear to auscultation bilaterally  Heart:  regular rate and rhythm, S1, S2 normal, no murmur, click, rub or gallop  Abdomen: soft, non-tender; bowel sounds normal; no masses,  no organomegaly   Vulva:  normal  Vagina: normal vagina, min-moderate amount of dark red blood  Cervix:  anteverted  Corpus: normal  Adnexa:  normal adnexa  Rectal Exam: Not performed.        Assessment:    Normal postpartum exam. Pap smear not done at today's visit.   Plan:    1. Contraception: none 2. She is considering a  Mirena but will use condoms prn. (She had an Implanon for 3 years but didn't like it due to irregular bleeding) RTC prn

## 2015-05-17 ENCOUNTER — Emergency Department (HOSPITAL_COMMUNITY)
Admission: EM | Admit: 2015-05-17 | Discharge: 2015-05-17 | Disposition: A | Discharge status: Home / Self Care | Payer: Medicaid Other | Attending: Emergency Medicine | Admitting: Emergency Medicine

## 2015-05-17 ENCOUNTER — Encounter (HOSPITAL_COMMUNITY): Payer: Self-pay | Admitting: Emergency Medicine

## 2015-05-17 DIAGNOSIS — R112 Nausea with vomiting, unspecified: Secondary | ICD-10-CM | POA: Insufficient documentation

## 2015-05-17 DIAGNOSIS — R51 Headache: Secondary | ICD-10-CM | POA: Insufficient documentation

## 2015-05-17 DIAGNOSIS — Z87891 Personal history of nicotine dependence: Secondary | ICD-10-CM | POA: Diagnosis not present

## 2015-05-17 DIAGNOSIS — R197 Diarrhea, unspecified: Secondary | ICD-10-CM | POA: Diagnosis not present

## 2015-05-17 DIAGNOSIS — Z3202 Encounter for pregnancy test, result negative: Secondary | ICD-10-CM | POA: Diagnosis not present

## 2015-05-17 LAB — URINE MICROSCOPIC-ADD ON: RBC / HPF: NONE SEEN RBC/hpf (ref 0–5)

## 2015-05-17 LAB — CBC
HCT: 44.7 % (ref 36.0–46.0)
HEMOGLOBIN: 14.6 g/dL (ref 12.0–15.0)
MCH: 30 pg (ref 26.0–34.0)
MCHC: 32.7 g/dL (ref 30.0–36.0)
MCV: 91.8 fL (ref 78.0–100.0)
Platelets: 196 10*3/uL (ref 150–400)
RBC: 4.87 MIL/uL (ref 3.87–5.11)
RDW: 13 % (ref 11.5–15.5)
WBC: 14 10*3/uL — ABNORMAL HIGH (ref 4.0–10.5)

## 2015-05-17 LAB — URINALYSIS, ROUTINE W REFLEX MICROSCOPIC
Glucose, UA: NEGATIVE mg/dL
Hgb urine dipstick: NEGATIVE
KETONES UR: 15 mg/dL — AB
NITRITE: NEGATIVE
PROTEIN: NEGATIVE mg/dL
SPECIFIC GRAVITY, URINE: 1.038 — AB (ref 1.005–1.030)
pH: 5.5 (ref 5.0–8.0)

## 2015-05-17 LAB — COMPREHENSIVE METABOLIC PANEL
ALBUMIN: 4.5 g/dL (ref 3.5–5.0)
ALK PHOS: 65 U/L (ref 38–126)
ALT: 12 U/L — ABNORMAL LOW (ref 14–54)
ANION GAP: 9 (ref 5–15)
AST: 18 U/L (ref 15–41)
BILIRUBIN TOTAL: 0.8 mg/dL (ref 0.3–1.2)
BUN: 14 mg/dL (ref 6–20)
CALCIUM: 9.3 mg/dL (ref 8.9–10.3)
CO2: 27 mmol/L (ref 22–32)
Chloride: 104 mmol/L (ref 101–111)
Creatinine, Ser: 0.73 mg/dL (ref 0.44–1.00)
GFR calc Af Amer: >60 mL/min (ref >60)
GFR calc non Af Amer: >60 mL/min (ref >60)
GLUCOSE: 102 mg/dL — AB (ref 65–99)
Potassium: 4 mmol/L (ref 3.5–5.1)
Sodium: 140 mmol/L (ref 135–145)
TOTAL PROTEIN: 7.2 g/dL (ref 6.5–8.1)

## 2015-05-17 LAB — LIPASE, BLOOD: Lipase: 66 U/L — ABNORMAL HIGH (ref 11–51)

## 2015-05-17 LAB — POC URINE PREG, ED: PREG TEST UR: NEGATIVE

## 2015-05-17 MED ORDER — LOPERAMIDE HCL 2 MG PO CAPS
4.0000 mg | ORAL_CAPSULE | Freq: Once | ORAL | Status: AC
  Administered 2015-05-17: 4 mg via ORAL
  Filled 2015-05-17: qty 2

## 2015-05-17 MED ORDER — LOPERAMIDE HCL 2 MG PO CAPS: 2.0000 mg | ORAL_CAPSULE | ORAL | Status: DC | PRN

## 2015-05-17 MED ORDER — ONDANSETRON 4 MG PO TBDP: 4.0000 mg | ORAL_TABLET | Freq: Three times a day (TID) | ORAL | Status: DC | PRN

## 2015-05-17 MED ORDER — ONDANSETRON 4 MG PO TBDP
ORAL_TABLET | ORAL | Status: AC
  Filled 2015-05-17: qty 1

## 2015-05-17 MED ORDER — ONDANSETRON 4 MG PO TBDP
4.0000 mg | ORAL_TABLET | Freq: Once | ORAL | Status: AC | PRN
  Administered 2015-05-17: 4 mg via ORAL

## 2015-05-17 NOTE — ED Provider Notes (Signed)
CSN: [id number]     Arrival date & time 05/17/15  0220 History   First MD Initiated Contact with Patient 05/17/15 [phone]     Chief Complaint  Patient presents with  . Emesis  . Diarrhea     (Consider location/radiation/quality/duration/timing/severity/associated sxs/prior Treatment) HPI  This is a 19 year old female who presents with vomiting and diarrhea. Patient reports onset of symptoms at 7 PM last night. She's had multiple episodes of nonbilious, nonbloody emesis. She denies abdominal pain. Also reports several episodes of diarrhea. Her son has had diarrhea recently. Denies fever. Does report headache and myalgias. Patient received Zofran prior to my evaluation and reports that she feels much better. She has already tolerated fluids.  History reviewed. No pertinent past medical history. Past Surgical History  Procedure Laterality Date  . No past surgeries     Family History  Problem Relation Age of Onset  . Cancer Neg Hx   . Diabetes Neg Hx   . Heart disease Neg Hx   . Hypertension Neg Hx   . Stroke Neg Hx   . Hearing loss Neg Hx    Social History  Substance Use Topics  . Smoking status: Former Smoker -- 1.00 packs/day    Types: Cigarettes  . Smokeless tobacco: Never Used     Comment: quit 2015  . Alcohol Use: No   OB History    Gravida Para Term Preterm AB TAB SAB Ectopic Multiple Living   0 2     Review of Systems  Constitutional: Negative for fever.  Respiratory: Negative for cough, chest tightness and shortness of breath.   Cardiovascular: Negative for chest pain.  Gastrointestinal: Positive for nausea, vomiting and diarrhea. Negative for abdominal pain.  Genitourinary: Negative for dysuria.  Neurological: Positive for headaches. Negative for weakness.  All other systems reviewed and are negative.     Allergies  Review of patient's allergies indicates no known allergies.  Home Medications   Prior to Admission medications   Medication Sig  Start Date End Date Taking? Authorizing Provider  loperamide (IMODIUM) 2 MG capsule Take 1 capsule (2 mg total) by mouth as needed for diarrhea or loose stools. 05/17/15   Shon Baton, MD  ondansetron (ZOFRAN-ODT) 4 MG disintegrating tablet Take 1 tablet (4 mg total) by mouth every 8 (eight) hours as needed for nausea. 05/17/15   Shon Baton, MD  oxyCODONE-acetaminophen (ROXICET) 5-325 MG per tablet Take 1 tablet by mouth every 6 (six) hours as needed for severe pain. Patient not taking: Reported on 02/24/2015 01/12/15   Marquette Saa, MD  Prenatal Vit-Fe Fumarate-FA (PRENATAL VITAMINS) 28-0.8 MG TABS Take 1 tablet by mouth daily. 07/08/14   Misty Stanley A Leftwich-Kirby, CNM  ranitidine (ZANTAC) 150 MG tablet Take 1 tablet (150 mg total) by mouth 2 (two) times daily. Patient not taking: Reported on 01/11/2015 12/21/14   Kathrynn Running, MD   BP 96/62 mmHg  Pulse 88  Temp(Src) 98.7 F (37.1 C) (Oral)  Resp 16  SpO2 97%  LMP 04/11/2015 Physical Exam  Constitutional: She is oriented to person, place, and time. She appears well-developed and well-nourished. No distress.  HENT:  Head: Normocephalic and atraumatic.  Mucous membranes moist.  Eyes: Pupils are equal, round, and reactive to light.  Cardiovascular: Normal rate, regular rhythm and normal heart sounds.   No murmur heard. Pulmonary/Chest: Effort normal and breath sounds normal. No respiratory distress. She has no wheezes.  Abdominal: Soft. Bowel  sounds are normal. There is no tenderness. There is no rebound and no guarding.  Neurological: She is alert and oriented to person, place, and time.  Skin: Skin is warm and dry.  Psychiatric: She has a normal mood and affect.  Nursing note and vitals reviewed.   ED Course  Procedures (including critical care time) Labs Review Labs Reviewed  LIPASE, BLOOD - Abnormal; Notable for the following:    Lipase 66 (*)    All other components within normal limits  COMPREHENSIVE  METABOLIC PANEL - Abnormal; Notable for the following:    Glucose, Bld 102 (*)    ALT 12 (*)    All other components within normal limits  CBC - Abnormal; Notable for the following:    WBC 14.0 (*)    All other components within normal limits  URINALYSIS, ROUTINE W REFLEX MICROSCOPIC (NOT AT [facility]) - Abnormal; Notable for the following:    Color, Urine AMBER (*)    APPearance CLOUDY (*)    Specific Gravity, Urine 1.038 (*)    Bilirubin Urine SMALL (*)    Ketones, ur 15 (*)    Leukocytes, UA SMALL (*)    All other components within normal limits  URINE MICROSCOPIC-ADD ON - Abnormal; Notable for the following:    Squamous Epithelial / LPF 6-30 (*)    Bacteria, UA MANY (*)    All other components within normal limits  POC URINE PREG, ED    Imaging Review No results found. I have personally reviewed and evaluated these images and lab results as part of my medical decision-making.   EKG Interpretation None      MDM   Final diagnoses:  Nausea, vomiting and diarrhea   Patient presents with nausea, vomiting, and diarrhea for the last 12 hours. Nontoxic on exam. Has had known sick contacts. Afebrile. Reports that she feels much better after Zofran.  Abdomen is nontender.  Lab work sent from triage notable for a white count of 14 and 15 ketones in the urine. She does have 6-30 white cells and many bacteria in her urine but it appears to be a dirty catch. She denies any urinary symptoms. Suspect viral etiology for the patient's symptoms. Leukocytosis is nonspecific at this point especially given benign abdominal exam. Patient given Zofran and Imodium for supportive management home. She was able to tolerate fluids prior to discharge.  After history, exam, and medical workup I feel the patient has been appropriately medically screened and is safe for discharge home. Pertinent diagnoses were discussed with the patient. Patient was given return precautions.     Shon Batonourtney F Horton,  MD 05/17/15 [phone]

## 2015-05-17 NOTE — Discharge Instructions (Signed)

## 2015-05-17 NOTE — ED Notes (Signed)
C/o nausea, vomiting, and diarrhea since 7pm.  Denies abd pain.  Reports headache since midnight.

## 2015-06-02 ENCOUNTER — Ambulatory Visit: Payer: Medicaid Other | Admitting: Obstetrics & Gynecology

## 2015-08-05 ENCOUNTER — Encounter (HOSPITAL_COMMUNITY): Payer: Self-pay

## 2015-08-05 ENCOUNTER — Emergency Department (HOSPITAL_COMMUNITY)
Admission: EM | Admit: 2015-08-05 | Discharge: 2015-08-06 | Disposition: A | Discharge status: Home / Self Care | Payer: Medicaid Other | Attending: Emergency Medicine | Admitting: Emergency Medicine

## 2015-08-05 DIAGNOSIS — O209 Hemorrhage in early pregnancy, unspecified: Secondary | ICD-10-CM | POA: Insufficient documentation

## 2015-08-05 DIAGNOSIS — R109 Unspecified abdominal pain: Secondary | ICD-10-CM | POA: Insufficient documentation

## 2015-08-05 DIAGNOSIS — Z3A01 Less than 8 weeks gestation of pregnancy: Secondary | ICD-10-CM | POA: Diagnosis not present

## 2015-08-05 DIAGNOSIS — O9989 Other specified diseases and conditions complicating pregnancy, childbirth and the puerperium: Secondary | ICD-10-CM | POA: Diagnosis not present

## 2015-08-05 LAB — COMPREHENSIVE METABOLIC PANEL
ALT: 16 U/L (ref 14–54)
AST: 20 U/L (ref 15–41)
Albumin: 4.3 g/dL (ref 3.5–5.0)
Alkaline Phosphatase: 61 U/L (ref 38–126)
Anion gap: 9 (ref 5–15)
BILIRUBIN TOTAL: 0.5 mg/dL (ref 0.3–1.2)
BUN: 15 mg/dL (ref 6–20)
CHLORIDE: 106 mmol/L (ref 101–111)
CO2: 25 mmol/L (ref 22–32)
CREATININE: 0.76 mg/dL (ref 0.44–1.00)
Calcium: 9.4 mg/dL (ref 8.9–10.3)
Glucose, Bld: 116 mg/dL — ABNORMAL HIGH (ref 65–99)
Potassium: 3.8 mmol/L (ref 3.5–5.1)
Sodium: 140 mmol/L (ref 135–145)
Total Protein: 7.3 g/dL (ref 6.5–8.1)

## 2015-08-05 LAB — I-STAT BETA HCG BLOOD, ED (MC, WL, AP ONLY): I-stat hCG, quantitative: <5 m[IU]/mL (ref <5)

## 2015-08-05 LAB — CBC WITH DIFFERENTIAL/PLATELET
Basophils Absolute: 0 10*3/uL (ref 0.0–0.1)
Basophils Relative: 0 %
EOS PCT: 0 %
Eosinophils Absolute: 0 10*3/uL (ref 0.0–0.7)
HEMATOCRIT: 39.7 % (ref 36.0–46.0)
Hemoglobin: 12.8 g/dL (ref 12.0–15.0)
LYMPHS ABS: 2.5 10*3/uL (ref 0.7–4.0)
LYMPHS PCT: 22 %
MCH: 29.6 pg (ref 26.0–34.0)
MCHC: 32.2 g/dL (ref 30.0–36.0)
MCV: 91.9 fL (ref 78.0–100.0)
MONO ABS: 0.4 10*3/uL (ref 0.1–1.0)
Monocytes Relative: 3 %
NEUTROS ABS: 8.5 10*3/uL — AB (ref 1.7–7.7)
Neutrophils Relative %: 75 %
PLATELETS: 214 10*3/uL (ref 150–400)
RBC: 4.32 MIL/uL (ref 3.87–5.11)
RDW: 12.7 % (ref 11.5–15.5)
WBC: 11.4 10*3/uL — ABNORMAL HIGH (ref 4.0–10.5)

## 2015-08-05 NOTE — ED Notes (Signed)
Pt found out a couple of days ago that she is pregnant, approx 6 weeks. Today she reports vaginal bleeding and abdominal pain "and something weird came out." Denies dizziness.

## 2015-09-29 ENCOUNTER — Emergency Department (HOSPITAL_COMMUNITY)
Admission: EM | Admit: 2015-09-29 | Discharge: 2015-09-29 | Disposition: A | Discharge status: Home / Self Care | Payer: Medicaid Other | Attending: Emergency Medicine | Admitting: Emergency Medicine

## 2015-09-29 ENCOUNTER — Emergency Department (HOSPITAL_COMMUNITY): Payer: Medicaid Other

## 2015-09-29 ENCOUNTER — Encounter (HOSPITAL_COMMUNITY): Payer: Self-pay | Admitting: Emergency Medicine

## 2015-09-29 DIAGNOSIS — Z79899 Other long term (current) drug therapy: Secondary | ICD-10-CM | POA: Diagnosis not present

## 2015-09-29 DIAGNOSIS — W208XXA Other cause of strike by thrown, projected or falling object, initial encounter: Secondary | ICD-10-CM | POA: Diagnosis not present

## 2015-09-29 DIAGNOSIS — Y9389 Activity, other specified: Secondary | ICD-10-CM | POA: Insufficient documentation

## 2015-09-29 DIAGNOSIS — Z87891 Personal history of nicotine dependence: Secondary | ICD-10-CM | POA: Diagnosis not present

## 2015-09-29 DIAGNOSIS — Y9289 Other specified places as the place of occurrence of the external cause: Secondary | ICD-10-CM | POA: Diagnosis not present

## 2015-09-29 DIAGNOSIS — S99921A Unspecified injury of right foot, initial encounter: Secondary | ICD-10-CM | POA: Diagnosis present

## 2015-09-29 DIAGNOSIS — Y998 Other external cause status: Secondary | ICD-10-CM | POA: Insufficient documentation

## 2015-09-29 DIAGNOSIS — S90121A Contusion of right lesser toe(s) without damage to nail, initial encounter: Secondary | ICD-10-CM | POA: Insufficient documentation

## 2015-09-29 NOTE — ED Provider Notes (Signed)
History  By signing my name below, I, Earmon Phoenix, attest that this documentation has been prepared under the direction and in the presence of Melburn Hake, [place]. Electronically Signed: Earmon Phoenix, ED Scribe. 09/29/2015. 12:49 PM.  Chief Complaint  Patient presents with  . Toe Injury   The history is provided by the patient and medical records. No language interpreter was used.    HPI Comments:  Heather Fox is a 20 y.o. female who presents to the Emergency Department complaining of a left fourth toe injury secondary to dropping a clothes iron on it while bare foot about six hours ago. She reports a small laceration and some bruising to the right fourth toe. She states the fifth digit of the right foot is also sore. She has taken Aleve after the incident with some relief of the pain. Bearing weight or moving the toes increases the pain. She denies alleviating factors. She denies right ankle pain or injury, numbness, tingling or weakness of the right toes or ankle. Tetanus UTD.  History reviewed. No pertinent past medical history. Past Surgical History  Procedure Laterality Date  . No past surgeries     Family History  Problem Relation Age of Onset  . Cancer Neg Hx   . Diabetes Neg Hx   . Heart disease Neg Hx   . Hypertension Neg Hx   . Stroke Neg Hx   . Hearing loss Neg Hx    Social History  Substance Use Topics  . Smoking status: Former Smoker -- 1.00 packs/day    Types: Cigarettes  . Smokeless tobacco: Never Used     Comment: quit 2015  . Alcohol Use: No   OB History    Gravida Para Term Preterm AB TAB SAB Ectopic Multiple Living   0 2     Review of Systems  Musculoskeletal: Positive for arthralgias.  Skin: Positive for color change and wound.  Neurological: Negative for weakness and numbness.    Allergies  Review of patient's allergies indicates no known allergies.  Home Medications   Prior to Admission medications   Medication  Sig Start Date End Date Taking? Authorizing Provider  loperamide (IMODIUM) 2 MG capsule Take 1 capsule (2 mg total) by mouth as needed for diarrhea or loose stools. 05/17/15   Shon Baton, MD  ondansetron (ZOFRAN-ODT) 4 MG disintegrating tablet Take 1 tablet (4 mg total) by mouth every 8 (eight) hours as needed for nausea. 05/17/15   Shon Baton, MD  oxyCODONE-acetaminophen (ROXICET) 5-325 MG per tablet Take 1 tablet by mouth every 6 (six) hours as needed for severe pain. Patient not taking: Reported on 02/24/2015 01/12/15   Marquette Saa, MD  Prenatal Vit-Fe Fumarate-FA (PRENATAL VITAMINS) 28-0.8 MG TABS Take 1 tablet by mouth daily. 07/08/14   Misty Stanley A Leftwich-Kirby, CNM  ranitidine (ZANTAC) 150 MG tablet Take 1 tablet (150 mg total) by mouth 2 (two) times daily. Patient not taking: Reported on 01/11/2015 12/21/14   Kathrynn Running, MD   Triage Vitals: BP 109/71 mmHg  Pulse 83  Temp(Src) 98.6 F (37 C) (Oral)  Resp 18  Ht  (1.626 m)  Wt 130 lb (58.968 kg)  BMI 22.30 kg/m2  SpO2 99%  LMP 06/30/2015  Breastfeeding? Unknown Physical Exam  Constitutional: She is oriented to person, place, and time. She appears well-developed and well-nourished.  HENT:  Head: Normocephalic and atraumatic.  Eyes: EOM are normal.  Neck: Normal range  of motion.  Cardiovascular: Normal rate.   Pulmonary/Chest: Effort normal.  Musculoskeletal: She exhibits tenderness.  Mild swelling and tenderness to palpation over right 4th and 5th toe. Decreased ROM due to reported pain. Small abrasion noted to lateral aspect of fourth right toe. No active bleeding. 2+ DP pulse. Sensation grossly intact. Cap refill less than 2 seconds. Full ROM of right forefoot, ankle and knee.  Neurological: She is alert and oriented to person, place, and time.  Skin: Skin is warm and dry.  Psychiatric: She has a normal mood and affect. Her behavior is normal.  Nursing note and vitals reviewed.   ED Course   Procedures (including critical care time) DIAGNOSTIC STUDIES: Oxygen Saturation is 99% on RA, normal by my interpretation.   COORDINATION OF CARE: 12:03 PM- Will X-Ray right fourth toe. Pt verbalizes understanding and agrees to plan.  Medications - No data to display  Labs Review Labs Reviewed - No data to display  Imaging Review Dg Foot Complete Right  09/29/2015  CLINICAL DATA:  Pain and swelling of the right fourth and fifth toe after injury today. Initial encounter. EXAM: RIGHT FOOT COMPLETE - 3+ VIEW COMPARISON:  None. FINDINGS: There is no evidence of fracture or dislocation. There is no evidence of arthropathy or other focal bone abnormality. Soft tissues are unremarkable. IMPRESSION: Negative. Electronically Signed   By: Marnee SpringJonathon  Watts M.D.   On: 09/29/2015 12:43   I have personally reviewed and evaluated these images and lab results as part of my medical decision-making.   EKG Interpretation None      MDM   Final diagnoses:  Toe contusion, right, initial encounter    Patient presents with pain and swelling to right fourth and fifth toe after dropping an iron on her foot earlier this morning. Tetanus up-to-date. VSS. Exam revealed tenderness and mild swelling over right fourth and fifth toe, small abrasion present, no active bleeding. Right lower extremity otherwise neurovascular intact. Right foot x-ray negative. Suspect patient's symptoms are likely due to contusion associated with recent injury. Plan to discharge patient home with symptomatic treatment. Discussed results and plan with pt. Patient given information regarding outpatient follow-up. Discussed return precautions with patient.  I personally performed the services described in this documentation, which was scribed in my presence. The recorded information has been reviewed and is accurate.     Satira Sarkicole Elizabeth CrockerNadeau, [place]-C 09/29/15 1324  Zadie Rhineonald Wickline, MD 09/29/15 1505

## 2015-09-29 NOTE — ED Notes (Signed)
Pt reports that she dropped an iron on her 4h toe on the right foot this morning. Pt alert NAD at this time.

## 2015-09-29 NOTE — ED Notes (Signed)
Declined W/C at D/C and was escorted to lobby by RN. 

## 2015-09-29 NOTE — Discharge Instructions (Signed)
You may take 600 mg ibuprofen 4 times daily as needed to help with pain and swelling. I recommend continuing to rest, elevate and ice her right foot for 15-20 minutes 3-4 times daily to help with pain and swelling. Please follow up with a primary care provider from the Resource Guide provided below in 5-7 days as needed. Please return to the Emergency Department if symptoms worsen or new onset of fever, redness, swelling, warmth, drainage, numbness, tingling, weakness.

## 2015-11-24 ENCOUNTER — Emergency Department (HOSPITAL_COMMUNITY)
Admission: EM | Admit: 2015-11-24 | Discharge: 2015-11-25 | Disposition: A | Discharge status: Home / Self Care | Payer: Medicaid Other | Attending: Emergency Medicine | Admitting: Emergency Medicine

## 2015-11-24 ENCOUNTER — Encounter (HOSPITAL_COMMUNITY): Payer: Self-pay | Admitting: *Deleted

## 2015-11-24 DIAGNOSIS — Z87891 Personal history of nicotine dependence: Secondary | ICD-10-CM | POA: Insufficient documentation

## 2015-11-24 DIAGNOSIS — R0602 Shortness of breath: Secondary | ICD-10-CM | POA: Diagnosis not present

## 2015-11-24 DIAGNOSIS — E860 Dehydration: Secondary | ICD-10-CM | POA: Diagnosis not present

## 2015-11-24 DIAGNOSIS — R51 Headache: Secondary | ICD-10-CM | POA: Diagnosis present

## 2015-11-24 LAB — URINALYSIS, ROUTINE W REFLEX MICROSCOPIC
Bilirubin Urine: NEGATIVE
GLUCOSE, UA: NEGATIVE mg/dL
Hgb urine dipstick: NEGATIVE
KETONES UR: 15 mg/dL — AB
NITRITE: NEGATIVE
PROTEIN: NEGATIVE mg/dL
Specific Gravity, Urine: 1.029 (ref 1.005–1.030)
pH: 5.5 (ref 5.0–8.0)

## 2015-11-24 LAB — COMPREHENSIVE METABOLIC PANEL
ALT: 14 U/L (ref 14–54)
AST: 22 U/L (ref 15–41)
Albumin: 4.4 g/dL (ref 3.5–5.0)
Alkaline Phosphatase: 63 U/L (ref 38–126)
Anion gap: 7 (ref 5–15)
BILIRUBIN TOTAL: 0.7 mg/dL (ref 0.3–1.2)
BUN: 9 mg/dL (ref 6–20)
CO2: 26 mmol/L (ref 22–32)
CREATININE: 0.75 mg/dL (ref 0.44–1.00)
Calcium: 9.2 mg/dL (ref 8.9–10.3)
Chloride: 103 mmol/L (ref 101–111)
GFR calc Af Amer: >60 mL/min (ref >60)
Glucose, Bld: 91 mg/dL (ref 65–99)
Potassium: 3.7 mmol/L (ref 3.5–5.1)
Sodium: 136 mmol/L (ref 135–145)
TOTAL PROTEIN: 7.6 g/dL (ref 6.5–8.1)

## 2015-11-24 LAB — CBC
HCT: 43.5 % (ref 36.0–46.0)
Hemoglobin: 14.2 g/dL (ref 12.0–15.0)
MCH: 30.5 pg (ref 26.0–34.0)
MCHC: 32.6 g/dL (ref 30.0–36.0)
MCV: 93.5 fL (ref 78.0–100.0)
Platelets: 196 10*3/uL (ref 150–400)
RBC: 4.65 MIL/uL (ref 3.87–5.11)
RDW: 12.7 % (ref 11.5–15.5)
WBC: 11.9 10*3/uL — AB (ref 4.0–10.5)

## 2015-11-24 LAB — LIPASE, BLOOD: Lipase: 22 U/L (ref 11–51)

## 2015-11-24 LAB — PREGNANCY, URINE: Preg Test, Ur: NEGATIVE

## 2015-11-24 LAB — URINE MICROSCOPIC-ADD ON: RBC / HPF: NONE SEEN RBC/hpf (ref 0–5)

## 2015-11-24 MED ORDER — ONDANSETRON HCL 4 MG/2ML IJ SOLN
4.0000 mg | Freq: Once | INTRAMUSCULAR | Status: AC
  Administered 2015-11-24: 4 mg via INTRAVENOUS
  Filled 2015-11-24: qty 2

## 2015-11-24 MED ORDER — KETOROLAC TROMETHAMINE 30 MG/ML IJ SOLN
30.0000 mg | Freq: Once | INTRAMUSCULAR | Status: AC
  Administered 2015-11-24: 30 mg via INTRAVENOUS
  Filled 2015-11-24: qty 1

## 2015-11-24 MED ORDER — SODIUM CHLORIDE 0.9 % IV BOLUS (SEPSIS)
1000.0000 mL | Freq: Once | INTRAVENOUS | Status: AC
  Administered 2015-11-24: 1000 mL via INTRAVENOUS

## 2015-11-24 NOTE — ED Notes (Signed)
Pt requesting pain medicine, informed that pt must be seen my EDP first.

## 2015-11-24 NOTE — ED Provider Notes (Signed)
CSN: [id number]     Arrival date & time 11/24/15  2052 History   First MD Initiated Contact with Patient 11/24/15 2216     Chief Complaint  Patient presents with  . Headache  . Nausea  . Shortness of Breath     (Consider location/radiation/quality/duration/timing/severity/associated sxs/prior Treatment) HPI Comments: Patient without significant medical history presents with concern for dehydration. She reports she became overheated today, which caused nausea and vomiting and later caused a headache and SOB. No chest or abdominal pain. No fever, diarrhea. She denies chance of pregnancy. No urinary symptoms. She reports that headache, vomiting and SOB are what she has experienced in the past with dehydration.   Patient is a 20 y.o. female presenting with headaches and shortness of breath. The history is provided by the patient. No language interpreter was used.  Headache Pain location:  Generalized Quality:  Dull Radiates to:  Does not radiate Associated symptoms: nausea, vomiting and weakness   Associated symptoms: no abdominal pain, no cough, no fever and no neck stiffness   Shortness of Breath Associated symptoms: headaches and vomiting   Associated symptoms: no abdominal pain, no chest pain, no cough and no fever     History reviewed. No pertinent past medical history. Past Surgical History  Procedure Laterality Date  . No past surgeries     Family History  Problem Relation Age of Onset  . Cancer Neg Hx   . Diabetes Neg Hx   . Heart disease Neg Hx   . Hypertension Neg Hx   . Stroke Neg Hx   . Hearing loss Neg Hx    Social History  Substance Use Topics  . Smoking status: Former Smoker -- 1.00 packs/day    Types: Cigarettes  . Smokeless tobacco: Never Used     Comment: quit 2015  . Alcohol Use: No   OB History    Gravida Para Term Preterm AB TAB SAB Ectopic Multiple Living   0 2     Review of Systems  Constitutional: Negative for fever and chills.   Respiratory: Positive for shortness of breath. Negative for cough.   Cardiovascular: Negative.  Negative for chest pain.  Gastrointestinal: Positive for nausea and vomiting. Negative for abdominal pain.  Genitourinary: Negative for dysuria.  Musculoskeletal: Negative.  Negative for neck stiffness.  Skin: Negative.   Neurological: Positive for weakness and headaches.      Allergies  Review of patient's allergies indicates no known allergies.  Home Medications   Prior to Admission medications   Medication Sig Start Date End Date Taking? Authorizing Provider  loperamide (IMODIUM) 2 MG capsule Take 1 capsule (2 mg total) by mouth as needed for diarrhea or loose stools. 05/17/15   Shon Baton, MD  ondansetron (ZOFRAN-ODT) 4 MG disintegrating tablet Take 1 tablet (4 mg total) by mouth every 8 (eight) hours as needed for nausea. 05/17/15   Shon Baton, MD  oxyCODONE-acetaminophen (ROXICET) 5-325 MG per tablet Take 1 tablet by mouth every 6 (six) hours as needed for severe pain. Patient not taking: Reported on 02/24/2015 01/12/15   Marquette Saa, MD  Prenatal Vit-Fe Fumarate-FA (PRENATAL VITAMINS) 28-0.8 MG TABS Take 1 tablet by mouth daily. 07/08/14   Misty Stanley A Leftwich-Kirby, CNM  ranitidine (ZANTAC) 150 MG tablet Take 1 tablet (150 mg total) by mouth 2 (two) times daily. Patient not taking: Reported on 01/11/2015 12/21/14   Kathrynn Running, MD   BP 102/60 mmHg  Pulse 104  Temp(Src) 99.1 F (37.3 C) (Oral)  Resp 19  SpO2 100%  LMP 11/16/2015 Physical Exam  Constitutional: She is oriented to person, place, and time. She appears well-developed and well-nourished.  HENT:  Head: Normocephalic.  Lips are dry. Oral mucosa dry.  Neck: Normal range of motion. Neck supple.  Cardiovascular: Normal rate and regular rhythm.   Pulmonary/Chest: Effort normal and breath sounds normal. She has no wheezes. She has no rales.  Abdominal: Soft. Bowel sounds are normal. There is no  tenderness. There is no rebound and no guarding.  Musculoskeletal: Normal range of motion.  Neurological: She is alert and oriented to person, place, and time.  Skin: Skin is warm and dry. No rash noted.  Psychiatric: She has a normal mood and affect.    ED Course  Procedures (including critical care time) Labs Review Labs Reviewed  CBC - Abnormal; Notable for the following:    WBC 11.9 (*)    All other components within normal limits  LIPASE, BLOOD  COMPREHENSIVE METABOLIC PANEL  URINALYSIS, ROUTINE W REFLEX MICROSCOPIC (NOT AT [facility])  PREGNANCY, URINE   Results for orders placed or performed during the hospital encounter of 11/24/15  Lipase, blood  Result Value Ref Range   Lipase 22 11 - 51 U/L  Comprehensive metabolic panel  Result Value Ref Range   Sodium 136 135 - 145 mmol/L   Potassium 3.7 3.5 - 5.1 mmol/L   Chloride 103 101 - 111 mmol/L   CO2 26 22 - 32 mmol/L   Glucose, Bld 91 65 - 99 mg/dL   BUN 9 6 - 20 mg/dL   Creatinine, Ser 8.290.75 0.44 - 1.00 mg/dL   Calcium 9.2 8.9 - 56.210.3 mg/dL   Total Protein 7.6 6.5 - 8.1 g/dL   Albumin 4.4 3.5 - 5.0 g/dL   AST 22 15 - 41 U/L   ALT 14 14 - 54 U/L   Alkaline Phosphatase 63 38 - 126 U/L   Total Bilirubin 0.7 0.3 - 1.2 mg/dL   GFR calc non Af Amer >60 >60 mL/min   GFR calc Af Amer >60 >60 mL/min   Anion gap 7 5 - 15  CBC  Result Value Ref Range   WBC 11.9 (H) 4.0 - 10.5 K/uL   RBC 4.65 3.87 - 5.11 MIL/uL   Hemoglobin 14.2 12.0 - 15.0 g/dL   HCT 13.043.5 86.536.0 - 78.446.0 %   MCV 93.5 78.0 - 100.0 fL   MCH 30.5 26.0 - 34.0 pg   MCHC 32.6 30.0 - 36.0 g/dL   RDW 69.612.7 29.511.5 - 28.415.5 %   Platelets 196 150 - 400 K/uL  Urinalysis, Routine w reflex microscopic  Result Value Ref Range   Color, Urine YELLOW YELLOW   APPearance CLOUDY (A) CLEAR   Specific Gravity, Urine 1.029 1.005 - 1.030   pH 5.5 5.0 - 8.0   Glucose, UA NEGATIVE NEGATIVE mg/dL   Hgb urine dipstick NEGATIVE NEGATIVE   Bilirubin Urine NEGATIVE NEGATIVE   Ketones, ur 15  (A) NEGATIVE mg/dL   Protein, ur NEGATIVE NEGATIVE mg/dL   Nitrite NEGATIVE NEGATIVE   Leukocytes, UA MODERATE (A) NEGATIVE  Pregnancy, urine  Result Value Ref Range   Preg Test, Ur NEGATIVE NEGATIVE  Urine microscopic-add on  Result Value Ref Range   Squamous Epithelial / LPF 6-30 (A) NONE SEEN   WBC, UA 6-30 0 - 5 WBC/hpf   RBC / HPF NONE SEEN 0 - 5 RBC/hpf   Bacteria, UA  FEW (A) NONE SEEN   No results found.  Imaging Review No results found. I have personally reviewed and evaluated these images and lab results as part of my medical decision-making.   EKG Interpretation   Date/Time:  Wednesday November 24 2015 20:59:26 EDT Ventricular Rate:  105 PR Interval:  132 QRS Duration: 76 QT Interval:  310 QTC Calculation: 409 R Axis:   53 Text Interpretation:  Sinus tachycardia Nonspecific T wave abnormality  Abnormal ECG No significant change since last tracing Confirmed by KNOTT  MD, DANIEL ([id number]) on 11/24/2015 9:42:32 PM      MDM   Final diagnoses:  None    1. Dehydration  The patient presents feeling dehydrated after being overheated throughout today. IVF's provided, Zofran, Toradol to control symptoms. On re-evaluation the patient is feeling much better. She is tolerating PO fluids without vomiting. Tachycardia resolved. She is felt stable for discharge home.     Elpidio AnisShari Tymere Depuy, PA-C 11/25/15 [id number]  Lyndal Pulleyaniel Knott, MD 11/25/15 [phone]

## 2015-11-24 NOTE — ED Notes (Addendum)
Pt states that she is dehydrated because "it is hot outside." states she doesn't have AC in her car. States she has a headache, her stomach is weak, she feels short of breath and her lips are chapped.states she didn't eat or drink anthing because she is dehydrated.

## 2015-11-25 MED ORDER — SODIUM CHLORIDE 0.9 % IV BOLUS (SEPSIS)
1000.0000 mL | Freq: Once | INTRAVENOUS | Status: AC
  Administered 2015-11-25: 1000 mL via INTRAVENOUS

## 2015-11-25 MED ORDER — ONDANSETRON HCL 4 MG PO TABS: 4.0000 mg | ORAL_TABLET | Freq: Four times a day (QID) | ORAL | Status: DC

## 2015-11-25 NOTE — Discharge Instructions (Signed)

## 2015-11-25 NOTE — ED Notes (Signed)
Patient Alert and oriented X4. Stable and ambulatory. Patient verbalized understanding of the discharge instructions.  Patient belongings were taken by the patient.  
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
## 2016-05-31 ENCOUNTER — Emergency Department (HOSPITAL_COMMUNITY): Payer: Medicaid Other

## 2016-05-31 ENCOUNTER — Encounter (HOSPITAL_COMMUNITY): Payer: Self-pay | Admitting: *Deleted

## 2016-05-31 ENCOUNTER — Emergency Department (HOSPITAL_COMMUNITY)
Admission: EM | Admit: 2016-05-31 | Discharge: 2016-05-31 | Disposition: A | Discharge status: Home / Self Care | Payer: Medicaid Other | Attending: Emergency Medicine | Admitting: Emergency Medicine

## 2016-05-31 DIAGNOSIS — Z87891 Personal history of nicotine dependence: Secondary | ICD-10-CM | POA: Insufficient documentation

## 2016-05-31 DIAGNOSIS — R0789 Other chest pain: Secondary | ICD-10-CM | POA: Insufficient documentation

## 2016-05-31 DIAGNOSIS — R109 Unspecified abdominal pain: Secondary | ICD-10-CM | POA: Diagnosis present

## 2016-05-31 LAB — CBC
HEMATOCRIT: 37.6 % (ref 36.0–46.0)
Hemoglobin: 12.5 g/dL (ref 12.0–15.0)
MCH: 30.3 pg (ref 26.0–34.0)
MCHC: 33.2 g/dL (ref 30.0–36.0)
MCV: 91.3 fL (ref 78.0–100.0)
Platelets: 211 10*3/uL (ref 150–400)
RBC: 4.12 MIL/uL (ref 3.87–5.11)
RDW: 13 % (ref 11.5–15.5)
WBC: 9.5 10*3/uL (ref 4.0–10.5)

## 2016-05-31 LAB — URINALYSIS, ROUTINE W REFLEX MICROSCOPIC
Bilirubin Urine: NEGATIVE
Glucose, UA: NEGATIVE mg/dL
HGB URINE DIPSTICK: NEGATIVE
Ketones, ur: NEGATIVE mg/dL
NITRITE: NEGATIVE
PH: 6 (ref 5.0–8.0)
Protein, ur: NEGATIVE mg/dL
SPECIFIC GRAVITY, URINE: 1.025 (ref 1.005–1.030)

## 2016-05-31 LAB — BASIC METABOLIC PANEL
Anion gap: 6 (ref 5–15)
BUN: 7 mg/dL (ref 6–20)
CHLORIDE: 104 mmol/L (ref 101–111)
CO2: 26 mmol/L (ref 22–32)
Calcium: 9.1 mg/dL (ref 8.9–10.3)
Creatinine, Ser: 0.67 mg/dL (ref 0.44–1.00)
GFR calc non Af Amer: >60 mL/min (ref >60)
Glucose, Bld: 88 mg/dL (ref 65–99)
POTASSIUM: 4 mmol/L (ref 3.5–5.1)
SODIUM: 136 mmol/L (ref 135–145)

## 2016-05-31 LAB — I-STAT BETA HCG BLOOD, ED (MC, WL, AP ONLY)

## 2016-05-31 LAB — D-DIMER, QUANTITATIVE (NOT AT ARMC)

## 2016-05-31 LAB — I-STAT TROPONIN, ED: Troponin i, poc: 0 ng/mL (ref 0.00–0.08)

## 2016-05-31 MED ORDER — NAPROXEN 250 MG PO TABS
500.0000 mg | ORAL_TABLET | Freq: Once | ORAL | Status: AC
  Administered 2016-05-31: 500 mg via ORAL
  Filled 2016-05-31: qty 2

## 2016-05-31 MED ORDER — NAPROXEN 500 MG PO TABS: 500.0000 mg | ORAL_TABLET | Freq: Two times a day (BID) | ORAL | 0 refills | Status: DC

## 2016-05-31 MED ORDER — CEFTRIAXONE SODIUM 250 MG IJ SOLR
250.0000 mg | Freq: Once | INTRAMUSCULAR | Status: AC
  Administered 2016-05-31: 250 mg via INTRAMUSCULAR
  Filled 2016-05-31: qty 250

## 2016-05-31 MED ORDER — METRONIDAZOLE 500 MG PO TABS
2000.0000 mg | ORAL_TABLET | Freq: Once | ORAL | Status: AC
  Administered 2016-05-31: 2000 mg via ORAL
  Filled 2016-05-31: qty 4

## 2016-05-31 MED ORDER — AZITHROMYCIN 250 MG PO TABS
1000.0000 mg | ORAL_TABLET | Freq: Once | ORAL | Status: AC
  Administered 2016-05-31: 1000 mg via ORAL
  Filled 2016-05-31: qty 4

## 2016-05-31 MED ORDER — STERILE WATER FOR INJECTION IJ SOLN
INTRAMUSCULAR | Status: AC
  Administered 2016-05-31: 0.9 mL
  Filled 2016-05-31: qty 10

## 2016-05-31 NOTE — ED Triage Notes (Signed)
Pt reports recent pains to right side. Now has increase in pain to right rib and chest area. Pain increases with movement and breathing. Denies recent cough. Airway intact and ekg done.

## 2016-05-31 NOTE — Discharge Instructions (Signed)
°  All the results in the ER are normal, labs and imaging. We are not sure what is causing your symptoms. The workup in the ER is not complete, and is limited to screening for life threatening and emergent conditions only, so please see a primary care doctor for further evaluation.  Please return to the ER if you have worsening chest pain, shortness of breath, pain radiating to your jaw, shoulder, or back, sweats or fainting.   Please return to the ER if your symptoms worsen; you have increased pain, fevers, chills, inability to keep any medications down, confusion. Otherwise see the outpatient doctor as requested.

## 2016-05-31 NOTE — ED Notes (Signed)
ED Provider at bedside. 

## 2016-05-31 NOTE — ED Provider Notes (Signed)
[facility]-EMERGENCY DEPT Provider Note   CSN: [id number] Arrival date & time: 05/31/16  1321   By signing my name below, I, Teofilo PodMatthew P. Jamison, attest that this documentation has been prepared under the direction and in the presence of Derwood KaplanAnkit Vannia Pola, MD . Electronically Signed: Teofilo PodMatthew P. Jamison, ED Scribe. 05/31/2016. 7:20 PM.   History   Chief Complaint Chief Complaint  Patient presents with  . Chest Pain  . Flank Pain   The history is provided by the patient. No language interpreter was used.   HPI Comments:  Heather Fox is a 21 y.o. female who presents to the Emergency Department complaining of worsening, constant right-sided chest and rib pain x 2 weeks. Pt states that the pain was initially minor, but now has worsened and is radiating to the back. She describes the pain as throbbing and rates the pain at 7/10. Pt states that the pain is worse with movement and breathing. Pt complains of associated right torso weakness due to pain. Pt took Aleve with mild, temporary relief. Pt denies any hx of abdominal surgeries, history of DVT/PE, long travel, or chance of pregnancy. She does not take birth control. Pt denies cough, nausea, vomiting, diarrhea, vaginal discharge, painful urination, and hematuria.  History reviewed. No pertinent past medical history.  Patient Active Problem List   Diagnosis Date Noted  . Active labor 01/11/2015  . GERD (gastroesophageal reflux disease) 12/21/2014  . Rh negative state in antepartum period 11/19/2014  . Drug use affecting pregnancy in second trimester, antepartum 07/15/2014  . Supervision of normal subsequent pregnancy 07/08/2014    Past Surgical History:  Procedure Laterality Date  . NO PAST SURGERIES      OB History    Gravida Para Term Preterm AB Living   3 2 2     2    SAB TAB Ectopic Multiple Live Births         0 2       Home Medications    Prior to Admission medications   Medication Sig Start Date End Date Taking?  Authorizing Provider  loperamide (IMODIUM) 2 MG capsule Take 1 capsule (2 mg total) by mouth as needed for diarrhea or loose stools. 05/17/15   Shon Batonourtney F Horton, MD  naproxen (NAPROSYN) 500 MG tablet Take 1 tablet (500 mg total) by mouth 2 (two) times daily. 05/31/16   Derwood KaplanAnkit Meenakshi Sazama, MD  ondansetron (ZOFRAN) 4 MG tablet Take 1 tablet (4 mg total) by mouth every 6 (six) hours. 11/25/15   Elpidio AnisShari Upstill, PA-C  ondansetron (ZOFRAN-ODT) 4 MG disintegrating tablet Take 1 tablet (4 mg total) by mouth every 8 (eight) hours as needed for nausea. 05/17/15   Shon Batonourtney F Horton, MD  oxyCODONE-acetaminophen (ROXICET) 5-325 MG per tablet Take 1 tablet by mouth every 6 (six) hours as needed for severe pain. 01/12/15   Marquette SaaAbigail Joseph Lancaster, MD  Prenatal Vit-Fe Fumarate-FA (PRENATAL VITAMINS) 28-0.8 MG TABS Take 1 tablet by mouth daily. 07/08/14   Misty StanleyLisa A Leftwich-Kirby, CNM  ranitidine (ZANTAC) 150 MG tablet Take 1 tablet (150 mg total) by mouth 2 (two) times daily. 12/21/14   Kathrynn RunningNoah Bedford Wouk, MD    Family History Family History  Problem Relation Age of Onset  . Cancer Neg Hx   . Diabetes Neg Hx   . Heart disease Neg Hx   . Hypertension Neg Hx   . Stroke Neg Hx   . Hearing loss Neg Hx     Social History Social History  Substance Use Topics  .  Smoking status: Former Smoker    Packs/day: 1.00    Types: Cigarettes  . Smokeless tobacco: Never Used     Comment: quit 2015  . Alcohol use No     Allergies   Patient has no known allergies.   Review of Systems Review of Systems  Respiratory: Negative for cough.   Cardiovascular: Positive for chest pain.  Gastrointestinal: Negative for diarrhea, nausea and vomiting.  Genitourinary: Positive for flank pain. Negative for dysuria, hematuria and vaginal discharge.  Neurological: Positive for weakness.  All other systems reviewed and are negative.    Physical Exam Updated Vital Signs BP (!) 92/53 (BP Location: Left Arm)   Pulse 85   Temp 98.2 F  (36.8 C) (Oral)   Resp 12   LMP 05/09/2016   SpO2 100%   Physical Exam  Constitutional: She appears well-developed and well-nourished. No distress.  HENT:  Head: Normocephalic and atraumatic.  Eyes: Conjunctivae are normal.  Cardiovascular: Normal rate and regular rhythm.   Pulmonary/Chest: Effort normal and breath sounds normal.  Lungs clear.  Abdominal: She exhibits no distension.  Right flank tenderness and tenderness over right posterior thoracic region.   Neurological: She is alert.  Skin: Skin is warm and dry.  Psychiatric: She has a normal mood and affect.  Nursing note and vitals reviewed.    ED Treatments / Results  DIAGNOSTIC STUDIES:  Oxygen Saturation is 100% on RA, normal by my interpretation.    COORDINATION OF CARE:  7:20 PM Discussed treatment plan with pt at bedside and pt agreed to plan.   Labs (all labs ordered are listed, but only abnormal results are displayed) Labs Reviewed  URINALYSIS, ROUTINE W REFLEX MICROSCOPIC - Abnormal; Notable for the following:       Result Value   APPearance HAZY (*)    Leukocytes, UA LARGE (*)    Bacteria, UA RARE (*)    Squamous Epithelial / LPF 6-30 (*)    All other components within normal limits  BASIC METABOLIC PANEL  CBC  D-DIMER, QUANTITATIVE (NOT AT [facility])  I-STAT TROPOININ, ED  I-STAT BETA HCG BLOOD, ED ([facility], [facility], [facility] ONLY)    EKG  EKG Interpretation  Date/Time:  Wednesday May 31 2016 13:29:59 EST Ventricular Rate:  100 PR Interval:  154 QRS Duration: 78 QT Interval:  330 QTC Calculation: 425 R Axis:   51 Text Interpretation:  Normal sinus rhythm Normal ECG No acute changes No significant change since last tracing Confirmed by Rhunette Croft, MD, Janey Genta [phone]) on 05/31/2016 7:05:01 PM       Radiology Dg Chest 2 View  Result Date: 05/31/2016 CLINICAL DATA:  Central chest pain, left lower rib pain EXAM: CHEST  2 VIEW COMPARISON:  12/12/2013 FINDINGS: Cardiomediastinal silhouette is stable. No  infiltrate or pleural effusion. No pulmonary edema. Bony thorax is unremarkable. IMPRESSION: No active cardiopulmonary disease. Electronically Signed   By: Natasha Mead M.D.   On: 05/31/2016 13:55    Procedures Procedures (including critical care time)  Medications Ordered in ED Medications  naproxen (NAPROSYN) tablet 500 mg (500 mg Oral Given 05/31/16 2038)  metroNIDAZOLE (FLAGYL) tablet 2,000 mg (2,000 mg Oral Given 05/31/16 2134)  cefTRIAXone (ROCEPHIN) injection 250 mg (250 mg Intramuscular Given 05/31/16 2137)  azithromycin (ZITHROMAX) tablet 1,000 mg (1,000 mg Oral Given 05/31/16 2135)  sterile water (preservative free) injection (0.9 mLs  Given 05/31/16 2141)     Initial Impression / Assessment and Plan / ED Course  I have reviewed the triage vital signs  and the nursing notes.  Pertinent labs & imaging results that were available during my care of the patient were reviewed by me and considered in my medical decision making (see chart for details).  Clinical Course    Pt comes in with cc of flank pain, R and R chest pain. Pain is atypical. There is pleuritic component. Pt will need dimer to r.o PE. Pt will get UA. She has no signs consistent with renal colic. No uti like sx. Cr and CXR are normal. If dimer neg, pt will be discharged.   Final Clinical Impressions(s) / ED Diagnoses   Final diagnoses:  Flank pain    New Prescriptions Discharge Medication List as of 05/31/2016  9:16 PM    START taking these medications   Details  naproxen (NAPROSYN) 500 MG tablet Take 1 tablet (500 mg total) by mouth 2 (two) times daily., Starting Wed 05/31/2016, Print       I personally performed the services described in this documentation, which was scribed in my presence. The recorded information has been reviewed and is accurate.    Derwood Kaplan, MD 06/02/16 [phone]

## 2016-09-29 ENCOUNTER — Emergency Department (HOSPITAL_COMMUNITY): Payer: Medicaid Other

## 2016-09-29 ENCOUNTER — Encounter (HOSPITAL_COMMUNITY): Payer: Self-pay | Admitting: Emergency Medicine

## 2016-09-29 ENCOUNTER — Emergency Department (HOSPITAL_COMMUNITY)
Admission: EM | Admit: 2016-09-29 | Discharge: 2016-09-29 | Disposition: A | Discharge status: Home / Self Care | Payer: Medicaid Other | Attending: Emergency Medicine | Admitting: Emergency Medicine

## 2016-09-29 DIAGNOSIS — K219 Gastro-esophageal reflux disease without esophagitis: Secondary | ICD-10-CM

## 2016-09-29 DIAGNOSIS — Z87891 Personal history of nicotine dependence: Secondary | ICD-10-CM | POA: Insufficient documentation

## 2016-09-29 LAB — I-STAT CHEM 8, ED
BUN: 14 mg/dL (ref 6–20)
Calcium, Ion: 1.19 mmol/L (ref 1.15–1.40)
Chloride: 104 mmol/L (ref 101–111)
Creatinine, Ser: 0.8 mg/dL (ref 0.44–1.00)
Glucose, Bld: 83 mg/dL (ref 65–99)
HEMATOCRIT: 39 % (ref 36.0–46.0)
Hemoglobin: 13.3 g/dL (ref 12.0–15.0)
POTASSIUM: 4.2 mmol/L (ref 3.5–5.1)
SODIUM: 141 mmol/L (ref 135–145)
TCO2: 26 mmol/L (ref 0–100)

## 2016-09-29 LAB — I-STAT TROPONIN, ED: Troponin i, poc: 0 ng/mL (ref 0.00–0.08)

## 2016-09-29 LAB — I-STAT BETA HCG BLOOD, ED (MC, WL, AP ONLY): I-stat hCG, quantitative: <5 m[IU]/mL (ref <5)

## 2016-09-29 MED ORDER — ACETAMINOPHEN 500 MG PO TABS
1000.0000 mg | ORAL_TABLET | Freq: Once | ORAL | Status: AC
  Administered 2016-09-29: 1000 mg via ORAL
  Filled 2016-09-29: qty 2

## 2016-09-29 MED ORDER — OMEPRAZOLE 20 MG PO CPDR: 20.0000 mg | DELAYED_RELEASE_CAPSULE | Freq: Every day | ORAL | 0 refills | Status: DC

## 2016-09-29 MED ORDER — IBUPROFEN 800 MG PO TABS
800.0000 mg | ORAL_TABLET | Freq: Once | ORAL | Status: DC
  Filled 2016-09-29: qty 1

## 2016-09-29 MED ORDER — GI COCKTAIL ~~LOC~~
30.0000 mL | Freq: Once | ORAL | Status: AC
  Administered 2016-09-29: 30 mL via ORAL
  Filled 2016-09-29: qty 30

## 2016-09-29 NOTE — Discharge Instructions (Signed)
You are not having a heart attack and we did not find any dangerous causes of your chest pain and shortness of breath.  With the problems you've been having swallowing, I've prescribed you a medicine to take for acid reflux.    Please schedule follow-up appointments with [facility] and Gastroenterology.

## 2016-09-29 NOTE — ED Triage Notes (Signed)
Pt reports left sided chest pain and sob that she states she has had since 7th grade. Pt a/ox4, resp e/u, nad, denies cough.

## 2016-09-29 NOTE — ED Provider Notes (Signed)
[facility]-EMERGENCY DEPT Provider Note   CSN: [id number] Arrival date & time: 09/29/16  0706     History   Chief Complaint Chief Complaint  Patient presents with  . Chest Pain  . Shortness of Breath    HPI Heather Fox is a 21 y.o. female with history of chronic chest discomfort who presents with 3 days of worsening chest pain and dyspnea.  For the past 2-3 days she has had intense, intermittent chest pain "like lightning" that has been substernal, over left anterior lower chest, or right lateral chest.  Worse with deep breaths and movement, better with rest and trying to be calm.  She has also been more fatigued recently.  Sometimes feels like food gets stuck in her throat and has sour taste in her mouth.  She reports that for abut 10 years she has had waxing and waning migratory chest pain associated with dyspnea and anxiety.  It gets worse a few times a year and she comes to the ED.  Does not have a PCP.  She has had irregular periods for the past several months, including heavy vaginal bleeding for the last 3 days.  She is a smoker, no recent history of travel/immobilization, surgery, no birth control, no Hx of clots.  HPI  History reviewed. No pertinent past medical history.  Patient Active Problem List   Diagnosis Date Noted  . Active labor 01/11/2015  . GERD (gastroesophageal reflux disease) 12/21/2014  . Rh negative state in antepartum period 11/19/2014  . Drug use affecting pregnancy in second trimester, antepartum 07/15/2014  . Supervision of normal subsequent pregnancy 07/08/2014    Past Surgical History:  Procedure Laterality Date  . NO PAST SURGERIES      OB History    Gravida Para Term Preterm AB Living   3 2 2     2    SAB TAB Ectopic Multiple Live Births         0 2       Home Medications    Prior to Admission medications   Medication Sig Start Date End Date Taking? Authorizing Provider  ibuprofen (ADVIL,MOTRIN) 200 MG tablet Take 200 mg by  mouth every 6 (six) hours as needed for mild pain.   Yes [provider]  omeprazole (PRILOSEC) 20 MG capsule Take 1 capsule (20 mg total) by mouth daily. 09/29/16 10/29/16  Alm Bustard, MD  Prenatal Vit-Fe Fumarate-FA (PRENATAL VITAMINS) 28-0.8 MG TABS Take 1 tablet by mouth daily. Patient not taking: Reported on 09/29/2016 07/08/14   Hurshel Party, CNM    Family History Family History  Problem Relation Age of Onset  . Cancer Neg Hx   . Diabetes Neg Hx   . Heart disease Neg Hx   . Hypertension Neg Hx   . Stroke Neg Hx   . Hearing loss Neg Hx     Social History Social History  Substance Use Topics  . Smoking status: Former Smoker    Packs/day: 1.00    Types: Cigarettes  . Smokeless tobacco: Never Used     Comment: quit 2015  . Alcohol use No     Allergies   Patient has no known allergies.   Review of Systems Review of Systems  Constitutional: Positive for chills and diaphoresis. Negative for fever.  HENT: Negative for rhinorrhea and sneezing.   Respiratory: Positive for shortness of breath. Negative for cough and wheezing.   Cardiovascular: Positive for chest pain. Negative for palpitations and leg swelling.  Gastrointestinal: Negative  for constipation, diarrhea, nausea and vomiting.  Genitourinary: Positive for menstrual problem and vaginal bleeding. Negative for dysuria and hematuria.     Physical Exam Updated Vital Signs BP 126/75   Pulse 73   Temp 98 F (36.7 C) (Oral)   Resp (!) 25   LMP 09/29/2016   SpO2 100%   Physical Exam  Constitutional: She is oriented to person, place, and time. She appears well-developed and well-nourished. No distress.  Eyes: Conjunctivae are normal. No scleral icterus.  Cardiovascular: Normal rate and regular rhythm.   Pulmonary/Chest: Effort normal and breath sounds normal. No respiratory distress. She has no wheezes. She has no rales.  Abdominal: Soft. She exhibits no distension. There is no tenderness.   Musculoskeletal: She exhibits no edema or tenderness.  Neurological: She is alert and oriented to person, place, and time.  Skin: Skin is warm and dry. No pallor.  Psychiatric: Her behavior is normal.  Mood and affect mildly anxious     ED Treatments / Results  Labs (all labs ordered are listed, but only abnormal results are displayed) Labs Reviewed  I-STAT BETA HCG BLOOD, ED ([facility], [facility], [facility] ONLY)  I-STAT TROPOININ, ED  I-STAT CHEM 8, ED    EKG  EKG Interpretation None       Radiology Dg Chest 2 View  Result Date: 09/29/2016 CLINICAL DATA:  Chest pain.  Dyspnea. EXAM: CHEST  2 VIEW COMPARISON:  Two-view chest x-ray 09/29/16. FINDINGS: The heart size and mediastinal contours are within normal limits. Both lungs are clear. The visualized skeletal structures are unremarkable. IMPRESSION: Negative two view chest x-ray Electronically Signed   By: Marin Roberts M.D.   On: 09/29/2016 08:23    Procedures Procedures (including critical care time)  Medications Ordered in ED Medications  ibuprofen (ADVIL,MOTRIN) tablet 800 mg (0 mg Oral Hold 09/29/16 0750)  acetaminophen (TYLENOL) tablet 1,000 mg (1,000 mg Oral Given 09/29/16 0750)  gi cocktail (Maalox,Lidocaine,Donnatal) (30 mLs Oral Given 09/29/16 0804)     Initial Impression / Assessment and Plan / ED Course  I have reviewed the triage vital signs and the nursing notes.  Pertinent labs & imaging results that were available during my care of the patient were reviewed by me and considered in my medical decision making (see chart for details).  Healthy 21 yo woman with worsening of her chronic chest discomfort.  Her chest pain is highly atypical of angina, EKG without ischemic changes, nonspecific anterior and inferior ST flattening, similar to prior, HEART score 1.  Well's low probability of PE, PERC rules out PE.  Her chest pain may be due to anxiety, less likely MSK without tenderness to palpation or reproducibility with  movement.  Could be anemic with heavy periods.  Possible GERD. -NSAIDs, tylenol, GI cocktail -Hgb and electrolytes -Trop x1, D-dimer -hCG -CXR     Clinical Course as of Sep 29 901  Fri Sep 29, 2016  0825 Hgb normal, not pregnant, troponin negative, normal CXR.  Not ACS or symptomatic anemia.  [MO]  0850 Reports chest pain and shortness of breath are getting more intense, retching after GI cocktail, feeling more anxious and is tearful.  She thinks going home to sleep is best way to feel better, has been able to keep down sips of water.  [MO]  [phone] Able to drink sips of water, wants to go home and sleep.  [MO]    Clinical Course User Index [MO] Alm Bustard, MD     Final Clinical Impressions(s) / ED  Diagnoses   Final diagnoses:  Gastroesophageal reflux disease, esophagitis presence not specified    New Prescriptions New Prescriptions   OMEPRAZOLE (PRILOSEC) 20 MG CAPSULE    Take 1 capsule (20 mg total) by mouth daily.     Alm Bustard'Sullivan, Rashee Marschall, MD 09/29/16 [id number]    Loren RacerYelverton, David, MD 10/04/16 1440

## 2017-01-12 ENCOUNTER — Encounter (HOSPITAL_COMMUNITY): Payer: Self-pay

## 2017-01-12 ENCOUNTER — Inpatient Hospital Stay (HOSPITAL_COMMUNITY)
Admission: AD | Admit: 2017-01-12 | Discharge: 2017-01-12 | Disposition: A | Discharge status: Home / Self Care | Payer: Medicaid Other | Source: Ambulatory Visit | Attending: Family Medicine | Admitting: Family Medicine

## 2017-01-12 DIAGNOSIS — K219 Gastro-esophageal reflux disease without esophagitis: Secondary | ICD-10-CM

## 2017-01-12 DIAGNOSIS — K5900 Constipation, unspecified: Secondary | ICD-10-CM | POA: Insufficient documentation

## 2017-01-12 DIAGNOSIS — N946 Dysmenorrhea, unspecified: Secondary | ICD-10-CM

## 2017-01-12 DIAGNOSIS — R197 Diarrhea, unspecified: Secondary | ICD-10-CM | POA: Insufficient documentation

## 2017-01-12 DIAGNOSIS — B9689 Other specified bacterial agents as the cause of diseases classified elsewhere: Secondary | ICD-10-CM

## 2017-01-12 DIAGNOSIS — R5383 Other fatigue: Secondary | ICD-10-CM | POA: Insufficient documentation

## 2017-01-12 DIAGNOSIS — N760 Acute vaginitis: Secondary | ICD-10-CM

## 2017-01-12 DIAGNOSIS — Z87891 Personal history of nicotine dependence: Secondary | ICD-10-CM | POA: Insufficient documentation

## 2017-01-12 DIAGNOSIS — R109 Unspecified abdominal pain: Secondary | ICD-10-CM | POA: Insufficient documentation

## 2017-01-12 DIAGNOSIS — H6121 Impacted cerumen, right ear: Secondary | ICD-10-CM

## 2017-01-12 DIAGNOSIS — R531 Weakness: Secondary | ICD-10-CM | POA: Insufficient documentation

## 2017-01-12 DIAGNOSIS — H9201 Otalgia, right ear: Secondary | ICD-10-CM | POA: Insufficient documentation

## 2017-01-12 DIAGNOSIS — N920 Excessive and frequent menstruation with regular cycle: Secondary | ICD-10-CM | POA: Insufficient documentation

## 2017-01-12 HISTORY — DX: Gastro-esophageal reflux disease without esophagitis: K21.9

## 2017-01-12 LAB — COMPREHENSIVE METABOLIC PANEL
ALT: 12 U/L — AB (ref 14–54)
ANION GAP: 7 (ref 5–15)
AST: 19 U/L (ref 15–41)
Albumin: 4.2 g/dL (ref 3.5–5.0)
Alkaline Phosphatase: 52 U/L (ref 38–126)
BUN: 10 mg/dL (ref 6–20)
CHLORIDE: 103 mmol/L (ref 101–111)
CO2: 28 mmol/L (ref 22–32)
CREATININE: 0.73 mg/dL (ref 0.44–1.00)
Calcium: 9.2 mg/dL (ref 8.9–10.3)
Glucose, Bld: 83 mg/dL (ref 65–99)
Potassium: 4.3 mmol/L (ref 3.5–5.1)
Sodium: 138 mmol/L (ref 135–145)
Total Bilirubin: 0.6 mg/dL (ref 0.3–1.2)
Total Protein: 7.3 g/dL (ref 6.5–8.1)

## 2017-01-12 LAB — WET PREP, GENITAL
SPERM: NONE SEEN
TRICH WET PREP: NONE SEEN
YEAST WET PREP: NONE SEEN

## 2017-01-12 LAB — URINALYSIS, ROUTINE W REFLEX MICROSCOPIC
BILIRUBIN URINE: NEGATIVE
GLUCOSE, UA: NEGATIVE mg/dL
HGB URINE DIPSTICK: NEGATIVE
KETONES UR: NEGATIVE mg/dL
NITRITE: NEGATIVE
PH: 6 (ref 5.0–8.0)
Protein, ur: NEGATIVE mg/dL
Specific Gravity, Urine: 1.025 (ref 1.005–1.030)

## 2017-01-12 LAB — POCT PREGNANCY, URINE: Preg Test, Ur: NEGATIVE

## 2017-01-12 LAB — CBC
HCT: 39.6 % (ref 36.0–46.0)
Hemoglobin: 13.2 g/dL (ref 12.0–15.0)
MCH: 30.7 pg (ref 26.0–34.0)
MCHC: 33.3 g/dL (ref 30.0–36.0)
MCV: 92.1 fL (ref 78.0–100.0)
PLATELETS: 203 10*3/uL (ref 150–400)
RBC: 4.3 MIL/uL (ref 3.87–5.11)
RDW: 13 % (ref 11.5–15.5)
WBC: 7.7 10*3/uL (ref 4.0–10.5)

## 2017-01-12 MED ORDER — METRONIDAZOLE 500 MG PO TABS: 500.0000 mg | ORAL_TABLET | Freq: Two times a day (BID) | ORAL | 0 refills | Status: DC

## 2017-01-12 MED ORDER — PROMETHAZINE HCL 25 MG PO TABS: 12.5000 mg | ORAL_TABLET | Freq: Four times a day (QID) | ORAL | 0 refills | Status: DC | PRN

## 2017-01-12 MED ORDER — KETOROLAC TROMETHAMINE 60 MG/2ML IM SOLN
60.0000 mg | Freq: Once | INTRAMUSCULAR | Status: AC
  Administered 2017-01-12: 60 mg via INTRAMUSCULAR
  Filled 2017-01-12: qty 2

## 2017-01-12 NOTE — MAU Provider Note (Signed)
History  Chief Complaint:  Abdominal Pain and Fatigue  Heather Fox is a 21 y.o. G10P2002 female at presenting for stomach pain and cramping. She reports she usually has this type of pain before periods, but this is substantially worse. She reports current level of cramping and pain is normal 1 wk prior to beginning cycle, but the addition of a stomach ache is new. She reports no PO intake since 1 day due to increased pain. She reports diarrhea for the past few days, and now constipation. She denies headaches, fevers, chills, n/v. She reports weakness. She reports fatigue and decreased energy which started yesterday. She reports irregular periods with missed months and occassional 2x per month. She reports heavy bleeding with 5-6 pad/day for 5d-2 weeks per cycle. Severe cramping with periods is normal for her.  She also complains of R ear pain and feeling of fullness and decreased hearing in that ear for 1 week. She has tried Q-tips, saline irrigation without relief.   She currently takes iron supplements and has since she was 12. She has previously tried OCPs, depo, implanon for heavy, irregular, painful periods.  Obstetrical History: OB History    Gravida Para Term Preterm AB Living   2 2 2     2    SAB TAB Ectopic Multiple Live Births         0 2      Past Medical History: Past Medical History:  Diagnosis Date  . GERD (gastroesophageal reflux disease)     Past Surgical History: Past Surgical History:  Procedure Laterality Date  . NO PAST SURGERIES      Social History: Social History   Social History  . Marital status: Single    Spouse name: N/A  . Number of children: N/A  . Years of education: N/A   Social History Main Topics  . Smoking status: Former Smoker    Packs/day: 1.00    Types: Cigarettes  . Smokeless tobacco: Never Used     Comment: quit 2015  . Alcohol use No  . Drug use: No     Comment: last June 2016  . Sexual activity: Yes    Birth control/  protection: None   Other Topics Concern  . Not on file   Social History Narrative  . No narrative on file    Allergies: No Known Allergies  No prescriptions prior to admission.    Review of Systems  Pertinent pos/neg as indicated in HPI  Physical Exam  Blood pressure 106/64, pulse 83, temperature 98 F (36.7 C), resp. rate 16, height 5\' 5"  (1.651 m), weight 56.3 kg (124 lb 1.3 oz), last menstrual period 12/13/2016, unknown if currently breastfeeding. General appearance: alert, cooperative and no distress Lungs: normal work of breathing HEENT: Cerumen impaction appreciated on R ear, L TM gray, non-bulging without erythema Abdomen: soft, non-tender, no rebound, guarding, Neg murphy's, Neg McBurney's Extremities: no edema  Pelvic exam:  VULVA: normal appearing vulva with no masses, tenderness or lesions,  VAGINA: normal appearing vagina with normal color and discharge, no lesions,  CERVIX: cervical discharge present - white, creamy and mucoid, mild odor appreciated, mild cervical tenderness UTERUS: uterus is normal size, shape, consistency and nontender,  ADNEXA: normal adnexa in size, nontender and no masses appreciated, exam chaperoned by Sharen Counter CNM.  Cultures/Specimens: G/C and Wet mount collected    MAU Course   MDM CBC/CMP UA/ UPT History and physical examination Patient verbalized agreement and understanding with the assessment and plan.  Labs:  Results for orders placed or performed during the hospital encounter of 01/12/17 (from the past 24 hour(s))  Urinalysis, Routine w reflex microscopic     Status: Abnormal   Collection Time: 01/12/17  7:30 AM  Result Value Ref Range   Color, Urine YELLOW YELLOW   APPearance HAZY (A) CLEAR   Specific Gravity, Urine 1.025 1.005 - 1.030   pH 6.0 5.0 - 8.0   Glucose, UA NEGATIVE NEGATIVE mg/dL   Hgb urine dipstick NEGATIVE NEGATIVE   Bilirubin Urine NEGATIVE NEGATIVE   Ketones, ur NEGATIVE NEGATIVE mg/dL    Protein, ur NEGATIVE NEGATIVE mg/dL   Nitrite NEGATIVE NEGATIVE   Leukocytes, UA TRACE (A) NEGATIVE   RBC / HPF 0-5 0 - 5 RBC/hpf   WBC, UA 0-5 0 - 5 WBC/hpf   Bacteria, UA RARE (A) NONE SEEN   Squamous Epithelial / LPF 6-30 (A) NONE SEEN   Mucus PRESENT   Pregnancy, urine POC     Status: None   Collection Time: 01/12/17  7:37 AM  Result Value Ref Range   Preg Test, Ur NEGATIVE NEGATIVE  CBC     Status: None   Collection Time: 01/12/17  8:24 AM  Result Value Ref Range   WBC 7.7 4.0 - 10.5 K/uL   RBC 4.30 3.87 - 5.11 MIL/uL   Hemoglobin 13.2 12.0 - 15.0 g/dL   HCT 10.1 75.1 - 02.5 %   MCV 92.1 78.0 - 100.0 fL   MCH 30.7 26.0 - 34.0 pg   MCHC 33.3 30.0 - 36.0 g/dL   RDW 85.2 77.8 - 24.2 %   Platelets 203 150 - 400 K/uL  Comprehensive metabolic panel     Status: Abnormal   Collection Time: 01/12/17  8:24 AM  Result Value Ref Range   Sodium 138 135 - 145 mmol/L   Potassium 4.3 3.5 - 5.1 mmol/L   Chloride 103 101 - 111 mmol/L   CO2 28 22 - 32 mmol/L   Glucose, Bld 83 65 - 99 mg/dL   BUN 10 6 - 20 mg/dL   Creatinine, Ser 3.53 0.44 - 1.00 mg/dL   Calcium 9.2 8.9 - 61.4 mg/dL   Total Protein 7.3 6.5 - 8.1 g/dL   Albumin 4.2 3.5 - 5.0 g/dL   AST 19 15 - 41 U/L   ALT 12 (L) 14 - 54 U/L   Alkaline Phosphatase 52 38 - 126 U/L   Total Bilirubin 0.6 0.3 - 1.2 mg/dL   GFR calc non Af Amer >60 >60 mL/min   GFR calc Af Amer >60 >60 mL/min   Anion gap 7 5 - 15  Wet prep, genital     Status: Abnormal   Collection Time: 01/12/17  9:00 AM  Result Value Ref Range   Yeast Wet Prep HPF POC NONE SEEN NONE SEEN   Trich, Wet Prep NONE SEEN NONE SEEN   Clue Cells Wet Prep HPF POC PRESENT (A) NONE SEEN   WBC, Wet Prep HPF POC FEW (A) NONE SEEN   Sperm NONE SEEN     Imaging:  None Assessment and Plan   A: Norah Britain is a 20yoF G2P2 who presents with abdominal pain and fatigue. She reports long history of painful, irregular, heavy periods, refractory to treatment with an acute  worsening in stomach pain today. History and physical exam suggestive of Endometriosis. She also likely had an acute GI illness that is now resolving. Reassuring abdominal exam, CBC/CMP and history not suggestive of ovarian torsion,  appendicitis or other acute intraabdominal process. Physical exam and wet prep also suggestive of BV, which may help explain abdominal pain.  P:  Abdominal Pain:  IM toradol given for pain management and prescription provided for naproxen. Patient advised to use ibuprofen prn for pain control and to resume taking iron supplements for pain control.was given PO phenergan for nausea prn. Patient instructed to follow up with [facility] clinic to re-establish care and work up of dysmenorrhea and menorrhagia for possible endometriosis. Strict return precautions given regarding new or worsening symptoms, patient instructed to go to [facility] ED if severe symptoms occur or recur.  Bacterial Vaginosis: 500MG  Flagyl BID x 7 days for treatment. Return precautions given regarding new or worsening symptoms or no resolution of symptoms.  Cerumen Impaction: Patient advised to stop using Q-tips to clean ears and to try liquid docusate on a cotton ball or warm saltwater to irrigate the ear. Patient advised to visit urgent care for rapid removal of impaction.   Patient also requested PCP referral, so number for [facility] Work note requested and given.   Dannette Barbara, Medical Student 8/24/201810:36 AM  I confirm that I have verified the information documented in the medical student's note and that I have also personally performed the physical exam and all medical decision making activities.  Sharen Counter, CNM 8:48 PM

## 2017-01-12 NOTE — MAU Note (Addendum)
Patient presents with generalized abdominal for several days, feels extremely weak, unable to eat. Patient states she had 5 episodes of soft stools yesterday, none today. Patient is also complaining of not being able to hear out of her right ear (feels stopped up).

## 2017-01-15 LAB — GC/CHLAMYDIA PROBE AMP (~~LOC~~) NOT AT ARMC
CHLAMYDIA, DNA PROBE: NEGATIVE
NEISSERIA GONORRHEA: NEGATIVE

## 2017-03-13 ENCOUNTER — Encounter (HOSPITAL_COMMUNITY): Payer: Self-pay | Admitting: *Deleted

## 2017-03-13 ENCOUNTER — Inpatient Hospital Stay (HOSPITAL_COMMUNITY)
Admission: AD | Admit: 2017-03-13 | Discharge: 2017-03-13 | Disposition: A | Discharge status: Home / Self Care | Payer: Self-pay | Source: Ambulatory Visit | Attending: Obstetrics and Gynecology | Admitting: Obstetrics and Gynecology

## 2017-03-13 DIAGNOSIS — R112 Nausea with vomiting, unspecified: Secondary | ICD-10-CM

## 2017-03-13 DIAGNOSIS — O210 Mild hyperemesis gravidarum: Secondary | ICD-10-CM | POA: Insufficient documentation

## 2017-03-13 DIAGNOSIS — Z3201 Encounter for pregnancy test, result positive: Secondary | ICD-10-CM

## 2017-03-13 DIAGNOSIS — Z3A01 Less than 8 weeks gestation of pregnancy: Secondary | ICD-10-CM | POA: Insufficient documentation

## 2017-03-13 DIAGNOSIS — Z87891 Personal history of nicotine dependence: Secondary | ICD-10-CM | POA: Insufficient documentation

## 2017-03-13 DIAGNOSIS — O219 Vomiting of pregnancy, unspecified: Secondary | ICD-10-CM

## 2017-03-13 LAB — URINALYSIS, ROUTINE W REFLEX MICROSCOPIC
Bilirubin Urine: NEGATIVE
Glucose, UA: NEGATIVE mg/dL
Hgb urine dipstick: NEGATIVE
KETONES UR: NEGATIVE mg/dL
Nitrite: NEGATIVE
PH: 7 (ref 5.0–8.0)
PROTEIN: NEGATIVE mg/dL
Specific Gravity, Urine: 1.01 (ref 1.005–1.030)

## 2017-03-13 LAB — POCT PREGNANCY, URINE: Preg Test, Ur: POSITIVE — AB

## 2017-03-13 MED ORDER — VITAMIN B-6 25 MG PO TABS: 25.0000 mg | ORAL_TABLET | Freq: Every day | ORAL | 0 refills | Status: DC

## 2017-03-13 MED ORDER — DOXYLAMINE SUCCINATE (SLEEP) 25 MG PO TABS
25.0000 mg | ORAL_TABLET | Freq: Every evening | ORAL | 0 refills | Status: DC | PRN
Start: 2017-03-13 — End: 2017-04-24

## 2017-03-13 NOTE — MAU Provider Note (Signed)
History     CSN: [id number]  Arrival date and time: 03/13/17 1350   None     Chief Complaint  Patient presents with  . Nausea  . Possible Pregnancy   21 yo G3P2002 at 4744w2d based on LMP presents today for nausea and vomiting x 2 weeks. Has not tried anything for it. Nothing makes better or worse. No time of day is nausea worse. Denies fever, sweats, chills, diarrhea.     OB History    Gravida Para Term Preterm AB Living   3 2 2     2    SAB TAB Ectopic Multiple Live Births         0 2      Past Medical History:  Diagnosis Date  . GERD (gastroesophageal reflux disease)     Past Surgical History:  Procedure Laterality Date  . NO PAST SURGERIES      Family History  Problem Relation Age of Onset  . Cancer Neg Hx   . Diabetes Neg Hx   . Heart disease Neg Hx   . Hypertension Neg Hx   . Stroke Neg Hx   . Hearing loss Neg Hx     Social History  Substance Use Topics  . Smoking status: Former Smoker    Packs/day: 1.00    Types: Cigarettes  . Smokeless tobacco: Never Used     Comment: quit 2015  . Alcohol use No    Allergies: No Known Allergies  Prescriptions Prior to Admission  Medication Sig Dispense Refill Last Dose  . metroNIDAZOLE (FLAGYL) 500 MG tablet Take 1 tablet (500 mg total) by mouth 2 (two) times daily. 14 tablet 0   . promethazine (PHENERGAN) 25 MG tablet Take 0.5-1 tablets (12.5-25 mg total) by mouth every 6 (six) hours as needed for nausea. 30 tablet 0     Review of Systems  Constitutional: Negative for chills, fatigue and fever.  HENT: Negative for congestion and ear pain.   Eyes: Negative for discharge and itching.  Respiratory: Negative for cough and chest tightness.   Cardiovascular: Negative for chest pain and palpitations.  Gastrointestinal: Positive for nausea and vomiting. Negative for abdominal distention and abdominal pain.  Endocrine: Negative for cold intolerance and heat intolerance.  Genitourinary: Negative for dysuria and  flank pain.  Musculoskeletal: Negative for arthralgias and back pain.  Neurological: Negative for dizziness and syncope.  Psychiatric/Behavioral: Negative for agitation. The patient is not hyperactive.    Physical Exam   Blood pressure (!) 113/59, pulse (!) 101, temperature 98.4 F (36.9 C), temperature source Oral, resp. rate 19, height 5\' 5"  (1.651 m), weight 133 lb (60.3 kg), last menstrual period 02/25/2017, SpO2 100 %, unknown if currently breastfeeding.  Physical Exam  Constitutional: She is oriented to person, place, and time. She appears well-developed and well-nourished. No distress.  HENT:  Head: Normocephalic and atraumatic.  Eyes: Pupils are equal, round, and reactive to light. Conjunctivae are normal.  Neck: Normal range of motion. Neck supple.  Cardiovascular: Intact distal pulses.   Respiratory: Effort normal. No respiratory distress.  GI: Soft. She exhibits no distension.  Musculoskeletal: Normal range of motion. She exhibits no edema.  Neurological: She is alert and oriented to person, place, and time.  Skin: Skin is warm and dry. No erythema.  Psychiatric: She has a normal mood and affect. Her behavior is normal.    MAU Course  Procedures   Assessment and Plan  1. Positive pregnancy test- follow up outpatient for prenatal care  2. Nausea and vomiting- treat with doxylamine and vitamin b6  Amador Braddy 03/13/2017, 2:24 PM

## 2017-03-13 NOTE — MAU Note (Signed)
Pt reports she has been feeling nauseated off/on for 2 weeks, worsening today. Denies pain.

## 2017-03-13 NOTE — Discharge Instructions (Signed)
Nausea, Adult  Nausea is the feeling of an upset stomach or having to vomit. Nausea on its own is not usually a serious concern, but it may be an early sign of a more serious medical problem. As nausea gets worse, it can lead to vomiting. If vomiting develops, or if you are not able to drink enough fluids, you are at risk of becoming dehydrated. Dehydration can make you tired and thirsty, cause you to have a dry mouth, and decrease how often you urinate. Older adults and people with other diseases or a weak immune system are at higher risk for dehydration. The main goals of treating your nausea are:  · To limit repeated nausea episodes.  · To prevent vomiting and dehydration.    Follow these instructions at home:  Follow instructions from your health care provider about how to care for yourself at home.  Eating and drinking   Follow these recommendations as told by your health care provider:  · Take an oral rehydration solution (ORS). This is a drink that is sold at pharmacies and retail stores.  · Drink clear fluids in small amounts as you are able. Clear fluids include water, ice chips, diluted fruit juice, and low-calorie sports drinks.  · Eat bland, easy-to-digest foods in small amounts as you are able. These foods include bananas, applesauce, rice, lean meats, toast, and crackers.  · Avoid drinking fluids that contain a lot of sugar or caffeine, such as energy drinks, sports drinks, and soda.  · Avoid alcohol.  · Avoid spicy or fatty foods.    General instructions   · Drink enough fluid to keep your urine clear or pale yellow.  · Wash your hands often. If soap and water are not available, use hand sanitizer.  · Make sure that all people in your household wash their hands well and often.  · Rest at home while you recover.  · Take over-the-counter and prescription medicines only as told by your health care provider.  · Breathe slowly and deeply when you feel nauseous.  · Watch your condition for any  changes.  · Keep all follow-up visits as told by your health care provider. This is important.  Contact a health care provider if:  · You have a headache.  · You have new symptoms.  · Your nausea gets worse.  · You have a fever.  · You feel light-headed or dizzy.  · You vomit.  · You cannot keep fluids down.  Get help right away if:  · You have pain in your chest, neck, arm, or jaw.  · You feel extremely weak or you faint.  · You have vomit that is bright red or looks like coffee grounds.  · You have bloody or black stools or stools that look like tar.  · You have a severe headache, a stiff neck, or both.  · You have severe pain, cramping, or bloating in your abdomen.  · You have a rash.  · You have difficulty breathing or are breathing very quickly.  · Your heart is beating very quickly.  · Your skin feels cold and clammy.  · You feel confused.  · You have pain when you urinate.  · You have signs of dehydration, such as:  ? Dark urine, very little, or no urine.  ? Cracked lips.  ? Dry mouth.  ? Sunken eyes.  ? Sleepiness.  ? Weakness.  These symptoms may represent a serious problem that is an emergency. Do   not wait to see if the symptoms will go away. Get medical help right away. Call your local emergency services (911 in the [place] Do not drive yourself to the hospital.  This information is not intended to replace advice given to you by your health care provider. Make sure you discuss any questions you have with your health care provider.  Document Released: 06/15/2004 Document Revised: 10/11/2015 Document Reviewed: 01/12/2015  [employer]vier Interactive Patient Education © 2017 [employer]

## 2017-04-02 ENCOUNTER — Encounter (HOSPITAL_COMMUNITY): Payer: Self-pay

## 2017-04-02 ENCOUNTER — Inpatient Hospital Stay (HOSPITAL_COMMUNITY): Payer: Medicaid Other

## 2017-04-02 ENCOUNTER — Inpatient Hospital Stay (HOSPITAL_COMMUNITY)
Admission: AD | Admit: 2017-04-02 | Discharge: 2017-04-02 | Disposition: A | Discharge status: Home / Self Care | Payer: Medicaid Other | Source: Ambulatory Visit | Attending: Family Medicine | Admitting: Family Medicine

## 2017-04-02 DIAGNOSIS — B9689 Other specified bacterial agents as the cause of diseases classified elsewhere: Secondary | ICD-10-CM | POA: Insufficient documentation

## 2017-04-02 DIAGNOSIS — Z87891 Personal history of nicotine dependence: Secondary | ICD-10-CM | POA: Insufficient documentation

## 2017-04-02 DIAGNOSIS — O23591 Infection of other part of genital tract in pregnancy, first trimester: Secondary | ICD-10-CM | POA: Insufficient documentation

## 2017-04-02 DIAGNOSIS — R1030 Lower abdominal pain, unspecified: Secondary | ICD-10-CM | POA: Diagnosis present

## 2017-04-02 DIAGNOSIS — Z3A08 8 weeks gestation of pregnancy: Secondary | ICD-10-CM | POA: Insufficient documentation

## 2017-04-02 DIAGNOSIS — O26899 Other specified pregnancy related conditions, unspecified trimester: Secondary | ICD-10-CM

## 2017-04-02 DIAGNOSIS — O26891 Other specified pregnancy related conditions, first trimester: Secondary | ICD-10-CM

## 2017-04-02 DIAGNOSIS — R109 Unspecified abdominal pain: Secondary | ICD-10-CM

## 2017-04-02 DIAGNOSIS — N760 Acute vaginitis: Secondary | ICD-10-CM

## 2017-04-02 LAB — WET PREP, GENITAL
Sperm: NONE SEEN
TRICH WET PREP: NONE SEEN
YEAST WET PREP: NONE SEEN

## 2017-04-02 LAB — CBC
HCT: 35.3 % — ABNORMAL LOW (ref 36.0–46.0)
Hemoglobin: 11.9 g/dL — ABNORMAL LOW (ref 12.0–15.0)
MCH: 30.7 pg (ref 26.0–34.0)
MCHC: 33.7 g/dL (ref 30.0–36.0)
MCV: 91.2 fL (ref 78.0–100.0)
PLATELETS: 202 10*3/uL (ref 150–400)
RBC: 3.87 MIL/uL (ref 3.87–5.11)
RDW: 12.5 % (ref 11.5–15.5)
WBC: 9.4 10*3/uL (ref 4.0–10.5)

## 2017-04-02 LAB — URINALYSIS, ROUTINE W REFLEX MICROSCOPIC
Bilirubin Urine: NEGATIVE
Glucose, UA: NEGATIVE mg/dL
Hgb urine dipstick: NEGATIVE
KETONES UR: NEGATIVE mg/dL
Nitrite: NEGATIVE
Protein, ur: NEGATIVE mg/dL
SPECIFIC GRAVITY, URINE: 1.009 (ref 1.005–1.030)
pH: 7 (ref 5.0–8.0)

## 2017-04-02 LAB — HCG, QUANTITATIVE, PREGNANCY: HCG, BETA CHAIN, QUANT, S: 167932 m[IU]/mL — AB (ref <5)

## 2017-04-02 LAB — ABO/RH: ABO/RH(D): B NEG

## 2017-04-02 MED ORDER — METRONIDAZOLE 500 MG PO TABS
500.0000 mg | ORAL_TABLET | Freq: Two times a day (BID) | ORAL | 0 refills | Status: DC
Start: 2017-04-02 — End: 2017-04-24

## 2017-04-02 NOTE — Discharge Instructions (Signed)
Abdominal Pain During Pregnancy °Abdominal pain is common in pregnancy. Most of the time, it does not cause harm. There are many causes of abdominal pain. Some causes are more serious than others and sometimes the cause is not known. Abdominal pain can be a sign that something is very wrong with the pregnancy or the pain may have nothing to do with the pregnancy. Always tell your health care provider if you have any abdominal pain. °Follow these instructions at home: °· Do not have sex or put anything in your vagina until your symptoms go away completely. °· Watch your abdominal pain for any changes. °· Get plenty of rest until your pain improves. °· Drink enough fluid to keep your urine clear or pale yellow. °· Take over-the-counter or prescription medicines only as told by your health care provider. °· Keep all follow-up visits as told by your health care provider. This is important. °Contact a health care provider if: °· You have a fever. °· Your pain gets worse or you have cramping. °· Your pain continues after resting. °Get help right away if: °· You are bleeding, leaking fluid, or passing tissue from the vagina. °· You have vomiting or diarrhea that does not go away. °· You have painful or bloody urination. °· You notice a decrease in your baby's movements. °· You feel very weak or faint. °· You have shortness of breath. °· You develop a severe headache with abdominal pain. °· You have abnormal vaginal discharge with abdominal pain. °This information is not intended to replace advice given to you by your health care provider. Make sure you discuss any questions you have with your health care provider. °Document Released: 05/08/2005 Document Revised: 02/17/2016 Document Reviewed: 12/05/2012 °Elsevier Interactive Patient Education © 2018 [employer]. ° °First Trimester of Pregnancy °The first trimester of pregnancy is from week 1 until the end of week 13 (months 1 through 3). A week after a sperm fertilizes an  egg, the egg will implant on the wall of the uterus. This embryo will begin to develop into a baby. Genes from you and your partner will form the baby. The female genes will determine whether the baby will be a boy or a girl. At 6-8 weeks, the eyes and face will be formed, and the heartbeat can be seen on ultrasound. At the end of 12 weeks, all the baby's organs will be formed. °Now that you are pregnant, you will want to do everything you can to have a healthy baby. Two of the most important things are to get good prenatal care and to follow your health care provider's instructions. Prenatal care is all the medical care you receive before the baby's birth. This care will help prevent, find, and treat any problems during the pregnancy and childbirth. °Body changes during your first trimester °Your body goes through many changes during pregnancy. The changes vary from woman to woman. °· You may gain or lose a couple of pounds at first. °· You may feel sick to your stomach (nauseous) and you may throw up (vomit). If the vomiting is uncontrollable, call your health care provider. °· You may tire easily. °· You may develop headaches that can be relieved by medicines. All medicines should be approved by your health care provider. °· You may urinate more often. Painful urination may mean you have a bladder infection. °· You may develop heartburn as a result of your pregnancy. °· You may develop constipation because certain hormones are causing the muscles   that push stool through your intestines to slow down. °· You may develop hemorrhoids or swollen veins (varicose veins). °· Your breasts may begin to grow larger and become tender. Your nipples may stick out more, and the tissue that surrounds them (areola) may become darker. °· Your gums may bleed and may be sensitive to brushing and flossing. °· Dark spots or blotches (chloasma, mask of pregnancy) may develop on your face. This will likely fade after the baby is  born. °· Your menstrual periods will stop. °· You may have a loss of appetite. °· You may develop cravings for certain kinds of food. °· You may have changes in your emotions from day to day, such as being excited to be pregnant or being concerned that something may go wrong with the pregnancy and baby. °· You may have more vivid and strange dreams. °· You may have changes in your hair. These can include thickening of your hair, rapid growth, and changes in texture. Some women also have hair loss during or after pregnancy, or hair that feels dry or thin. Your hair will most likely return to normal after your baby is born. ° °What to expect at prenatal visits °During a routine prenatal visit: °· You will be weighed to make sure you and the baby are growing normally. °· Your blood pressure will be taken. °· Your abdomen will be measured to track your baby's growth. °· The fetal heartbeat will be listened to between weeks 10 and 14 of your pregnancy. °· Test results from any previous visits will be discussed. ° °Your health care provider may ask you: °· How you are feeling. °· If you are feeling the baby move. °· If you have had any abnormal symptoms, such as leaking fluid, bleeding, severe headaches, or abdominal cramping. °· If you are using any tobacco products, including cigarettes, chewing tobacco, and electronic cigarettes. °· If you have any questions. ° °Other tests that may be performed during your first trimester include: °· Blood tests to find your blood type and to check for the presence of any previous infections. The tests will also be used to check for low iron levels (anemia) and protein on red blood cells (Rh antibodies). Depending on your risk factors, or if you previously had diabetes during pregnancy, you may have tests to check for high blood sugar that affects pregnant women (gestational diabetes). °· Urine tests to check for infections, diabetes, or protein in the urine. °· An ultrasound to  confirm the proper growth and development of the baby. °· Fetal screens for spinal cord problems (spina bifida) and Down syndrome. °· HIV (human immunodeficiency virus) testing. Routine prenatal testing includes screening for HIV, unless you choose not to have this test. °· You may need other tests to make sure you and the baby are doing well. ° °Follow these instructions at home: °Medicines °· Follow your health care provider's instructions regarding medicine use. Specific medicines may be either safe or unsafe to take during pregnancy. °· Take a prenatal vitamin that contains at least 600 micrograms (mcg) of folic acid. °· If you develop constipation, try taking a stool softener if your health care provider approves. °Eating and drinking °· Eat a balanced diet that includes fresh fruits and vegetables, whole grains, good sources of protein such as meat, eggs, or tofu, and low-fat dairy. Your health care provider will help you determine the amount of weight gain that is right for you. °· Avoid raw meat and uncooked cheese.   These carry germs that can cause birth defects in the baby. °· Eating four or five small meals rather than three large meals a day may help relieve nausea and vomiting. If you start to feel nauseous, eating a few soda crackers can be helpful. Drinking liquids between meals, instead of during meals, also seems to help ease nausea and vomiting. °· Limit foods that are high in fat and processed sugars, such as fried and sweet foods. °· To prevent constipation: °? Eat foods that are high in fiber, such as fresh fruits and vegetables, whole grains, and beans. °? Drink enough fluid to keep your urine clear or pale yellow. °Activity °· Exercise only as directed by your health care provider. Most women can continue their usual exercise routine during pregnancy. Try to exercise for 30 minutes at least 5 days a week. Exercising will help you: °? Control your weight. °? Stay in shape. °? Be prepared for  labor and delivery. °· Experiencing pain or cramping in the lower abdomen or lower back is a good sign that you should stop exercising. Check with your health care provider before continuing with normal exercises. °· Try to avoid standing for long periods of time. Move your legs often if you must stand in one place for a long time. °· Avoid heavy lifting. °· Wear low-heeled shoes and practice good posture. °· You may continue to have sex unless your health care provider tells you not to. °Relieving pain and discomfort °· Wear a good support bra to relieve breast tenderness. °· Take warm sitz baths to soothe any pain or discomfort caused by hemorrhoids. Use hemorrhoid cream if your health care provider approves. °· Rest with your legs elevated if you have leg cramps or low back pain. °· If you develop varicose veins in your legs, wear support hose. Elevate your feet for 15 minutes, 3-4 times a day. Limit salt in your diet. °Prenatal care °· Schedule your prenatal visits by the twelfth week of pregnancy. They are usually scheduled monthly at first, then more often in the last 2 months before delivery. °· Write down your questions. Take them to your prenatal visits. °· Keep all your prenatal visits as told by your health care provider. This is important. °Safety °· Wear your seat belt at all times when driving. °· Make a list of emergency phone numbers, including numbers for family, friends, the hospital, and police and fire departments. °General instructions °· Ask your health care provider for a referral to a local prenatal education class. Begin classes no later than the beginning of month 6 of your pregnancy. °· Ask for help if you have counseling or nutritional needs during pregnancy. Your health care provider can offer advice or refer you to specialists for help with various needs. °· Do not use hot tubs, steam rooms, or saunas. °· Do not douche or use tampons or scented sanitary pads. °· Do not cross your legs  for long periods of time. °· Avoid cat litter boxes and soil used by cats. These carry germs that can cause birth defects in the baby and possibly loss of the fetus by miscarriage or stillbirth. °· Avoid all smoking, herbs, alcohol, and medicines not prescribed by your health care provider. Chemicals in these products affect the formation and growth of the baby. °· Do not use any products that contain nicotine or tobacco, such as cigarettes and e-cigarettes. If you need help quitting, ask your health care provider. You may receive counseling support and   other resources to help you quit.  Schedule a dentist appointment. At home, brush your teeth with a soft toothbrush and be gentle when you floss. Contact a health care provider if:  You have dizziness.  You have mild pelvic cramps, pelvic pressure, or nagging pain in the abdominal area.  You have persistent nausea, vomiting, or diarrhea.  You have a bad smelling vaginal discharge.  You have pain when you urinate.  You notice increased swelling in your face, hands, legs, or ankles.  You are exposed to fifth disease or chickenpox.  You are exposed to [place] measles (rubella) and have never had it. Get help right away if:  You have a fever.  You are leaking fluid from your vagina.  You have spotting or bleeding from your vagina.  You have severe abdominal cramping or pain.  You have rapid weight gain or loss.  You vomit blood or material that looks like coffee grounds.  You develop a severe headache.  You have shortness of breath.  You have any kind of trauma, such as from a fall or a car accident. Summary  The first trimester of pregnancy is from week 1 until the end of week 13 (months 1 through 3).  Your body goes through many changes during pregnancy. The changes vary from woman to woman.  You will have routine prenatal visits. During those visits, your health care provider will examine you, discuss any test results you  may have, and talk with you about how you are feeling. This information is not intended to replace advice given to you by your health care provider. Make sure you discuss any questions you have with your health care provider. Document Released: 05/02/2001 Document Revised: 04/19/2016 Document Reviewed: 04/19/2016 Elsevier Interactive Patient Education  2017 [employer] Inc.  Bacterial Vaginosis Bacterial vaginosis is a vaginal infection that occurs when the normal balance of bacteria in the vagina is disrupted. It results from an overgrowth of certain bacteria. This is the most common vaginal infection among women ages 215-44. Because bacterial vaginosis increases your risk for STIs (sexually transmitted infections), getting treated can help reduce your risk for chlamydia, gonorrhea, herpes, and HIV (human immunodeficiency virus). Treatment is also important for preventing complications in pregnant women, because this condition can cause an early (premature) delivery. What are the causes? This condition is caused by an increase in harmful bacteria that are normally present in small amounts in the vagina. However, the reason that the condition develops is not fully understood. What increases the risk? The following factors may make you more likely to develop this condition:  Having a new sexual partner or multiple sexual partners.  Having unprotected sex.  Douching.  Having an intrauterine device (IUD).  Smoking.  Drug and alcohol abuse.  Taking certain antibiotic medicines.  Being pregnant.  You cannot get bacterial vaginosis from toilet seats, bedding, swimming pools, or contact with objects around you. What are the signs or symptoms? Symptoms of this condition include:  Grey or white vaginal discharge. The discharge can also be watery or foamy.  A fish-like odor with discharge, especially after sexual intercourse or during menstruation.  Itching in and around the  vagina.  Burning or pain with urination.  Some women with bacterial vaginosis have no signs or symptoms. How is this diagnosed? This condition is diagnosed based on:  Your medical history.  A physical exam of the vagina.  Testing a sample of vaginal fluid under a microscope to look for a large amount  of bad bacteria or abnormal cells. Your health care provider may use a cotton swab or a small wooden spatula to collect the sample.  How is this treated? This condition is treated with antibiotics. These may be given as a pill, a vaginal cream, or a medicine that is put into the vagina (suppository). If the condition comes back after treatment, a second round of antibiotics may be needed. Follow these instructions at home: Medicines  Take over-the-counter and prescription medicines only as told by your health care provider.  Take or use your antibiotic as told by your health care provider. Do not stop taking or using the antibiotic even if you start to feel better. General instructions  If you have a female sexual partner, tell her that you have a vaginal infection. She should see her health care provider and be treated if she has symptoms. If you have a female sexual partner, he does not need treatment.  During treatment: ? Avoid sexual activity until you finish treatment. ? Do not douche. ? Avoid alcohol as directed by your health care provider. ? Avoid breastfeeding as directed by your health care provider.  Drink enough water and fluids to keep your urine clear or pale yellow.  Keep the area around your vagina and rectum clean. ? Wash the area daily with warm water. ? Wipe yourself from front to back after using the toilet.  Keep all follow-up visits as told by your health care provider. This is important. How is this prevented?  Do not douche.  Wash the outside of your vagina with warm water only.  Use protection when having sex. This includes latex condoms and dental  dams.  Limit how many sexual partners you have. To help prevent bacterial vaginosis, it is best to have sex with just one partner (monogamous).  Make sure you and your sexual partner are tested for STIs.  Wear cotton or cotton-lined underwear.  Avoid wearing tight pants and pantyhose, especially during summer.  Limit the amount of alcohol that you drink.  Do not use any products that contain nicotine or tobacco, such as cigarettes and e-cigarettes. If you need help quitting, ask your health care provider.  Do not use illegal drugs. Where to find more information:  Centers for Disease Control and Prevention: SolutionApps.co.zawww.cdc.gov/std  American Sexual Health Association (ASHA): www.ashastd.org  [place] Department of Health and Health and safety inspectorHuman Services, Office on Women's Health: ConventionalMedicines.siwww.womenshealth.gov/ or http://www.anderson-williamson.info/https://www.womenshealth.gov/a-z-topics/bacterial-vaginosis Contact a health care provider if:  Your symptoms do not improve, even after treatment.  You have more discharge or pain when urinating.  You have a fever.  You have pain in your abdomen.  You have pain during sex.  You have vaginal bleeding between periods. Summary  Bacterial vaginosis is a vaginal infection that occurs when the normal balance of bacteria in the vagina is disrupted.  Because bacterial vaginosis increases your risk for STIs (sexually transmitted infections), getting treated can help reduce your risk for chlamydia, gonorrhea, herpes, and HIV (human immunodeficiency virus). Treatment is also important for preventing complications in pregnant women, because the condition can cause an early (premature) delivery.  This condition is treated with antibiotic medicines. These may be given as a pill, a vaginal cream, or a medicine that is put into the vagina (suppository). This information is not intended to replace advice given to you by your health care provider. Make sure you discuss any questions you have with your health care  provider. Document Released: 05/08/2005 Document Revised: 01/22/2016 Document Reviewed: 01/22/2016  Risk analyst Patient Education  2017 [employer].  Safe Medications in Pregnancy   Acne: Benzoyl Peroxide Salicylic Acid  Backache/Headache: Tylenol: 2 regular strength every 4 hours OR              2 Extra strength every 6 hours  Colds/Coughs/Allergies: Benadryl (alcohol free) 25 mg every 6 hours as needed Breath right strips Claritin Cepacol throat lozenges Chloraseptic throat spray Cold-Eeze- up to three times per day Cough drops, alcohol free Flonase (by prescription only) Guaifenesin Mucinex Robitussin DM (plain only, alcohol free) Saline nasal spray/drops Sudafed (pseudoephedrine) & Actifed ** use only after [redacted] weeks gestation and if you do not have high blood pressure Tylenol Vicks Vaporub Zinc lozenges Zyrtec   Constipation: Colace Ducolax suppositories Fleet enema Glycerin suppositories Metamucil Milk of magnesia Miralax Senokot Smooth move tea  Diarrhea: Kaopectate Imodium A-D  *NO pepto Bismol  Hemorrhoids: Anusol Anusol HC Preparation H Tucks  Indigestion: Tums Maalox Mylanta Zantac  Pepcid  Insomnia: Benadryl (alcohol free) 25mg  every 6 hours as needed Tylenol PM Unisom, no Gelcaps  Leg Cramps: Tums MagGel  Nausea/Vomiting:  Bonine Dramamine Emetrol Ginger extract Sea bands Meclizine  Nausea medication to take during pregnancy:  Unisom (doxylamine succinate 25 mg tablets) Take one tablet daily at bedtime. If symptoms are not adequately controlled, the dose can be increased to a maximum recommended dose of two tablets daily (1/2 tablet in the morning, 1/2 tablet mid-afternoon and one at bedtime). Vitamin B6 100mg  tablets. Take one tablet twice a day (up to 200 mg per day).  Skin Rashes: Aveeno products Benadryl cream or 25mg  every 6 hours as needed Calamine Lotion 1% cortisone cream  Yeast  infection: Gyne-lotrimin 7 Monistat 7   **If taking multiple medications, please check labels to avoid duplicating the same active ingredients **take medication as directed on the label ** Do not exceed 4000 mg of tylenol in 24 hours **Do not take medications that contain aspirin or ibuprofen

## 2017-04-02 NOTE — MAU Note (Signed)
+  lower abdominal pain Ongoing and intermittent for past week Cramping and sharp in nature Tried tylenol with no relief; last took last night Rating pain 6/10  Denies vaginal bleeding or discharge.

## 2017-04-02 NOTE — MAU Provider Note (Signed)
History    CSN: [id number]  Arrival date and time: 04/02/17 [id number]   First Provider Initiated Contact with Patient 04/02/17 [phone]     Chief Complaint  Patient presents with  . Abdominal Pain   HPI Heather Fox is a 21 y.o. G3P2002 at 7247w1d who presents with lower abdominal pain. She states the pain started one week ago and comes and goes. She rates the pain a 7/10 and tried tylenol with no relief. She denies any vaginal bleeding or abnormal discharge. She plans to get [facility].   OB History    Gravida Para Term Preterm AB Living   3 2 2     2    SAB TAB Ectopic Multiple Live Births         0 2      Past Medical History:  Diagnosis Date  . GERD (gastroesophageal reflux disease)     Past Surgical History:  Procedure Laterality Date  . WISDOM TOOTH EXTRACTION      Family History  Problem Relation Age of Onset  . Stroke Mother   . Cancer Neg Hx   . Diabetes Neg Hx   . Heart disease Neg Hx   . Hypertension Neg Hx   . Hearing loss Neg Hx     Social History   Tobacco Use  . Smoking status: Former Smoker    Packs/day: 0.50    Types: Cigarettes  . Smokeless tobacco: Never Used  . Tobacco comment: quit summer 2018  Substance Use Topics  . Alcohol use: No  . Drug use: No    Comment: stopped when found out about pregnancy     Allergies: No Known Allergies  Medications Prior to Admission  Medication Sig Dispense Refill Last Dose  . doxylamine, Sleep, (UNISOM) 25 MG tablet Take 1 tablet (25 mg total) by mouth at bedtime as needed. 30 tablet 0   . vitamin B-6 (PYRIDOXINE) 25 MG tablet Take 1 tablet (25 mg total) by mouth daily. 30 tablet 0     Review of Systems  Constitutional: Negative.  Negative for fatigue and fever.  HENT: Negative.   Respiratory: Negative.  Negative for shortness of breath.   Cardiovascular: Negative.  Negative for chest pain.  Gastrointestinal: Positive for abdominal pain. Negative for  constipation, diarrhea, nausea and vomiting.  Genitourinary: Negative.  Negative for dysuria, vaginal bleeding and vaginal discharge.  Neurological: Negative.  Negative for dizziness and headaches.   Physical Exam   Blood pressure 108/71, pulse 89, temperature 98.3 F (36.8 C), temperature source Oral, resp. rate 17, weight 135 lb 0.6 oz (61.3 kg), last menstrual period 02/25/2017, SpO2 100 %, unknown if currently breastfeeding.  Physical Exam  Nursing note and vitals reviewed. Constitutional: She is oriented to person, place, and time. She appears well-developed and well-nourished. No distress.  HENT:  Head: Normocephalic.  Eyes: Pupils are equal, round, and reactive to light.  Cardiovascular: Normal rate, regular rhythm and normal heart sounds.  Respiratory: Effort normal and breath sounds normal. No respiratory distress.  GI: Soft. Bowel sounds are normal. She exhibits no distension. There is no tenderness.  Neurological: She is alert and oriented to person, place, and time.  Skin: Skin is warm and dry.  Psychiatric: She has a normal mood and affect. Her behavior is normal. Judgment and thought content normal.   Pelvic exam: Cervix pink, visually closed, without lesion, scant white creamy discharge, vaginal walls and external genitalia normal Bimanual exam: Cervix 0/long/high,  firm, anterior, neg CMT, uterus nontender, adnexa without tenderness, enlargement, or mass  MAU Course  Procedures Results for orders placed or performed during the hospital encounter of 04/02/17 (from the past 24 hour(s))  Wet prep, genital     Status: Abnormal   Collection Time: 04/02/17  8:30 AM  Result Value Ref Range   Yeast Wet Prep HPF POC NONE SEEN NONE SEEN   Trich, Wet Prep NONE SEEN NONE SEEN   Clue Cells Wet Prep HPF POC PRESENT (A) NONE SEEN   WBC, Wet Prep HPF POC MANY (A) NONE SEEN   Sperm NONE SEEN   Urinalysis, Routine w reflex microscopic     Status: Abnormal   Collection Time: 04/02/17   8:35 AM  Result Value Ref Range   Color, Urine YELLOW YELLOW   APPearance HAZY (A) CLEAR   Specific Gravity, Urine 1.009 1.005 - 1.030   pH 7.0 5.0 - 8.0   Glucose, UA NEGATIVE NEGATIVE mg/dL   Hgb urine dipstick NEGATIVE NEGATIVE   Bilirubin Urine NEGATIVE NEGATIVE   Ketones, ur NEGATIVE NEGATIVE mg/dL   Protein, ur NEGATIVE NEGATIVE mg/dL   Nitrite NEGATIVE NEGATIVE   Leukocytes, UA TRACE (A) NEGATIVE   RBC / HPF 0-5 0 - 5 RBC/hpf   WBC, UA 0-5 0 - 5 WBC/hpf   Bacteria, UA RARE (A) NONE SEEN   Squamous Epithelial / LPF 6-30 (A) NONE SEEN   Mucus PRESENT   CBC     Status: Abnormal   Collection Time: 04/02/17  8:54 AM  Result Value Ref Range   WBC 9.4 4.0 - 10.5 K/uL   RBC 3.87 3.87 - 5.11 MIL/uL   Hemoglobin 11.9 (L) 12.0 - 15.0 g/dL   HCT 78.235.3 (L) 95.636.0 - 21.346.0 %   MCV 91.2 78.0 - 100.0 fL   MCH 30.7 26.0 - 34.0 pg   MCHC 33.7 30.0 - 36.0 g/dL   RDW 08.612.5 57.811.5 - 46.915.5 %   Platelets 202 150 - 400 K/uL  ABO/Rh     Status: None   Collection Time: 04/02/17  8:54 AM  Result Value Ref Range   ABO/RH(D) B NEG      MDM UA CBC, HCG, ABO/Rh Wet prep and gc/chlamydia US OB Transvaginal  US OB Comp Less 14 Weeks- IUP at 1138w6d with HR of 171 bpm  Assessment and Plan   1. Abdominal pain affecting pregnancy   2. [redacted] weeks gestation of pregnancy   3. Bacterial vaginosis    --Discharge home in stable condition -Rx for metronidazole sent to patient's pharmacy -Patient advised to follow-up with [facility] as scheduled for prenatal care -Patient may return to MAU as needed or if her condition were to change or worsen  Rolm BookbinderCaroline M Vong Garringer CNM 04/02/2017, 10:12 AM

## 2017-04-03 LAB — GC/CHLAMYDIA PROBE AMP (~~LOC~~) NOT AT ARMC
CHLAMYDIA, DNA PROBE: NEGATIVE
NEISSERIA GONORRHEA: NEGATIVE

## 2017-04-24 ENCOUNTER — Encounter: Payer: Self-pay | Admitting: Certified Nurse Midwife

## 2017-04-24 ENCOUNTER — Ambulatory Visit (INDEPENDENT_AMBULATORY_CARE_PROVIDER_SITE_OTHER): Payer: Medicaid Other | Admitting: Certified Nurse Midwife

## 2017-04-24 ENCOUNTER — Other Ambulatory Visit (HOSPITAL_COMMUNITY)
Admission: RE | Admit: 2017-04-24 | Discharge: 2017-04-24 | Disposition: A | Discharge status: Home / Self Care | Payer: Medicaid Other | Source: Ambulatory Visit | Attending: Certified Nurse Midwife | Admitting: Certified Nurse Midwife

## 2017-04-24 VITALS — BP 104/67 | HR 106 | Wt 134.0 lb

## 2017-04-24 DIAGNOSIS — Z3480 Encounter for supervision of other normal pregnancy, unspecified trimester: Secondary | ICD-10-CM

## 2017-04-24 DIAGNOSIS — Z6791 Unspecified blood type, Rh negative: Secondary | ICD-10-CM

## 2017-04-24 DIAGNOSIS — O09891 Supervision of other high risk pregnancies, first trimester: Secondary | ICD-10-CM

## 2017-04-24 DIAGNOSIS — K219 Gastro-esophageal reflux disease without esophagitis: Secondary | ICD-10-CM

## 2017-04-24 DIAGNOSIS — O99611 Diseases of the digestive system complicating pregnancy, first trimester: Secondary | ICD-10-CM

## 2017-04-24 DIAGNOSIS — O219 Vomiting of pregnancy, unspecified: Secondary | ICD-10-CM

## 2017-04-24 DIAGNOSIS — Z3481 Encounter for supervision of other normal pregnancy, first trimester: Secondary | ICD-10-CM | POA: Insufficient documentation

## 2017-04-24 DIAGNOSIS — O26899 Other specified pregnancy related conditions, unspecified trimester: Secondary | ICD-10-CM

## 2017-04-24 DIAGNOSIS — Z23 Encounter for immunization: Secondary | ICD-10-CM | POA: Diagnosis not present

## 2017-04-24 MED ORDER — DOXYLAMINE-PYRIDOXINE 10-10 MG PO TBEC: DELAYED_RELEASE_TABLET | ORAL | 4 refills | Status: DC

## 2017-04-24 MED ORDER — PRENATE PIXIE 10-0.6-0.4-200 MG PO CAPS: 1.0000 | ORAL_CAPSULE | Freq: Every day | ORAL | 12 refills | Status: DC

## 2017-04-24 NOTE — Progress Notes (Signed)
Subjective:   Heather Fox is a 21 y.o. G3P2002 at 4320w0d by LMP, early ultrasound being seen today for her first obstetrical visit.  Her obstetrical history is significant for none. Patient does not intend to breast feed. Pregnancy history fully reviewed.  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  May need intermittent leave for N&V in early pregnancy.   Patient reports heartburn, nausea, no bleeding, no contractions, no cramping, no leaking and vomiting.  HISTORY: Obstetric History   G3   P2   T2   P0   A0   L2    SAB0   TAB0   Ectopic0   Multiple0   Live Births2     # Outcome Date GA Lbr Len/2nd Weight Sex Delivery Anes PTL Lv  3 Current           2 Term 01/11/15 3431w1d 15:01 / 00:32 6 lb 10.9 oz (3.03 kg) M Vag-Spont EPI  LIV     Name: Wadhwa,BOY Kieara     Apgar1:  9                Apgar5: 9  1 Term 05/21/09    F Vag-Spont  N LIV     Past Medical History:  Diagnosis Date  . GERD (gastroesophageal reflux disease)    Past Surgical History:  Procedure Laterality Date  . WISDOM TOOTH EXTRACTION     Family History  Problem Relation Age of Onset  . Stroke Mother   . Cancer Neg Hx   . Diabetes Neg Hx   . Heart disease Neg Hx   . Hypertension Neg Hx   . Hearing loss Neg Hx    Social History   Tobacco Use  . Smoking status: Former Smoker    Packs/day: 0.50    Types: Cigarettes  . Smokeless tobacco: Never Used  . Tobacco comment: quit summer 2018  Substance Use Topics  . Alcohol use: No  . Drug use: No    Comment: stopped when found out about pregnancy    No Known Allergies Current Outpatient Medications on File Prior to Visit  Medication Sig Dispense Refill  . Prenatal MV & Min w/FA-DHA (PRENATAL ADULT GUMMY/DHA/FA) 0.4-25 MG CHEW Chew by mouth.     No current facility-administered medications on file prior to visit.      Exam   Vitals:   04/24/17 0836  BP: 104/67  Pulse: (!) 106  Weight: 134 lb (60.8 kg)      Uterus:     Pelvic  Exam: Perineum: no hemorrhoids, normal perineum   Vulva: normal external genitalia, no lesions   Vagina:  normal mucosa, normal discharge   Cervix: no lesions and normal, pap smear done.    Adnexa: normal adnexa and no mass, fullness, tenderness   Bony Pelvis: average  System: General: well-developed, well-nourished female in no acute distress   Breast:  normal appearance, no masses or tenderness   Skin: normal coloration and turgor, no rashes   Neurologic: oriented, normal, negative, normal mood   Extremities: normal strength, tone, and muscle mass, ROM of all joints is normal   HEENT PERRLA, extraocular movement intact and sclera clear, anicteric   Mouth/Teeth mucous membranes moist, pharynx normal without lesions and dental hygiene good   Neck supple and no masses   Cardiovascular: regular rate and rhythm   Respiratory:  no respiratory distress, normal breath sounds   Abdomen: soft, non-tender; bowel sounds normal; no masses,  no organomegaly  Assessment:   Pregnancy: [id number] Patient Active Problem List   Diagnosis Date Noted  . Supervision of other normal pregnancy, antepartum 04/24/2017  . GERD (gastroesophageal reflux disease) 12/21/2014  . Rh negative state in antepartum period 11/19/2014     Plan:  1. Supervision of other normal pregnancy, antepartum      - Cytology - PAP - Cervicovaginal ancillary only - Hemoglobinopathy evaluation - Varicella zoster antibody, IgG - Cystic Fibrosis Mutation 97 - Vitamin D (25 hydroxy) - HgB A1c - Obstetric Panel, Including HIV - Culture, OB Urine - MaterniT21 PLUS Core+SCA - Prenat-FeAsp-Meth-FA-DHA w/o A (PRENATE PIXIE) 10-0.6-0.4-200 MG CAPS; Take 1 tablet by mouth daily.  Dispense: 30 capsule; Refill: 12  2. Rh negative state in antepartum period     Rhogam at 28 weeks  3. Gastroesophageal reflux during pregnancy in first trimester, antepartum      OTC TUMS - Ambulatory referral to Gastroenterology  4. Nausea/vomiting  in pregnancy      - Doxylamine-Pyridoxine (DICLEGIS) 10-10 MG TBEC; Take 1 tablet with breakfast and lunch.  Take 2 tablets at bedtime.  Dispense: 100 tablet; Refill: 4   Initial labs drawn. Continue prenatal vitamins. Genetic Screening discussed, NIPS: ordered. Ultrasound discussed; fetal anatomic survey: ordered. Problem list reviewed and updated. The nature [facility] with multiple MDs and other Advanced Practice Providers was explained to patient; also emphasized that residents, students are part of our team. Routine obstetric precautions reviewed. Return in about 4 weeks (around 05/22/2017) for ROB.     Orvilla Cornwallachelle Stephane Junkins, CNM [facility]

## 2017-04-24 NOTE — Progress Notes (Signed)
Patient is in the office for initial ob visit, states that she was previously referred to gastrologist for ongoing issues with GERD, but her insurance would not cover it. Pt reports no complaints today.

## 2017-04-24 NOTE — Patient Instructions (Signed)
Food Choices for Gastroesophageal Reflux Disease, Adult When you have gastroesophageal reflux disease (GERD), the foods you eat and your eating habits are very important. Choosing the right foods can help ease your discomfort. What guidelines do I need to follow?  Choose fruits, vegetables, whole grains, and low-fat dairy products.  Choose low-fat meat, fish, and poultry.  Limit fats such as oils, salad dressings, butter, nuts, and avocado.  Keep a food diary. This helps you identify foods that cause symptoms.  Avoid foods that cause symptoms. These may be different for everyone.  Eat small meals often instead of 3 large meals a day.  Eat your meals slowly, in a place where you are relaxed.  Limit fried foods.  Cook foods using methods other than frying.  Avoid drinking alcohol.  Avoid drinking large amounts of liquids with your meals.  Avoid bending over or lying down until 2-3 hours after eating. What foods are not recommended? These are some foods and drinks that may make your symptoms worse: Vegetables Tomatoes. Tomato juice. Tomato and spaghetti sauce. Chili peppers. Onion and garlic. Horseradish. Fruits Oranges, grapefruit, and lemon (fruit and juice). Meats High-fat meats, fish, and poultry. This includes hot dogs, ribs, ham, sausage, salami, and bacon. Dairy Whole milk and chocolate milk. Sour cream. Cream. Butter. Ice cream. Cream cheese. Drinks Coffee and tea. Bubbly (carbonated) drinks or energy drinks. Condiments Hot sauce. Barbecue sauce. Sweets/Desserts Chocolate and cocoa. Donuts. Peppermint and spearmint. Fats and Oils High-fat foods. This includes [place] fries and potato chips. Other Vinegar. Strong spices. This includes black pepper, white pepper, red pepper, cayenne, curry powder, cloves, ginger, and chili powder. The items listed above may not be a complete list of foods and drinks to avoid. Contact your dietitian for more information. This  information is not intended to replace advice given to you by your health care provider. Make sure you discuss any questions you have with your health care provider. Document Released: 11/07/2011 Document Revised: 10/14/2015 Document Reviewed: 03/12/2013 Elsevier Interactive Patient Education  2017 [employer].  First Trimester of Pregnancy The first trimester of pregnancy is from week 1 until the end of week 13 (months 1 through 3). During this time, your baby will begin to develop inside you. At 6-8 weeks, the eyes and face are formed, and the heartbeat can be seen on ultrasound. At the end of 12 weeks, all the baby's organs are formed. Prenatal care is all the medical care you receive before the birth of your baby. Make sure you get good prenatal care and follow all of your doctor's instructions. Follow these instructions at home: Medicines  Take over-the-counter and prescription medicines only as told by your doctor. Some medicines are safe and some medicines are not safe during pregnancy.  Take a prenatal vitamin that contains at least 600 micrograms (mcg) of folic acid.  If you have trouble pooping (constipation), take medicine that will make your stool soft (stool softener) if your doctor approves. Eating and drinking  Eat regular, healthy meals.  Your doctor will tell you the amount of weight gain that is right for you.  Avoid raw meat and uncooked cheese.  If you feel sick to your stomach (nauseous) or throw up (vomit): ? Eat 4 or 5 small meals a day instead of 3 large meals. ? Try eating a few soda crackers. ? Drink liquids between meals instead of during meals.  To prevent constipation: ? Eat foods that are high in fiber, like fresh fruits and vegetables,  whole grains, and beans. ? Drink enough fluids to keep your pee (urine) clear or pale yellow. Activity  Exercise only as told by your doctor. Stop exercising if you have cramps or pain in your lower belly (abdomen) or  low back.  Do not exercise if it is too hot, too humid, or if you are in a place of great height (high altitude).  Try to avoid standing for long periods of time. Move your legs often if you must stand in one place for a long time.  Avoid heavy lifting.  Wear low-heeled shoes. Sit and stand up straight.  You can have sex unless your doctor tells you not to. Relieving pain and discomfort  Wear a good support bra if your breasts are sore.  Take warm water baths (sitz baths) to soothe pain or discomfort caused by hemorrhoids. Use hemorrhoid cream if your doctor says it is okay.  Rest with your legs raised if you have leg cramps or low back pain.  If you have puffy, bulging veins (varicose veins) in your legs: ? Wear support hose or compression stockings as told by your doctor. ? Raise (elevate) your feet for 15 minutes, 3-4 times a day. ? Limit salt in your food. Prenatal care  Schedule your prenatal visits by the twelfth week of pregnancy.  Write down your questions. Take them to your prenatal visits.  Keep all your prenatal visits as told by your doctor. This is important. Safety  Wear your seat belt at all times when driving.  Make a list of emergency phone numbers. The list should include numbers for family, friends, the hospital, and police and fire departments. General instructions  Ask your doctor for a referral to a local prenatal class. Begin classes no later than at the start of month 6 of your pregnancy.  Ask for help if you need counseling or if you need help with nutrition. Your doctor can give you advice or tell you where to go for help.  Do not use hot tubs, steam rooms, or saunas.  Do not douche or use tampons or scented sanitary pads.  Do not cross your legs for long periods of time.  Avoid all herbs and alcohol. Avoid drugs that are not approved by your doctor.  Do not use any tobacco products, including cigarettes, chewing tobacco, and electronic  cigarettes. If you need help quitting, ask your doctor. You may get counseling or other support to help you quit.  Avoid cat litter boxes and soil used by cats. These carry germs that can cause birth defects in the baby and can cause a loss of your baby (miscarriage) or stillbirth.  Visit your dentist. At home, brush your teeth with a soft toothbrush. Be gentle when you floss. Contact a doctor if:  You are dizzy.  You have mild cramps or pressure in your lower belly.  You have a nagging pain in your belly area.  You continue to feel sick to your stomach, you throw up, or you have watery poop (diarrhea).  You have a bad smelling fluid coming from your vagina.  You have pain when you pee (urinate).  You have increased puffiness (swelling) in your face, hands, legs, or ankles. Get help right away if:  You have a fever.  You are leaking fluid from your vagina.  You have spotting or bleeding from your vagina.  You have very bad belly cramping or pain.  You gain or lose weight rapidly.  You throw up blood.  It may look like coffee grounds.  You are around people who have [place] measles, fifth disease, or chickenpox.  You have a very bad headache.  You have shortness of breath.  You have any kind of trauma, such as from a fall or a car accident. Summary  The first trimester of pregnancy is from week 1 until the end of week 13 (months 1 through 3).  To take care of yourself and your unborn baby, you will need to eat healthy meals, take medicines only if your doctor tells you to do so, and do activities that are safe for you and your baby.  Keep all follow-up visits as told by your doctor. This is important as your doctor will have to ensure that your baby is healthy and growing well. This information is not intended to replace advice given to you by your health care provider. Make sure you discuss any questions you have with your health care provider. Document Released:  10/25/2007 Document Revised: 05/16/2016 Document Reviewed: 05/16/2016 Elsevier Interactive Patient Education  2017 Elsevier Inc.  Heartburn During Pregnancy Heartburn is pain or discomfort in the throat or chest. It may cause a burning feeling. It happens when stomach acid moves up into the tube that carries food from your mouth to your stomach (esophagus). Heartburn is common during pregnancy. It usually goes away or gets better after giving birth. Follow these instructions at home: Eating and drinking  Do not drink alcohol while you are pregnant.  Figure out which foods and beverages make you feel worse, and avoid them.  Beverages that you may want to avoid include: ? Coffee and tea (with or without caffeine). ? Energy drinks and sports drinks. ? Bubbly (carbonated) drinks or sodas. ? Citrus fruit juices.  Foods that you may want to avoid include: ? Chocolate and cocoa. ? Peppermint and mint flavorings. ? Garlic, onions, and horseradish. ? Spicy and acidic foods. These include peppers, chili powder, curry powder, vinegar, hot sauces, and barbecue sauce. ? Citrus fruits, such as oranges, lemons, and limes. ? Tomato-based foods, such as red sauce, chili, and salsa. ? Fried and fatty foods, such as donuts, french fries, potato chips, and high-fat dressings. ? High-fat meats, such as hot dogs, cold cuts, sausage, ham, and bacon. ? High-fat dairy items, such as whole milk, butter, and cheese.  Eat small meals often, instead of large meals.  Avoid drinking a lot of liquid with your meals.  Avoid eating meals during the 2-3 hours before you go to bed.  Avoid lying down right after you eat.  Do not exercise right after you eat. Medicines  Take over-the-counter and prescription medicines only as told by your doctor.  Do not take aspirin, ibuprofen, or other NSAIDs unless your doctor tells you to do that.  Your doctor may tell you to avoid medicines that have sodium bicarbonate  in them. General instructions  If told, raise the head of your bed about 6 inches (15 cm). You can do this by putting blocks under the legs. Sleeping with more pillows does not help with heartburn.  Do not use any products that contain nicotine or tobacco, such as cigarettes and e-cigarettes. If you need help quitting, ask your doctor.  Wear loose-fitting clothing.  Try to lower your stress, such as with yoga or meditation. If you need help, ask your doctor.  Stay at a healthy weight. If you are overweight, work with your doctor to safely lose weight.  Keep all follow-up visits  as told by your doctor. This is important. Contact a doctor if:  You get new symptoms.  Your symptoms do not get better with treatment.  You have weight loss and you do not know why.  You have trouble swallowing.  You make loud sounds when you breathe (wheeze).  You have a cough that does not go away.  You have heartburn often for more than 2 weeks.  You feel sick to your stomach (nauseous), and this does not get better with treatment.  You are throwing up (vomiting), and this does not get better with treatment.  You have pain in your belly (abdomen). Get help right away if:  You have very bad chest pain that spreads to your arm, neck, or jaw.  You feel sweaty, dizzy, or light-headed.  You have trouble breathing.  You have pain when swallowing.  You throw up and your throw-up looks like blood or coffee grounds.  Your poop (stool) is bloody or black. This information is not intended to replace advice given to you by your health care provider. Make sure you discuss any questions you have with your health care provider. Document Released: 06/10/2010 Document Revised: 01/24/2016 Document Reviewed: 01/24/2016 Elsevier Interactive Patient Education  2017 Elsevier Inc.  Morning Sickness Morning sickness is when you feel sick to your stomach (nauseous) during pregnancy. You may feel sick to your  stomach and throw up (vomit). You may feel sick in the morning, but you can feel this way any time of day. Some women feel very sick to their stomach and cannot stop throwing up (hyperemesis gravidarum). Follow these instructions at home:  Only take medicines as told by your doctor.  Take multivitamins as told by your doctor. Taking multivitamins before getting pregnant can stop or lessen the harshness of morning sickness.  Eat dry toast or unsalted crackers before getting out of bed.  Eat 5 to 6 small meals a day.  Eat dry and bland foods like rice and baked potatoes.  Do not drink liquids with meals. Drink between meals.  Do not eat greasy, fatty, or spicy foods.  Have someone cook for you if the smell of food causes you to feel sick or throw up.  If you feel sick to your stomach after taking prenatal vitamins, take them at night or with a snack.  Eat protein when you need a snack (nuts, yogurt, cheese).  Eat unsweetened gelatins for dessert.  Wear a bracelet used for sea sickness (acupressure wristband).  Go to a doctor that puts thin needles into certain body points (acupuncture) to improve how you feel.  Do not smoke.  Use a humidifier to keep the air in your house free of odors.  Get lots of fresh air. Contact a doctor if:  You need medicine to feel better.  You feel dizzy or lightheaded.  You are losing weight. Get help right away if:  You feel very sick to your stomach and cannot stop throwing up.  You pass out (faint). This information is not intended to replace advice given to you by your health care provider. Make sure you discuss any questions you have with your health care provider. Document Released: 06/15/2004 Document Revised: 10/14/2015 Document Reviewed: 10/23/2012 Elsevier Interactive Patient Education  2017 [employer]

## 2017-04-25 ENCOUNTER — Encounter: Payer: Self-pay | Admitting: Physician Assistant

## 2017-04-25 LAB — CERVICOVAGINAL ANCILLARY ONLY
BACTERIAL VAGINITIS: POSITIVE — AB
CANDIDA VAGINITIS: NEGATIVE
CHLAMYDIA, DNA PROBE: NEGATIVE
Neisseria Gonorrhea: NEGATIVE
TRICH (WINDOWPATH): NEGATIVE

## 2017-04-25 LAB — CYTOLOGY - PAP
Diagnosis: NEGATIVE
HPV (WINDOPATH): NOT DETECTED

## 2017-04-26 LAB — CULTURE, OB URINE

## 2017-04-26 LAB — URINE CULTURE, OB REFLEX

## 2017-04-27 ENCOUNTER — Other Ambulatory Visit: Payer: Self-pay | Admitting: Certified Nurse Midwife

## 2017-04-27 DIAGNOSIS — N760 Acute vaginitis: Principal | ICD-10-CM

## 2017-04-27 DIAGNOSIS — E559 Vitamin D deficiency, unspecified: Secondary | ICD-10-CM

## 2017-04-27 DIAGNOSIS — B9689 Other specified bacterial agents as the cause of diseases classified elsewhere: Secondary | ICD-10-CM

## 2017-04-27 DIAGNOSIS — Z3480 Encounter for supervision of other normal pregnancy, unspecified trimester: Secondary | ICD-10-CM

## 2017-04-27 LAB — OBSTETRIC PANEL, INCLUDING HIV
Antibody Screen: NEGATIVE
BASOS ABS: 0 10*3/uL (ref 0.0–0.2)
Basos: 0 %
EOS (ABSOLUTE): 0 10*3/uL (ref 0.0–0.4)
EOS: 0 %
HEMOGLOBIN: 13.1 g/dL (ref 11.1–15.9)
HIV Screen 4th Generation wRfx: NONREACTIVE
Hematocrit: 39.5 % (ref 34.0–46.6)
Hepatitis B Surface Ag: NEGATIVE
IMMATURE GRANS (ABS): 0 10*3/uL (ref 0.0–0.1)
IMMATURE GRANULOCYTES: 0 %
LYMPHS ABS: 2 10*3/uL (ref 0.7–3.1)
LYMPHS: 22 %
MCH: 30.8 pg (ref 26.6–33.0)
MCHC: 33.2 g/dL (ref 31.5–35.7)
MCV: 93 fL (ref 79–97)
MONOS ABS: 0.4 10*3/uL (ref 0.1–0.9)
Monocytes: 4 %
NEUTROS PCT: 74 %
Neutrophils Absolute: 6.6 10*3/uL (ref 1.4–7.0)
PLATELETS: 225 10*3/uL (ref 150–379)
RBC: 4.26 x10E6/uL (ref 3.77–5.28)
RDW: 13 % (ref 12.3–15.4)
RPR Ser Ql: NONREACTIVE
Rh Factor: NEGATIVE
Rubella Antibodies, IGG: 4.73 index (ref >0.99)
WBC: 9 10*3/uL (ref 3.4–10.8)

## 2017-04-27 LAB — HEMOGLOBINOPATHY EVALUATION
HGB A: 97.7 % (ref 96.4–98.8)
HGB C: 0 %
HGB S: 0 %
HGB VARIANT: 0 %
Hemoglobin A2 Quantitation: 2.3 % (ref 1.8–3.2)
Hemoglobin F Quantitation: 0 % (ref 0.0–2.0)

## 2017-04-27 LAB — VARICELLA ZOSTER ANTIBODY, IGG: VARICELLA: 933 {index} (ref >165)

## 2017-04-27 LAB — HEMOGLOBIN A1C
Est. average glucose Bld gHb Est-mCnc: 100 mg/dL
HEMOGLOBIN A1C: 5.1 % (ref 4.8–5.6)

## 2017-04-27 LAB — VITAMIN D 25 HYDROXY (VIT D DEFICIENCY, FRACTURES): VIT D 25 HYDROXY: 14.7 ng/mL — AB (ref 30.0–100.0)

## 2017-04-27 MED ORDER — VITAMIN D (ERGOCALCIFEROL) 1.25 MG (50000 UNIT) PO CAPS
50000.0000 [IU] | ORAL_CAPSULE | ORAL | 2 refills | Status: DC
Start: 2017-04-27 — End: 2018-01-05

## 2017-04-27 MED ORDER — METRONIDAZOLE 0.75 % VA GEL: 1.0000 | Freq: Two times a day (BID) | VAGINAL | 0 refills | Status: DC

## 2017-04-29 LAB — MATERNIT21 PLUS CORE+SCA
Chromosome 13: NEGATIVE
Chromosome 18: NEGATIVE
Chromosome 21: NEGATIVE
Y Chromosome: DETECTED

## 2017-05-01 ENCOUNTER — Other Ambulatory Visit: Payer: Self-pay | Admitting: Certified Nurse Midwife

## 2017-05-01 DIAGNOSIS — Z3480 Encounter for supervision of other normal pregnancy, unspecified trimester: Secondary | ICD-10-CM

## 2017-05-02 ENCOUNTER — Other Ambulatory Visit: Payer: Self-pay | Admitting: Certified Nurse Midwife

## 2017-05-02 DIAGNOSIS — Z3480 Encounter for supervision of other normal pregnancy, unspecified trimester: Secondary | ICD-10-CM

## 2017-05-02 LAB — CYSTIC FIBROSIS MUTATION 97: GENE DIS ANAL CARRIER INTERP BLD/T-IMP: NOT DETECTED

## 2017-05-04 ENCOUNTER — Ambulatory Visit: Payer: Medicaid Other | Admitting: Physician Assistant

## 2017-05-08 ENCOUNTER — Encounter: Payer: Medicaid Other | Admitting: Advanced Practice Midwife

## 2017-05-08 ENCOUNTER — Encounter: Payer: Self-pay | Admitting: Certified Nurse Midwife

## 2017-05-20 ENCOUNTER — Other Ambulatory Visit: Payer: Self-pay

## 2017-05-20 ENCOUNTER — Encounter (HOSPITAL_COMMUNITY): Payer: Self-pay

## 2017-05-20 ENCOUNTER — Inpatient Hospital Stay (HOSPITAL_COMMUNITY)
Admission: AD | Admit: 2017-05-20 | Discharge: 2017-05-20 | Discharge status: Against Medical Advice | Payer: Medicaid Other | Source: Ambulatory Visit | Attending: Obstetrics and Gynecology | Admitting: Obstetrics and Gynecology

## 2017-05-20 DIAGNOSIS — R109 Unspecified abdominal pain: Secondary | ICD-10-CM | POA: Diagnosis present

## 2017-05-20 DIAGNOSIS — O26892 Other specified pregnancy related conditions, second trimester: Secondary | ICD-10-CM | POA: Insufficient documentation

## 2017-05-20 DIAGNOSIS — Z5321 Procedure and treatment not carried out due to patient leaving prior to being seen by health care provider: Secondary | ICD-10-CM | POA: Diagnosis not present

## 2017-05-20 MED ORDER — KETOROLAC TROMETHAMINE 60 MG/2ML IM SOLN: 60.0000 mg | Freq: Once | INTRAMUSCULAR | Status: DC

## 2017-05-20 NOTE — Progress Notes (Signed)
After taking pt's history/vitals earlier , I let pt know that the provider would see her and that possibly they would order blood work and ultrasound. At this time, pt was seen leaving her room with her clothes on, walked by nurse's station to MAU lobby, and did not tell anyone she was leaving MAU.  Notified AlabamaVirginia Smith CNM. Looked for pt in lobby and she was not there.

## 2017-05-20 NOTE — Progress Notes (Signed)
Pt did not return to MAU, left AMA.

## 2017-05-20 NOTE — MAU Note (Signed)
On Friday night, had pressure and passed a sac looking tissue in toilet.  Had to wait to her family got back in town because has a 23 year old. Cramping real bad since then and having headaches.  Vaginal bleeding yesterday heavy period but today was less. Just wanted to make sure I'm ok now.

## 2017-05-23 ENCOUNTER — Encounter: Payer: Medicaid Other | Admitting: Nurse Practitioner

## 2017-06-05 ENCOUNTER — Encounter (HOSPITAL_COMMUNITY): Payer: Self-pay | Admitting: Certified Nurse Midwife

## 2017-06-12 ENCOUNTER — Ambulatory Visit (HOSPITAL_COMMUNITY): Payer: Medicaid Other | Attending: Certified Nurse Midwife

## 2017-12-28 ENCOUNTER — Encounter (HOSPITAL_COMMUNITY): Payer: Self-pay | Admitting: *Deleted

## 2017-12-28 ENCOUNTER — Inpatient Hospital Stay (HOSPITAL_COMMUNITY)
Admission: AD | Admit: 2017-12-28 | Discharge: 2017-12-28 | Discharge status: Against Medical Advice | Payer: Medicaid Other | Source: Ambulatory Visit | Attending: Obstetrics & Gynecology | Admitting: Obstetrics & Gynecology

## 2017-12-28 DIAGNOSIS — Z5321 Procedure and treatment not carried out due to patient leaving prior to being seen by health care provider: Secondary | ICD-10-CM | POA: Insufficient documentation

## 2017-12-28 LAB — URINALYSIS, ROUTINE W REFLEX MICROSCOPIC
BILIRUBIN URINE: NEGATIVE
GLUCOSE, UA: NEGATIVE mg/dL
Hgb urine dipstick: NEGATIVE
KETONES UR: 5 mg/dL — AB
Nitrite: NEGATIVE
PH: 5 (ref 5.0–8.0)
Protein, ur: NEGATIVE mg/dL
SPECIFIC GRAVITY, URINE: 1.028 (ref 1.005–1.030)

## 2017-12-28 LAB — POCT PREGNANCY, URINE: Preg Test, Ur: NEGATIVE

## 2017-12-28 NOTE — MAU Note (Signed)
Pt called, not in lobby 

## 2017-12-28 NOTE — MAU Note (Signed)
Pt C/O abdominal pain anytime she eats, has hx of gastric issues.  Has nausea, no vomiting or diarrhea.  Having issues with constipation.  Denies bleeding.

## 2017-12-28 NOTE — MAU Note (Signed)
Third call, pt not in lobby, did not inform staff she was leaving.

## 2017-12-28 NOTE — MAU Note (Signed)
First call - pt not in lobby.

## 2018-01-04 ENCOUNTER — Encounter (HOSPITAL_COMMUNITY): Payer: Self-pay | Admitting: *Deleted

## 2018-01-04 ENCOUNTER — Inpatient Hospital Stay (HOSPITAL_COMMUNITY)
Admission: AD | Admit: 2018-01-04 | Discharge: 2018-01-05 | Disposition: A | Discharge status: Home / Self Care | Payer: Medicaid Other | Source: Ambulatory Visit | Attending: Obstetrics and Gynecology | Admitting: Obstetrics and Gynecology

## 2018-01-04 DIAGNOSIS — Z3202 Encounter for pregnancy test, result negative: Secondary | ICD-10-CM | POA: Insufficient documentation

## 2018-01-04 DIAGNOSIS — B9689 Other specified bacterial agents as the cause of diseases classified elsewhere: Secondary | ICD-10-CM

## 2018-01-04 DIAGNOSIS — Z87891 Personal history of nicotine dependence: Secondary | ICD-10-CM | POA: Insufficient documentation

## 2018-01-04 DIAGNOSIS — N760 Acute vaginitis: Secondary | ICD-10-CM | POA: Insufficient documentation

## 2018-01-04 DIAGNOSIS — R109 Unspecified abdominal pain: Secondary | ICD-10-CM | POA: Insufficient documentation

## 2018-01-04 DIAGNOSIS — R1084 Generalized abdominal pain: Secondary | ICD-10-CM

## 2018-01-04 LAB — URINALYSIS, ROUTINE W REFLEX MICROSCOPIC
BACTERIA UA: NONE SEEN
BILIRUBIN URINE: NEGATIVE
Glucose, UA: NEGATIVE mg/dL
HGB URINE DIPSTICK: NEGATIVE
KETONES UR: 5 mg/dL — AB
NITRITE: NEGATIVE
PROTEIN: NEGATIVE mg/dL
Specific Gravity, Urine: 1.03 (ref 1.005–1.030)
pH: 5 (ref 5.0–8.0)

## 2018-01-04 LAB — POCT PREGNANCY, URINE: PREG TEST UR: NEGATIVE

## 2018-01-04 NOTE — MAU Note (Addendum)
Having pain in lower stomach for 2 wks and getting worse. Denies vag bleeding or d/c. Denies n/v/d. LMP 11/26/17. Pain is crampy and constant. Hx irreg periods and bleeding

## 2018-01-05 ENCOUNTER — Encounter (HOSPITAL_COMMUNITY): Payer: Self-pay | Admitting: *Deleted

## 2018-01-05 DIAGNOSIS — B9689 Other specified bacterial agents as the cause of diseases classified elsewhere: Secondary | ICD-10-CM

## 2018-01-05 DIAGNOSIS — N760 Acute vaginitis: Secondary | ICD-10-CM

## 2018-01-05 LAB — CBC
HCT: 36.3 % (ref 36.0–46.0)
Hemoglobin: 12.4 g/dL (ref 12.0–15.0)
MCH: 31.6 pg (ref 26.0–34.0)
MCHC: 34.2 g/dL (ref 30.0–36.0)
MCV: 92.4 fL (ref 78.0–100.0)
Platelets: 192 10*3/uL (ref 150–400)
RBC: 3.93 MIL/uL (ref 3.87–5.11)
RDW: 13.2 % (ref 11.5–15.5)
WBC: 8.5 10*3/uL (ref 4.0–10.5)

## 2018-01-05 LAB — WET PREP, GENITAL
SPERM: NONE SEEN
Trich, Wet Prep: NONE SEEN
YEAST WET PREP: NONE SEEN

## 2018-01-05 MED ORDER — KETOROLAC TROMETHAMINE 60 MG/2ML IM SOLN
60.0000 mg | Freq: Once | INTRAMUSCULAR | Status: AC
  Administered 2018-01-05: 60 mg via INTRAMUSCULAR
  Filled 2018-01-05: qty 2

## 2018-01-05 MED ORDER — METRONIDAZOLE 500 MG PO TABS: 500.0000 mg | ORAL_TABLET | Freq: Two times a day (BID) | ORAL | 0 refills | Status: DC

## 2018-01-05 NOTE — MAU Provider Note (Signed)
History     CSN: [id number]  Arrival date and time: 01/04/18 2309   First Provider Initiated Contact with Patient 01/05/18 0021      Chief Complaint  Patient presents with  . Abdominal Pain   HPI Heather Fox is a 22 y.o. [id number] non pregnant female who presents with abdominal pain. This has been ongoing for months and the patient has not been to see her primary care doctor due to her work schedule. She states the pain is unchanged but she was concerned. She denies any associated symptoms. She rates the pain a 6/10 and has not tried anything for the pain. She denies vaginal bleeding or discharge.   OB History    Gravida  3   Para  2   Term  2   Preterm      AB      Living  2     SAB      TAB      Ectopic      Multiple  0   Live Births  2           Past Medical History:  Diagnosis Date  . GERD (gastroesophageal reflux disease)     Past Surgical History:  Procedure Laterality Date  . WISDOM TOOTH EXTRACTION      Family History  Problem Relation Age of Onset  . Stroke Mother   . Cancer Neg Hx   . Diabetes Neg Hx   . Heart disease Neg Hx   . Hypertension Neg Hx   . Hearing loss Neg Hx     Social History   Tobacco Use  . Smoking status: Former Smoker    Packs/day: 0.50    Types: Cigarettes  . Smokeless tobacco: Never Used  . Tobacco comment: quit summer 2018  Substance Use Topics  . Alcohol use: No  . Drug use: No    Types: Marijuana    Comment: stopped when found out about pregnancy     Allergies: No Known Allergies  Medications Prior to Admission  Medication Sig Dispense Refill Last Dose  . acetaminophen (TYLENOL) 325 MG tablet Take 650 mg by mouth every 6 (six) hours as needed.   01/04/2018 at Unknown time  . ibuprofen (ADVIL,MOTRIN) 200 MG tablet Take 200 mg by mouth every 6 (six) hours as needed.   01/04/2018 at Unknown time  . Doxylamine-Pyridoxine (DICLEGIS) 10-10 MG TBEC Take 1 tablet with breakfast and lunch.  Take 2  tablets at bedtime. 100 tablet 4   . metroNIDAZOLE (METROGEL VAGINAL) 0.75 % vaginal gel Place 1 Applicatorful vaginally 2 (two) times daily. 70 g 0   . Prenat-FeAsp-Meth-FA-DHA w/o A (PRENATE PIXIE) 10-0.6-0.4-200 MG CAPS Take 1 tablet by mouth daily. 30 capsule 12   . Prenatal MV & Min w/FA-DHA (PRENATAL ADULT GUMMY/DHA/FA) 0.4-25 MG CHEW Chew by mouth.   05/19/2017 at Unknown time  . Vitamin D, Ergocalciferol, (DRISDOL) 50000 units CAPS capsule Take 1 capsule (50,000 Units total) by mouth every 7 (seven) days. 30 capsule 2     Review of Systems  Constitutional: Negative.  Negative for fatigue and fever.  HENT: Negative.   Respiratory: Negative.  Negative for shortness of breath.   Cardiovascular: Negative.  Negative for chest pain.  Gastrointestinal: Positive for abdominal pain. Negative for constipation, diarrhea, nausea and vomiting.  Genitourinary: Negative.  Negative for dysuria, vaginal bleeding and vaginal discharge.  Neurological: Negative.  Negative for dizziness and headaches.   Physical Exam   Blood pressure Marland Kitchen(!)  96/57, pulse 87, temperature 98.3 F (36.8 C), resp. rate 18, height 5\' 4"  (1.626 m), weight 58.5 kg, last menstrual period 11/26/2017, unknown if currently breastfeeding.  Physical Exam  Nursing note and vitals reviewed. Constitutional: She is oriented to person, place, and time. She appears well-developed and well-nourished. No distress.  HENT:  Head: Normocephalic.  Eyes: Pupils are equal, round, and reactive to light.  Cardiovascular: Normal rate, regular rhythm and normal heart sounds.  Respiratory: Effort normal and breath sounds normal. No respiratory distress.  GI: Soft. Bowel sounds are normal. She exhibits no distension and no mass. There is no tenderness. There is no rebound and no guarding.  Neurological: She is alert and oriented to person, place, and time.  Skin: Skin is warm and dry.  Psychiatric: She has a normal mood and affect. Her behavior is  normal. Judgment and thought content normal.    MAU Course  Procedures Results for orders placed or performed during the hospital encounter of 01/04/18 (from the past 24 hour(s))  Urinalysis, Routine w reflex microscopic     Status: Abnormal   Collection Time: 01/04/18 11:31 PM  Result Value Ref Range   Color, Urine YELLOW YELLOW   APPearance CLOUDY (A) CLEAR   Specific Gravity, Urine 1.030 1.005 - 1.030   pH 5.0 5.0 - 8.0   Glucose, UA NEGATIVE NEGATIVE mg/dL   Hgb urine dipstick NEGATIVE NEGATIVE   Bilirubin Urine NEGATIVE NEGATIVE   Ketones, ur 5 (A) NEGATIVE mg/dL   Protein, ur NEGATIVE NEGATIVE mg/dL   Nitrite NEGATIVE NEGATIVE   Leukocytes, UA TRACE (A) NEGATIVE   RBC / HPF 0-5 0 - 5 RBC/hpf   WBC, UA 0-5 0 - 5 WBC/hpf   Bacteria, UA NONE SEEN NONE SEEN   Squamous Epithelial / LPF 11-20 0 - 5   Mucus PRESENT   Pregnancy, urine POC     Status: None   Collection Time: 01/04/18 11:36 PM  Result Value Ref Range   Preg Test, Ur NEGATIVE NEGATIVE  CBC     Status: None   Collection Time: 01/05/18 12:17 AM  Result Value Ref Range   WBC 8.5 4.0 - 10.5 K/uL   RBC 3.93 3.87 - 5.11 MIL/uL   Hemoglobin 12.4 12.0 - 15.0 g/dL   HCT 47.436.3 25.936.0 - 56.346.0 %   MCV 92.4 78.0 - 100.0 fL   MCH 31.6 26.0 - 34.0 pg   MCHC 34.2 30.0 - 36.0 g/dL   RDW 87.513.2 64.311.5 - 32.915.5 %   Platelets 192 150 - 400 K/uL  Wet prep, genital     Status: Abnormal   Collection Time: 01/05/18 12:28 AM  Result Value Ref Range   Yeast Wet Prep HPF POC NONE SEEN NONE SEEN   Trich, Wet Prep NONE SEEN NONE SEEN   Clue Cells Wet Prep HPF POC PRESENT (A) NONE SEEN   WBC, Wet Prep HPF POC MODERATE (A) NONE SEEN   Sperm NONE SEEN    MDM UA, UPT Wet prep and gc/chlamydia CBC Toradol IM  Assessment and Plan   1. Bacterial vaginosis   2. Generalized abdominal pain    -Discharge home in stable condition -Rx for flagyl given to patient -Abdominal pain precautions discussed -Patient advised to follow-up with PCP of  choice to manage chronic abdominal pain -Patient may return to MAU as needed or if her condition were to change or worsen  Rolm BookbinderCaroline M Gracin Mcpartland CNM 01/05/2018, 12:27 AM

## 2018-01-05 NOTE — Discharge Instructions (Signed)
Primary care follow up  °[facility] (will see you even if you do not have sickle cell): [phone] °[facility]: [phone] °[facility]: [phone] ° °Bacterial Vaginosis °Bacterial vaginosis is a vaginal infection that occurs when the normal balance of bacteria in the vagina is disrupted. It results from an overgrowth of certain bacteria. This is the most common vaginal infection among women ages 15-44. °Because bacterial vaginosis increases your risk for STIs (sexually transmitted infections), getting treated can help reduce your risk for chlamydia, gonorrhea, herpes, and HIV (human immunodeficiency virus). Treatment is also important for preventing complications in pregnant women, because this condition can cause an early (premature) delivery. °What are the causes? °This condition is caused by an increase in harmful bacteria that are normally present in small amounts in the vagina. However, the reason that the condition develops is not fully understood. °What increases the risk? °The following factors may make you more likely to develop this condition: °· Having a new sexual partner or multiple sexual partners. °· Having unprotected sex. °· Douching. °· Having an intrauterine device (IUD). °· Smoking. °· Drug and alcohol abuse. °· Taking certain antibiotic medicines. °· Being pregnant. ° °You cannot get bacterial vaginosis from toilet seats, bedding, swimming pools, or contact with objects around you. °What are the signs or symptoms? °Symptoms of this condition include: °· Grey or white vaginal discharge. The discharge can also be watery or foamy. °· A fish-like odor with discharge, especially after sexual intercourse or during menstruation. °· Itching in and around the vagina. °· Burning or pain with urination. ° °Some women with bacterial vaginosis have no signs or symptoms. °How is this diagnosed? °This condition is diagnosed based on: °· Your medical  history. °· A physical exam of the vagina. °· Testing a sample of vaginal fluid under a microscope to look for a large amount of bad bacteria or abnormal cells. Your health care provider may use a cotton swab or a small wooden spatula to collect the sample. ° °How is this treated? °This condition is treated with antibiotics. These may be given as a pill, a vaginal cream, or a medicine that is put into the vagina (suppository). If the condition comes back after treatment, a second round of antibiotics may be needed. °Follow these instructions at home: °Medicines °· Take over-the-counter and prescription medicines only as told by your health care provider. °· Take or use your antibiotic as told by your health care provider. Do not stop taking or using the antibiotic even if you start to feel better. °General instructions °· If you have a female sexual partner, tell her that you have a vaginal infection. She should see her health care provider and be treated if she has symptoms. If you have a female sexual partner, he does not need treatment. °· During treatment: °? Avoid sexual activity until you finish treatment. °? Do not douche. °? Avoid alcohol as directed by your health care provider. °? Avoid breastfeeding as directed by your health care provider. °· Drink enough water and fluids to keep your urine clear or pale yellow. °· Keep the area around your vagina and rectum clean. °? Wash the area daily with warm water. °? Wipe yourself from front to back after using the toilet. °· Keep all follow-up visits as told by your health care provider. This is important. °How is this prevented? °· Do not douche. °· Wash the outside of your vagina with warm water only. °· Use protection   when having sex. This includes latex condoms and dental dams. °· Limit how many sexual partners you have. To help prevent bacterial vaginosis, it is best to have sex with just one partner (monogamous). °· Make sure you and your sexual partner are  tested for STIs. °· Wear cotton or cotton-lined underwear. °· Avoid wearing tight pants and pantyhose, especially during summer. °· Limit the amount of alcohol that you drink. °· Do not use any products that contain nicotine or tobacco, such as cigarettes and e-cigarettes. If you need help quitting, ask your health care provider. °· Do not use illegal drugs. °Where to find more information: °· Centers for Disease Control and Prevention: www.cdc.gov/std °· American Sexual Health Association (ASHA): www.ashastd.org °· U.S. Department of Health and Human Services, Office on Women's Health: www.womenshealth.gov/ or https://www.womenshealth.gov/a-z-topics/bacterial-vaginosis °Contact a health care provider if: °· Your symptoms do not improve, even after treatment. °· You have more discharge or pain when urinating. °· You have a fever. °· You have pain in your abdomen. °· You have pain during sex. °· You have vaginal bleeding between periods. °Summary °· Bacterial vaginosis is a vaginal infection that occurs when the normal balance of bacteria in the vagina is disrupted. °· Because bacterial vaginosis increases your risk for STIs (sexually transmitted infections), getting treated can help reduce your risk for chlamydia, gonorrhea, herpes, and HIV (human immunodeficiency virus). Treatment is also important for preventing complications in pregnant women, because the condition can cause an early (premature) delivery. °· This condition is treated with antibiotic medicines. These may be given as a pill, a vaginal cream, or a medicine that is put into the vagina (suppository). °This information is not intended to replace advice given to you by your health care provider. Make sure you discuss any questions you have with your health care provider. °Document Released: 05/08/2005 Document Revised: 09/11/2016 Document Reviewed: 01/22/2016 °Elsevier Interactive Patient Education © 2018 [employer] Inc. ° °

## 2018-01-05 NOTE — Progress Notes (Signed)
Caroline Neill CNM in earlier to discuss test results and d/c plan. Written and verbal d/c instructions given and understanding voiced 

## 2018-01-07 LAB — GC/CHLAMYDIA PROBE AMP (~~LOC~~) NOT AT ARMC
CHLAMYDIA, DNA PROBE: NEGATIVE
NEISSERIA GONORRHEA: NEGATIVE

## 2018-05-07 ENCOUNTER — Encounter: Payer: Self-pay | Admitting: *Deleted

## 2018-05-07 ENCOUNTER — Ambulatory Visit (INDEPENDENT_AMBULATORY_CARE_PROVIDER_SITE_OTHER): Payer: Medicaid Other

## 2018-05-07 VITALS — BP 116/65 | HR 96 | Wt 135.8 lb

## 2018-05-07 DIAGNOSIS — N760 Acute vaginitis: Principal | ICD-10-CM

## 2018-05-07 DIAGNOSIS — Z3201 Encounter for pregnancy test, result positive: Secondary | ICD-10-CM | POA: Diagnosis not present

## 2018-05-07 DIAGNOSIS — B9689 Other specified bacterial agents as the cause of diseases classified elsewhere: Secondary | ICD-10-CM

## 2018-05-07 DIAGNOSIS — Z3200 Encounter for pregnancy test, result unknown: Secondary | ICD-10-CM

## 2018-05-07 LAB — POCT URINE PREGNANCY: Preg Test, Ur: POSITIVE — AB

## 2018-05-07 NOTE — Progress Notes (Signed)
Pt is here for pregnancy test. Pts last LMP 03/16/18. Pt states she is fairly sure about her LMP. EDD: 12/21/18. Pt instructed to return week of 05/27/18 when she is about [redacted] weeks gestation, pt verbalized understanding. Pt states she already has prenatal vitamins and declined samples, safe OTC med list given.

## 2018-05-17 ENCOUNTER — Encounter (HOSPITAL_COMMUNITY): Payer: Self-pay | Admitting: *Deleted

## 2018-05-17 ENCOUNTER — Inpatient Hospital Stay (HOSPITAL_COMMUNITY): Payer: Medicaid Other

## 2018-05-17 ENCOUNTER — Inpatient Hospital Stay (HOSPITAL_COMMUNITY)
Admission: AD | Admit: 2018-05-17 | Discharge: 2018-05-17 | Disposition: A | Discharge status: Home / Self Care | Payer: Medicaid Other | Attending: Obstetrics & Gynecology | Admitting: Obstetrics & Gynecology

## 2018-05-17 DIAGNOSIS — R109 Unspecified abdominal pain: Secondary | ICD-10-CM

## 2018-05-17 DIAGNOSIS — O26891 Other specified pregnancy related conditions, first trimester: Secondary | ICD-10-CM

## 2018-05-17 DIAGNOSIS — O045 Genital tract and pelvic infection following (induced) termination of pregnancy: Secondary | ICD-10-CM | POA: Insufficient documentation

## 2018-05-17 DIAGNOSIS — Z87891 Personal history of nicotine dependence: Secondary | ICD-10-CM | POA: Insufficient documentation

## 2018-05-17 DIAGNOSIS — O035 Genital tract and pelvic infection following complete or unspecified spontaneous abortion: Secondary | ICD-10-CM

## 2018-05-17 LAB — URINALYSIS, ROUTINE W REFLEX MICROSCOPIC
BACTERIA UA: NONE SEEN
Bilirubin Urine: NEGATIVE
Glucose, UA: NEGATIVE mg/dL
Ketones, ur: NEGATIVE mg/dL
NITRITE: NEGATIVE
Protein, ur: 30 mg/dL — AB
Specific Gravity, Urine: 1.023 (ref 1.005–1.030)
WBC, UA: >50 WBC/hpf — ABNORMAL HIGH (ref 0–5)
pH: 6 (ref 5.0–8.0)

## 2018-05-17 LAB — CBC WITH DIFFERENTIAL/PLATELET
Basophils Absolute: 0 10*3/uL (ref 0.0–0.1)
Basophils Relative: 0 %
Eosinophils Absolute: 0 10*3/uL (ref 0.0–0.5)
Eosinophils Relative: 0 %
HEMATOCRIT: 31.1 % — AB (ref 36.0–46.0)
HEMOGLOBIN: 10.4 g/dL — AB (ref 12.0–15.0)
Lymphocytes Relative: 12 %
Lymphs Abs: 1.9 10*3/uL (ref 0.7–4.0)
MCH: 31.3 pg (ref 26.0–34.0)
MCHC: 33.4 g/dL (ref 30.0–36.0)
MCV: 93.7 fL (ref 80.0–100.0)
MONOS PCT: 5 %
Monocytes Absolute: 0.8 10*3/uL (ref 0.1–1.0)
NEUTROS ABS: 13.9 10*3/uL — AB (ref 1.7–7.7)
NEUTROS PCT: 83 %
Platelets: 202 10*3/uL (ref 150–400)
RBC: 3.32 MIL/uL — AB (ref 3.87–5.11)
RDW: 12.8 % (ref 11.5–15.5)
WBC: 16.7 10*3/uL — ABNORMAL HIGH (ref 4.0–10.5)
nRBC: 0 % (ref 0.0–0.2)

## 2018-05-17 LAB — WET PREP, GENITAL
SPERM: NONE SEEN
TRICH WET PREP: NONE SEEN
Yeast Wet Prep HPF POC: NONE SEEN

## 2018-05-17 LAB — HCG, QUANTITATIVE, PREGNANCY: hCG, Beta Chain, Quant, S: 8317 m[IU]/mL — ABNORMAL HIGH (ref <5)

## 2018-05-17 MED ORDER — PANTOPRAZOLE SODIUM 40 MG PO TBEC
40.0000 mg | DELAYED_RELEASE_TABLET | Freq: Once | ORAL | Status: AC
  Administered 2018-05-17: 40 mg via ORAL
  Filled 2018-05-17: qty 1

## 2018-05-17 MED ORDER — TRAMADOL HCL 50 MG PO TABS: 50.0000 mg | ORAL_TABLET | Freq: Four times a day (QID) | ORAL | 0 refills | Status: DC | PRN

## 2018-05-17 MED ORDER — AMOXICILLIN-POT CLAVULANATE 875-125 MG PO TABS: 1.0000 | ORAL_TABLET | Freq: Two times a day (BID) | ORAL | 0 refills | Status: AC

## 2018-05-17 MED ORDER — HYDROMORPHONE HCL 1 MG/ML IJ SOLN
1.0000 mg | Freq: Once | INTRAMUSCULAR | Status: AC
  Administered 2018-05-17: 1 mg via INTRAMUSCULAR
  Filled 2018-05-17: qty 1

## 2018-05-17 MED ORDER — TRAMADOL HCL 50 MG PO TABS
50.0000 mg | ORAL_TABLET | Freq: Once | ORAL | Status: AC
  Administered 2018-05-17: 50 mg via ORAL
  Filled 2018-05-17: qty 1

## 2018-05-17 MED ORDER — AMOXICILLIN-POT CLAVULANATE 875-125 MG PO TABS
1.0000 | ORAL_TABLET | Freq: Once | ORAL | Status: AC
  Administered 2018-05-17: 1 via ORAL
  Filled 2018-05-17: qty 1

## 2018-05-17 MED ORDER — RHO D IMMUNE GLOBULIN 1500 UNIT/2ML IJ SOSY
300.0000 ug | PREFILLED_SYRINGE | Freq: Once | INTRAMUSCULAR | Status: AC
  Administered 2018-05-17: 300 ug via INTRAMUSCULAR
  Filled 2018-05-17: qty 2

## 2018-05-17 NOTE — MAU Provider Note (Signed)
Chief Complaint: Abdominal Pain   First Provider Initiated Contact with Patient 05/17/18 1542      SUBJECTIVE HPI: Heather Fox is a 22 y.o. [id number] who is s/p medical abortion with [facility] on 12/24 and 12/25 who presents to maternity admissions reporting chills and abdominal pain.  She also reports throat pain since waking up this morning. Her abdominal pain is cramping pain low in her abdomen. She has scant bleeding which is much less than during the 2 days of medications for her termination but the pain is worsening. She is taking naproxen 500 mg x 2 tabs twice per day.  She has hx of GERD but has not seen GI due to insurance.  There are no other symptoms. She has not tried other treatments.      HPI  Past Medical History:  Diagnosis Date  . GERD (gastroesophageal reflux disease)    Past Surgical History:  Procedure Laterality Date  . NO PAST SURGERIES    . WISDOM TOOTH EXTRACTION     Social History   Socioeconomic History  . Marital status: Single    Spouse name: Not on file  . Number of children: Not on file  . Years of education: Not on file  . Highest education level: Not on file  Occupational History  . Not on file  Social Needs  . Financial resource strain: Not on file  . Food insecurity:    Worry: Not on file    Inability: Not on file  . Transportation needs:    Medical: Not on file    Non-medical: Not on file  Tobacco Use  . Smoking status: Former Smoker    Packs/day: 0.50    Types: Cigarettes  . Smokeless tobacco: Never Used  . Tobacco comment: quit summer 2018  Substance and Sexual Activity  . Alcohol use: No  . Drug use: Yes    Types: Marijuana    Comment: last was last wk  . Sexual activity: Yes    Birth control/protection: None  Lifestyle  . Physical activity:    Days per week: Not on file    Minutes per session: Not on file  . Stress: Not on file  Relationships  . Social connections:    Talks on phone: Not on file    Gets  together: Not on file    Attends religious service: Not on file    Active member of club or organization: Not on file    Attends meetings of clubs or organizations: Not on file    Relationship status: Not on file  . Intimate partner violence:    Fear of current or ex partner: Not on file    Emotionally abused: Not on file    Physically abused: Not on file    Forced sexual activity: Not on file  Other Topics Concern  . Not on file  Social History Narrative  . Not on file   No current facility-administered medications on file prior to encounter.    Current Outpatient Medications on File Prior to Encounter  Medication Sig Dispense Refill  . acetaminophen (TYLENOL) 325 MG tablet Take 650 mg by mouth every 6 (six) hours as needed.    Marland Kitchen ibuprofen (ADVIL,MOTRIN) 200 MG tablet Take 200 mg by mouth every 6 (six) hours as needed.    . metroNIDAZOLE (FLAGYL) 500 MG tablet Take 1 tablet (500 mg total) by mouth 2 (two) times daily. (Patient not taking: Reported on 05/07/2018) 14 tablet 0  No Known Allergies  ROS:  Review of Systems   I have reviewed patient's Past Medical Hx, Surgical Hx, Family Hx, Social Hx, medications and allergies.   Physical Exam   Patient Vitals for the past 24 hrs:  BP Temp Temp src Pulse Resp SpO2 Height Weight  05/17/18 1942 110/61 - - (!) 106 18 - - -  05/17/18 1440 118/72 99.8 F (37.7 C) Oral (!) 121 19 98 % 5\' 5"  (1.651 m) 60.8 kg   Constitutional: Well-developed, well-nourished female in no acute distress.  Cardiovascular: normal rate Respiratory: normal effort GI: Abd soft, non-tender. Pos BS x 4 MS: Extremities nontender, no edema, normal ROM Neurologic: Alert and oriented x 4.  GU: Neg CVAT.  PELVIC EXAM: Cervix pink, visually closed, without lesion, small amount dark red bleeding, vaginal walls and external genitalia normal Bimanual exam: Cervix 0/long/high, firm, anterior, neg CMT, uterus tender, nonenlarged, adnexa without tenderness,  enlargement, or mass    LAB RESULTS Results for orders placed or performed during the hospital encounter of 05/17/18 (from the past 24 hour(s))  Urinalysis, Routine w reflex microscopic     Status: Abnormal   Collection Time: 05/17/18  2:51 PM  Result Value Ref Range   Color, Urine YELLOW YELLOW   APPearance HAZY (A) CLEAR   Specific Gravity, Urine 1.023 1.005 - 1.030   pH 6.0 5.0 - 8.0   Glucose, UA NEGATIVE NEGATIVE mg/dL   Hgb urine dipstick LARGE (A) NEGATIVE   Bilirubin Urine NEGATIVE NEGATIVE   Ketones, ur NEGATIVE NEGATIVE mg/dL   Protein, ur 30 (A) NEGATIVE mg/dL   Nitrite NEGATIVE NEGATIVE   Leukocytes, UA MODERATE (A) NEGATIVE   RBC / HPF 6-10 0 - 5 RBC/hpf   WBC, UA >50 (H) 0 - 5 WBC/hpf   Bacteria, UA NONE SEEN NONE SEEN   Squamous Epithelial / LPF 0-5 0 - 5   Mucus PRESENT   CBC with Differential/Platelet     Status: Abnormal   Collection Time: 05/17/18  3:49 PM  Result Value Ref Range   WBC 16.7 (H) 4.0 - 10.5 K/uL   RBC 3.32 (L) 3.87 - 5.11 MIL/uL   Hemoglobin 10.4 (L) 12.0 - 15.0 g/dL   HCT 16.131.1 (L) 09.636.0 - 04.546.0 %   MCV 93.7 80.0 - 100.0 fL   MCH 31.3 26.0 - 34.0 pg   MCHC 33.4 30.0 - 36.0 g/dL   RDW 40.912.8 81.111.5 - 91.415.5 %   Platelets 202 150 - 400 K/uL   nRBC 0.0 0.0 - 0.2 %   Neutrophils Relative % 83 %   Neutro Abs 13.9 (H) 1.7 - 7.7 K/uL   Lymphocytes Relative 12 %   Lymphs Abs 1.9 0.7 - 4.0 K/uL   Monocytes Relative 5 %   Monocytes Absolute 0.8 0.1 - 1.0 K/uL   Eosinophils Relative 0 %   Eosinophils Absolute 0.0 0.0 - 0.5 K/uL   Basophils Relative 0 %   Basophils Absolute 0.0 0.0 - 0.1 K/uL  hCG, quantitative, pregnancy     Status: Abnormal   Collection Time: 05/17/18  3:49 PM  Result Value Ref Range   hCG, Beta Chain, Quant, S 8,317 (H) <5 mIU/mL  Rh IG workup (includes ABO/Rh)     Status: None (Preliminary result)   Collection Time: 05/17/18  3:49 PM  Result Value Ref Range   Gestational Age(Wks) 7    ABO/RH(D) B NEG    Antibody Screen NEG     Unit Number [id number]  Blood Component Type RHIG    Unit division 00    Status of Unit ISSUED    Transfusion Status      OK TO TRANSFUSE Performed at [facility], [address] [place], [place] [address]   Wet prep, genital     Status: Abnormal   Collection Time: 05/17/18  4:05 PM  Result Value Ref Range   Yeast Wet Prep HPF POC NONE SEEN NONE SEEN   Trich, Wet Prep NONE SEEN NONE SEEN   Clue Cells Wet Prep HPF POC PRESENT (A) NONE SEEN   WBC, Wet Prep HPF POC MODERATE (A) NONE SEEN   Sperm NONE SEEN     --/--/B NEG (12/27 1549)  IMAGING [place] Ob Less Than 14 Weeks With Ob Transvaginal  Result Date: 05/17/2018 CLINICAL DATA:  Medical abortion on 05/14/2018. Increased pain since procedure. Quantitative beta HCG is 8317. LMP 03/14/2018. By LMP the patient is 9 weeks 1 day. EXAM: OBSTETRIC <14 WK US AND TRANSVAGINAL OB US TECHNIQUE: Both transabdominal and transvaginal ultrasound examinations were performed for complete evaluation of the gestation as well as the maternal uterus, adnexal regions, and pelvic cul-de-sac. Transvaginal technique was performed to assess early pregnancy. COMPARISON:  04/02/2017 FINDINGS: Intrauterine gestational sac: None Yolk sac:  Not Visualized. Embryo:  Not Visualized. Cardiac Activity: Not Visualized. Maternal uterus/adnexae: The endometrium is thickened, measuring 1.4 centimeters. The endometrium is mildly heterogeneous but not hypervascular by Doppler evaluation. No intrauterine gestational sac. The ovaries are normal in appearance. No free pelvic fluid. IMPRESSION: Thickened endometrial stripe, suspicious for retained products of conception. Electronically Signed   By: Norva PavlovElizabeth  Brown M.D.   On: 05/17/2018 18:35    MAU Management/MDM: Ordered labs and reviewed results. Dilaudid 1 mg given upon arrival in MAU with complete pain relief initially.  WBCs elevated at 16, US with thickened endometrium.   Consult Dr Macon LargeAnyanwu with assessment and  findings.  Treat for endometritis with Augmentin 875-125 BID x 7 days.  Tramadol 50 mg Q 4 hours PRN x 10 tabs.  F/U with [facility]. Return to MAU as needed for emergencies.  Pt discharged with strict return precautions.  ASSESSMENT 1. Abortion with endometritis   2. Abdominal pain during pregnancy in first trimester     PLAN Discharge home Allergies as of 05/17/2018   No Known Allergies     Medication List    STOP taking these medications   acetaminophen 325 MG tablet Commonly known as:  TYLENOL   metroNIDAZOLE 500 MG tablet Commonly known as:  FLAGYL     TAKE these medications   amoxicillin-clavulanate 875-125 MG tablet Commonly known as:  AUGMENTIN Take 1 tablet by mouth 2 (two) times daily for 7 days.   ibuprofen 200 MG tablet Commonly known as:  ADVIL,MOTRIN Take 200 mg by mouth every 6 (six) hours as needed.   traMADol 50 MG tablet Commonly known as:  ULTRAM Take 1 tablet (50 mg total) by mouth every 6 (six) hours as needed.      Follow-up Information    [facility] Follow up.   Why:  For follow up after your termination.  Return to MAU as needed for emergencies.           Sharen CounterLisa Leftwich-Kirby Certified Nurse-Midwife 05/17/2018  8:37 PM

## 2018-05-17 NOTE — MAU Note (Signed)
Pt reports she took the morning after pill on 12/24 and the follow up pills on 12/25. States the bleeding has slowed but she is having severe pain in her abd. Feels like her throat is swollen.

## 2018-05-18 ENCOUNTER — Encounter (HOSPITAL_COMMUNITY): Payer: Self-pay

## 2018-05-18 ENCOUNTER — Emergency Department (HOSPITAL_COMMUNITY)
Admission: EM | Admit: 2018-05-18 | Discharge: 2018-05-18 | Disposition: A | Discharge status: Home / Self Care | Payer: Medicaid Other | Attending: Emergency Medicine | Admitting: Emergency Medicine

## 2018-05-18 DIAGNOSIS — Z87891 Personal history of nicotine dependence: Secondary | ICD-10-CM | POA: Insufficient documentation

## 2018-05-18 DIAGNOSIS — B9789 Other viral agents as the cause of diseases classified elsewhere: Secondary | ICD-10-CM | POA: Insufficient documentation

## 2018-05-18 DIAGNOSIS — J029 Acute pharyngitis, unspecified: Secondary | ICD-10-CM

## 2018-05-18 DIAGNOSIS — J028 Acute pharyngitis due to other specified organisms: Secondary | ICD-10-CM | POA: Insufficient documentation

## 2018-05-18 LAB — RH IG WORKUP (INCLUDES ABO/RH)
ABO/RH(D): B NEG
Antibody Screen: NEGATIVE
Gestational Age(Wks): 7
Unit division: 0

## 2018-05-18 LAB — GROUP A STREP BY PCR: Group A Strep by PCR: NOT DETECTED

## 2018-05-18 MED ORDER — LIDOCAINE VISCOUS HCL 2 % MT SOLN: 15.0000 mL | OROMUCOSAL | 0 refills | Status: DC | PRN

## 2018-05-18 MED ORDER — LIDOCAINE VISCOUS HCL 2 % MT SOLN
15.0000 mL | Freq: Once | OROMUCOSAL | Status: AC
  Administered 2018-05-18: 15 mL via OROMUCOSAL
  Filled 2018-05-18: qty 15

## 2018-05-18 MED ORDER — ACETAMINOPHEN 325 MG PO TABS
650.0000 mg | ORAL_TABLET | Freq: Once | ORAL | Status: AC | PRN
  Administered 2018-05-18: 650 mg via ORAL
  Filled 2018-05-18: qty 2

## 2018-05-18 MED ORDER — AMOXICILLIN-POT CLAVULANATE 875-125 MG PO TABS
1.0000 | ORAL_TABLET | Freq: Once | ORAL | Status: AC
  Administered 2018-05-18: 1 via ORAL
  Filled 2018-05-18: qty 1

## 2018-05-18 NOTE — Discharge Instructions (Addendum)
You have been diagnosed today with viral pharyngitis.  At this time there does not appear to be the presence of an emergent medical condition, however there is always the potential for conditions to change. Please read and follow the below instructions.  Please return to the Emergency Department immediately for any new or worsening symptoms or if your symptoms do not improve within 2 days. Please be sure to follow up with your Primary Care Provider this week regarding your visit today; please call their office to schedule an appointment even if you are feeling better for a follow-up visit. Please take your Augmentin as prescribed by the [facility]. You may use the lidocaine mouth rinse as prescribed to help with your sore throat.  Do not swallow this medication.  Additionally please be sure to drink plenty of water and get plenty of rest to help with your symptoms.  Get help right away if: You have a stiff neck. You drool or cannot swallow liquids. You cannot drink or take medicines without throwing up. You have very bad pain that does not go away with medicine. You have problems breathing, and it is not from a stuffy nose. You have new pain and swelling in your knees, ankles, wrists, or elbows. You have large, tender lumps in your neck. You have a rash. You cough up green, yellow-brown, or bloody spit.  Please read the additional information packets attached to your discharge summary.  Do not take your medicine if  develop an itchy rash, swelling in your mouth or lips, or difficulty breathing.

## 2018-05-18 NOTE — ED Triage Notes (Signed)
Pt complains of a sore throat since yesterday, she states that she can't swallow and she can't eat

## 2018-05-18 NOTE — ED Notes (Signed)
Bed: WTR7 Expected date:  Expected time:  Means of arrival:  Comments: 

## 2018-05-18 NOTE — ED Provider Notes (Signed)
[facility]-EMERGENCY DEPT Provider Note   CSN: [id number] Arrival date & time: 05/18/18  0252     History   Chief Complaint Chief Complaint  Patient presents with  . Sore Throat    HPI Heather Fox is a 22 y.o. female presenting today for 3 days of sore throat.  Patient describes her sore throat as a bilateral burning sensation moderate intensity constant and worse with eating.  Nuys difficulty swallowing/breathing, fever, chest pain, shortness of breath, feeling of throat closing, rash or other concerns at this time.  Of note patient was seen at [facility] yesterday, diagnosis of endometritis following abortion performed on 12/24-12/25.  Patient was prescribed Augmentin twice daily last night and discharged.  Patient has not filled her prescription as of yet.  States that she plans to fill her prescription after she is discharged from her ED visit today.  HPI  Past Medical History:  Diagnosis Date  . GERD (gastroesophageal reflux disease)     Patient Active Problem List   Diagnosis Date Noted  . Vitamin D deficiency 04/27/2017  . GERD (gastroesophageal reflux disease) 12/21/2014  . Rh negative state in antepartum period 11/19/2014    Past Surgical History:  Procedure Laterality Date  . NO PAST SURGERIES    . WISDOM TOOTH EXTRACTION       OB History    Gravida  3   Para  2   Term  2   Preterm      AB      Living  2     SAB      TAB      Ectopic      Multiple  0   Live Births  2            Home Medications    Prior to Admission medications   Medication Sig Start Date End Date Taking? Authorizing Provider  amoxicillin-clavulanate (AUGMENTIN) 875-125 MG tablet Take 1 tablet by mouth 2 (two) times daily for 7 days. 05/17/18 05/24/18  Leftwich-Kirby, Wilmer Floor, CNM  ibuprofen (ADVIL,MOTRIN) 200 MG tablet Take 200 mg by mouth every 6 (six) hours as needed.    [provider]  lidocaine (XYLOCAINE) 2 %  solution Use as directed 15 mLs in the mouth or throat as needed for mouth pain (Do NOT swallow). 05/18/18   Harlene Salts A, PA-C  traMADol (ULTRAM) 50 MG tablet Take 1 tablet (50 mg total) by mouth every 6 (six) hours as needed. 05/17/18   Leftwich-Kirby, Wilmer Floor, CNM    Family History Family History  Problem Relation Age of Onset  . Stroke Mother   . Cancer Neg Hx   . Diabetes Neg Hx   . Heart disease Neg Hx   . Hypertension Neg Hx   . Hearing loss Neg Hx     Social History Social History   Tobacco Use  . Smoking status: Former Smoker    Packs/day: 0.50    Types: Cigarettes  . Smokeless tobacco: Never Used  . Tobacco comment: quit summer 2018  Substance Use Topics  . Alcohol use: No  . Drug use: Yes    Types: Marijuana    Comment: last was last wk     Allergies   Patient has no known allergies.   Review of Systems Review of Systems  Constitutional: Negative.  Negative for chills and fever.  HENT: Positive for congestion, rhinorrhea and sore throat. Negative for drooling, facial swelling, trouble swallowing and voice change.  Respiratory: Negative.  Negative for cough and shortness of breath.   Cardiovascular: Negative.  Negative for chest pain and leg swelling.  Gastrointestinal: Negative.  Negative for abdominal pain, diarrhea and vomiting.  Musculoskeletal: Negative.  Negative for arthralgias and myalgias.   Physical Exam Updated Vital Signs BP 108/68   Pulse (!) 105   Temp 100.3 F (37.9 C) (Oral)   Resp 18   LMP 03/14/2018   SpO2 99%   Physical Exam Constitutional:      General: She is not in acute distress.    Appearance: She is well-developed.  HENT:     Head: Normocephalic and atraumatic.     Right Ear: Hearing, tympanic membrane, ear canal and external ear normal.     Left Ear: Hearing, tympanic membrane, ear canal and external ear normal.     Nose: Rhinorrhea present. Rhinorrhea is clear.     Mouth/Throat:     Lips: Pink.     Pharynx:  Oropharynx is clear. Uvula midline.     Tonsils: Tonsillar exudate present. No tonsillar abscesses. Swelling: 1+ on the right. 1+ on the left.     Comments: The patient has normal phonation and is in control of secretions. No stridor.  Midline uvula without edema. Soft palate rises symmetrically. Mild tonsillar erythema with few exudate, minimal swelling, equal bilaterally. Tongue protrusion is normal, floor of mouth is soft. No trismus. No creptius on neck palpation. No gingival erythema or fluctuance noted. Mucus membranes moist. Eyes:     General: Vision grossly intact. Gaze aligned appropriately.     Extraocular Movements: Extraocular movements intact.     Conjunctiva/sclera: Conjunctivae normal.     Pupils: Pupils are equal, round, and reactive to light.  Neck:     Musculoskeletal: Full passive range of motion without pain, normal range of motion and neck supple.     Trachea: Trachea and phonation normal. No tracheal deviation.  Cardiovascular:     Rate and Rhythm: Normal rate and regular rhythm.     Heart sounds: Normal heart sounds.  Pulmonary:     Effort: Pulmonary effort is normal. No respiratory distress.     Breath sounds: Normal breath sounds and air entry.  Chest:     Chest wall: No deformity, tenderness or crepitus.  Abdominal:     Palpations: Abdomen is soft.     Tenderness: There is no abdominal tenderness. There is no guarding or rebound.  Musculoskeletal: Normal range of motion.  Skin:    General: Skin is warm and dry.  Neurological:     Mental Status: She is alert.     GCS: GCS eye subscore is 4. GCS verbal subscore is 5. GCS motor subscore is 6.     Comments: Speech is clear and goal oriented, follows commands Major Cranial nerves without deficit, no facial droop Moves extremities without ataxia, coordination intact Normal gait  Psychiatric:        Mood and Affect: Mood normal.        Behavior: Behavior normal.    ED Treatments / Results  Labs (all labs  ordered are listed, but only abnormal results are displayed) Labs Reviewed  GROUP A STREP BY PCR    EKG None  Radiology [place] Ob Less Than 14 Weeks With Ob Transvaginal  Result Date: 05/17/2018 CLINICAL DATA:  Medical abortion on 05/14/2018. Increased pain since procedure. Quantitative beta HCG is 8317. LMP 03/14/2018. By LMP the patient is 9 weeks 1 day. EXAM: OBSTETRIC <14 WK US AND TRANSVAGINAL  OB US TECHNIQUE: Both transabdominal and transvaginal ultrasound examinations were performed for complete evaluation of the gestation as well as the maternal uterus, adnexal regions, and pelvic cul-de-sac. Transvaginal technique was performed to assess early pregnancy. COMPARISON:  04/02/2017 FINDINGS: Intrauterine gestational sac: None Yolk sac:  Not Visualized. Embryo:  Not Visualized. Cardiac Activity: Not Visualized. Maternal uterus/adnexae: The endometrium is thickened, measuring 1.4 centimeters. The endometrium is mildly heterogeneous but not hypervascular by Doppler evaluation. No intrauterine gestational sac. The ovaries are normal in appearance. No free pelvic fluid. IMPRESSION: Thickened endometrial stripe, suspicious for retained products of conception. Electronically Signed   By: Norva Pavlov M.D.   On: 05/17/2018 18:35    Procedures Procedures (including critical care time)  Medications Ordered in ED Medications  acetaminophen (TYLENOL) tablet 650 mg (650 mg Oral Given 05/18/18 0641)  lidocaine (XYLOCAINE) 2 % viscous mouth solution 15 mL (15 mLs Mouth/Throat Given 05/18/18 0641)  amoxicillin-clavulanate (AUGMENTIN) 875-125 MG per tablet 1 tablet (1 tablet Oral Given 05/18/18 0641)    Initial Impression / Assessment and Plan / ED Course  I have reviewed the triage vital signs and the nursing notes.  Pertinent labs & imaging results that were available during my care of the patient were reviewed by me and considered in my medical decision making (see chart for details).      22 year old female presenting for 3-day history of sore throat.  Patient is borderline febrile, mildly tachycardic however she is nontoxic-appearing and in no acute distress.  Recent diagnosis of endometritis on Augmentin.  Strep test negative.  Presents with mild cervical lymphadenopathy & dysphagia; diagnosis of viral pharyngitis.  Discussed with Dr. Erma Heritage who agrees that patient may continue her current antibiotic course of Augmentin, this should cover bacterial pharyngitis as well.  Discussed likely of course with Dr. Erma Heritage who agrees that 7-day twice daily course of Augmentin is appropriate.  Patient has been given her first dose of Augmentin today and strongly encouraged to fill her prescription after discharge.  Patient without abdominal pain on my examination today.  Patient does not appear dehydrated, but did discuss importance of water rehydration. Presentation non-concerning for peritonsillar abscess, Ludwig's angina or retropharyngeal abscess. Patient is without trismus or uvula deviation.  Patient is able to drink water in ED without difficulty with intact air way.  No sign of other deep tissue infections of the head or neck.  Patient counseled on conservative home remedies for sore throat.  Patient given Lidoderm mouth rinse for symptomatic care.  At this time there does not appear to be any evidence of an acute emergency medical condition and the patient appears stable for discharge with appropriate outpatient follow up. Diagnosis was discussed with patient who verbalizes understanding of care plan and is agreeable to discharge. I have discussed return precautions with patient who verbalizes understanding of return precautions. Patient strongly encouraged to follow-up with their PCP this week. All questions answered.  Patient's case discussed with Dr. Erma Heritage who agrees with plan to discharge with viscous lidocaine and follow-up.   Note: Portions of this report may have been transcribed  using voice recognition software. Every effort was made to ensure accuracy; however, inadvertent computerized transcription errors may still be present. Final Clinical Impressions(s) / ED Diagnoses   Final diagnoses:  Viral pharyngitis    ED Discharge Orders         Ordered    lidocaine (XYLOCAINE) 2 % solution  As needed     05/18/18 0655  Elizabeth PalauMorelli, Hani Patnode A, PA-C 05/18/18 [id number]    Shaune PollackIsaacs, [place], MD 05/18/18 [phone]

## 2018-05-19 LAB — URINE CULTURE

## 2018-05-20 LAB — GC/CHLAMYDIA PROBE AMP (~~LOC~~) NOT AT ARMC
Chlamydia: NEGATIVE
NEISSERIA GONORRHEA: NEGATIVE

## 2018-08-14 ENCOUNTER — Other Ambulatory Visit: Payer: Self-pay

## 2018-08-14 ENCOUNTER — Other Ambulatory Visit: Payer: Self-pay | Admitting: Emergency Medicine

## 2018-08-14 ENCOUNTER — Emergency Department (HOSPITAL_COMMUNITY): Payer: Medicaid Other

## 2018-08-14 ENCOUNTER — Emergency Department (HOSPITAL_COMMUNITY)
Admission: EM | Admit: 2018-08-14 | Discharge: 2018-08-14 | Disposition: A | Discharge status: Home / Self Care | Payer: Medicaid Other | Attending: Emergency Medicine | Admitting: Emergency Medicine

## 2018-08-14 ENCOUNTER — Encounter (HOSPITAL_COMMUNITY): Payer: Self-pay | Admitting: Emergency Medicine

## 2018-08-14 DIAGNOSIS — N72 Inflammatory disease of cervix uteri: Secondary | ICD-10-CM | POA: Diagnosis not present

## 2018-08-14 DIAGNOSIS — Z79899 Other long term (current) drug therapy: Secondary | ICD-10-CM | POA: Insufficient documentation

## 2018-08-14 DIAGNOSIS — A599 Trichomoniasis, unspecified: Secondary | ICD-10-CM

## 2018-08-14 DIAGNOSIS — Z87891 Personal history of nicotine dependence: Secondary | ICD-10-CM | POA: Insufficient documentation

## 2018-08-14 DIAGNOSIS — R1031 Right lower quadrant pain: Secondary | ICD-10-CM | POA: Diagnosis present

## 2018-08-14 LAB — CBC WITH DIFFERENTIAL/PLATELET
Abs Immature Granulocytes: 0.02 10*3/uL (ref 0.00–0.07)
Basophils Absolute: 0 10*3/uL (ref 0.0–0.1)
Basophils Relative: 0 %
EOS ABS: 0 10*3/uL (ref 0.0–0.5)
Eosinophils Relative: 0 %
HCT: 40.5 % (ref 36.0–46.0)
Hemoglobin: 12.9 g/dL (ref 12.0–15.0)
Immature Granulocytes: 0 %
Lymphocytes Relative: 18 %
Lymphs Abs: 1.6 10*3/uL (ref 0.7–4.0)
MCH: 29.6 pg (ref 26.0–34.0)
MCHC: 31.9 g/dL (ref 30.0–36.0)
MCV: 92.9 fL (ref 80.0–100.0)
MONOS PCT: 6 %
Monocytes Absolute: 0.5 10*3/uL (ref 0.1–1.0)
Neutro Abs: 6.8 10*3/uL (ref 1.7–7.7)
Neutrophils Relative %: 76 %
Platelets: 248 10*3/uL (ref 150–400)
RBC: 4.36 MIL/uL (ref 3.87–5.11)
RDW: 13.2 % (ref 11.5–15.5)
WBC: 9 10*3/uL (ref 4.0–10.5)
nRBC: 0 % (ref 0.0–0.2)

## 2018-08-14 LAB — COMPREHENSIVE METABOLIC PANEL
ALT: 15 U/L (ref 0–44)
AST: 32 U/L (ref 15–41)
Albumin: 5 g/dL (ref 3.5–5.0)
Alkaline Phosphatase: 67 U/L (ref 38–126)
Anion gap: 11 (ref 5–15)
BUN: 9 mg/dL (ref 6–20)
CO2: 22 mmol/L (ref 22–32)
Calcium: 9.6 mg/dL (ref 8.9–10.3)
Chloride: 103 mmol/L (ref 98–111)
Creatinine, Ser: 0.79 mg/dL (ref 0.44–1.00)
GFR calc Af Amer: >60 mL/min (ref >60)
GFR calc non Af Amer: >60 mL/min (ref >60)
Glucose, Bld: 84 mg/dL (ref 70–99)
Potassium: 4.4 mmol/L (ref 3.5–5.1)
Sodium: 136 mmol/L (ref 135–145)
Total Bilirubin: 1 mg/dL (ref 0.3–1.2)
Total Protein: 8.5 g/dL — ABNORMAL HIGH (ref 6.5–8.1)

## 2018-08-14 LAB — POC URINE PREG, ED: PREG TEST UR: NEGATIVE

## 2018-08-14 LAB — URINALYSIS, ROUTINE W REFLEX MICROSCOPIC
Bilirubin Urine: NEGATIVE
Glucose, UA: NEGATIVE mg/dL
Hgb urine dipstick: NEGATIVE
Ketones, ur: NEGATIVE mg/dL
LEUKOCYTE UA: NEGATIVE
Nitrite: NEGATIVE
PROTEIN: NEGATIVE mg/dL
Specific Gravity, Urine: 1.021 (ref 1.005–1.030)
pH: 9 — ABNORMAL HIGH (ref 5.0–8.0)

## 2018-08-14 LAB — LIPASE, BLOOD: LIPASE: 27 U/L (ref 11–51)

## 2018-08-14 LAB — WET PREP, GENITAL
Sperm: NONE SEEN
Yeast Wet Prep HPF POC: NONE SEEN

## 2018-08-14 MED ORDER — AZITHROMYCIN 250 MG PO TABS
1000.0000 mg | ORAL_TABLET | Freq: Once | ORAL | Status: AC
  Administered 2018-08-14: 1000 mg via ORAL
  Filled 2018-08-14: qty 4

## 2018-08-14 MED ORDER — MORPHINE SULFATE (PF) 4 MG/ML IV SOLN
4.0000 mg | Freq: Once | INTRAVENOUS | Status: AC
  Administered 2018-08-14: 4 mg via INTRAVENOUS
  Filled 2018-08-14: qty 1

## 2018-08-14 MED ORDER — CEFTRIAXONE SODIUM 250 MG IJ SOLR
250.0000 mg | Freq: Once | INTRAMUSCULAR | Status: AC
  Administered 2018-08-14: 250 mg via INTRAMUSCULAR
  Filled 2018-08-14: qty 250

## 2018-08-14 MED ORDER — LIDOCAINE HCL (PF) 1 % IJ SOLN
INTRAMUSCULAR | Status: AC
  Administered 2018-08-14: 14:00:00
  Filled 2018-08-14: qty 5

## 2018-08-14 MED ORDER — METRONIDAZOLE 500 MG PO TABS
2000.0000 mg | ORAL_TABLET | Freq: Once | ORAL | Status: AC
  Administered 2018-08-14: 2000 mg via ORAL
  Filled 2018-08-14: qty 4

## 2018-08-14 MED ORDER — ONDANSETRON HCL 4 MG/2ML IJ SOLN
4.0000 mg | Freq: Once | INTRAMUSCULAR | Status: AC
  Administered 2018-08-14: 4 mg via INTRAVENOUS
  Filled 2018-08-14: qty 2

## 2018-08-14 NOTE — ED Provider Notes (Signed)
[facility] EMERGENCY DEPARTMENT Provider Note   CSN: [id number] Arrival date & time: 08/14/18  1017    History   Chief Complaint No chief complaint on file.   HPI Heather Fox is a 23 y.o. female.     The history is provided by the patient. No language interpreter was used.  Abdominal Pain     23 year old female with history of GERD presenting for evaluation of abdominal pain.  Patient developed right lower quadrant abdominal pain since this morning.  She described pain as a severe cramping sensation, persistent, nothing seems to make it better or worse.  She does not chills.  Rates the pain is 10 out of 10 nonradiating.  She does not complain of any nausea or vomiting or diarrhea, denies dysuria, hematuria, vaginal bleeding or vaginal discharge.  She mention that yesterday she felt fine.  Denies any recent travel or recent sick contact.  No URI symptoms.  No specific treatment tried.  Past Medical History:  Diagnosis Date   GERD (gastroesophageal reflux disease)     Patient Active Problem List   Diagnosis Date Noted   Vitamin D deficiency 04/27/2017   GERD (gastroesophageal reflux disease) 12/21/2014   Rh negative state in antepartum period 11/19/2014    Past Surgical History:  Procedure Laterality Date   NO PAST SURGERIES     WISDOM TOOTH EXTRACTION       OB History    Gravida  3   Para  2   Term  2   Preterm      AB      Living  2     SAB      TAB      Ectopic      Multiple  0   Live Births  2            Home Medications    Prior to Admission medications   Medication Sig Start Date End Date Taking? Authorizing Provider  ibuprofen (ADVIL,MOTRIN) 200 MG tablet Take 200 mg by mouth every 6 (six) hours as needed.    [provider]  lidocaine (XYLOCAINE) 2 % solution Use as directed 15 mLs in the mouth or throat as needed for mouth pain (Do NOT swallow). 05/18/18   Harlene Salts A, PA-C  traMADol  (ULTRAM) 50 MG tablet Take 1 tablet (50 mg total) by mouth every 6 (six) hours as needed. 05/17/18   Leftwich-Kirby, Wilmer Floor, CNM    Family History Family History  Problem Relation Age of Onset   Stroke Mother    Cancer Neg Hx    Diabetes Neg Hx    Heart disease Neg Hx    Hypertension Neg Hx    Hearing loss Neg Hx     Social History Social History   Tobacco Use   Smoking status: Former Smoker    Packs/day: 0.50    Types: Cigarettes   Smokeless tobacco: Never Used   Tobacco comment: quit summer 2018  Substance Use Topics   Alcohol use: No   Drug use: Yes    Types: Marijuana    Comment: last was last wk     Allergies   Patient has no known allergies.   Review of Systems Review of Systems  Gastrointestinal: Positive for abdominal pain.  All other systems reviewed and are negative.    Physical Exam Updated Vital Signs LMP 03/14/2018   Physical Exam Vitals signs and nursing note reviewed.  Constitutional:  General: She is not in acute distress.    Appearance: She is well-developed.  HENT:     Head: Atraumatic.  Eyes:     Conjunctiva/sclera: Conjunctivae normal.  Neck:     Musculoskeletal: Neck supple.  Cardiovascular:     Rate and Rhythm: Normal rate and regular rhythm.  Pulmonary:     Effort: Pulmonary effort is normal.     Breath sounds: Normal breath sounds.  Abdominal:     General: Abdomen is flat.     Palpations: Abdomen is soft.     Tenderness: There is abdominal tenderness in the right lower quadrant. Negative signs include Murphy's sign, Rovsing's sign, McBurney's sign and psoas sign.  Genitourinary:    Comments: Chaperone present during exam.  No inguinal lymphadenopathy or inguinal hernia noted.  Normal external genitalia.  No significant discomfort with speculum insertion.  Normal functional white vaginal discharge noted. Closed cervical os free of lesion or rash.  On bimanual examination, right adnexal tenderness with cervical  motion tenderness.  Right adnexa is full. Skin:    Findings: No rash.  Neurological:     Mental Status: She is alert.      ED Treatments / Results  Labs (all labs ordered are listed, but only abnormal results are displayed) Labs Reviewed  WET PREP, GENITAL - Abnormal; Notable for the following components:      Result Value   Trich, Wet Prep PRESENT (*)    Clue Cells Wet Prep HPF POC PRESENT (*)    WBC, Wet Prep HPF POC MANY (*)    All other components within normal limits  COMPREHENSIVE METABOLIC PANEL - Abnormal; Notable for the following components:   Total Protein 8.5 (*)    All other components within normal limits  URINALYSIS, ROUTINE W REFLEX MICROSCOPIC - Abnormal; Notable for the following components:   pH 9.0 (*)    All other components within normal limits  CBC WITH DIFFERENTIAL/PLATELET  LIPASE, BLOOD  HIV ANTIBODY (ROUTINE TESTING W REFLEX)  RPR  POC URINE PREG, ED  GC/CHLAMYDIA PROBE AMP ([place]) NOT AT [facility]    EKG None  Radiology US Transvaginal Non-ob  Result Date: 08/14/2018 CLINICAL DATA:  Right pelvic pain for 2 days. EXAM: TRANSABDOMINAL AND TRANSVAGINAL ULTRASOUND OF PELVIS DOPPLER ULTRASOUND OF OVARIES TECHNIQUE: Both transabdominal and transvaginal ultrasound examinations of the pelvis were performed. Transabdominal technique was performed for global imaging of the pelvis including uterus, ovaries, adnexal regions, and pelvic cul-de-sac. It was necessary to proceed with endovaginal exam following the transabdominal exam to visualize the right adnexa and endometrium. Color and duplex Doppler ultrasound was utilized to evaluate blood flow to the ovaries. COMPARISON:  None. FINDINGS: Uterus Measurements: 10.1 x 4.7 x 5.3 cm = volume: 132 mL. No fibroids or other mass visualized. Endometrium Thickness: 9.8 mm.  No focal abnormality visualized. Right ovary Measurements: 5.9 x 3.2 x 4.0 cm = volume: 39 mL. There is a benign-appearing cyst which measures 3.2  x 2.8 x 2.7 cm. Left ovary Measurements: 4.3 x 2.7 x 3.1 cm = volume: 19 mL. There is a benign-appearing cyst which measures 2.1 x 1.5 x 1.6 cm. Pulsed Doppler evaluation of both ovaries demonstrates normal low-resistance arterial and venous waveforms. Other findings No abnormal free fluid. IMPRESSION: Normal appearance of the uterus. Bilateral benign-appearing ovarian cysts. Electronically Signed   By: Ted Mcalpine M.D.   On: 08/14/2018 12:28   US Pelvis Complete  Result Date: 08/14/2018 CLINICAL DATA:  Right pelvic pain for 2  days. EXAM: TRANSABDOMINAL AND TRANSVAGINAL ULTRASOUND OF PELVIS DOPPLER ULTRASOUND OF OVARIES TECHNIQUE: Both transabdominal and transvaginal ultrasound examinations of the pelvis were performed. Transabdominal technique was performed for global imaging of the pelvis including uterus, ovaries, adnexal regions, and pelvic cul-de-sac. It was necessary to proceed with endovaginal exam following the transabdominal exam to visualize the right adnexa and endometrium. Color and duplex Doppler ultrasound was utilized to evaluate blood flow to the ovaries. COMPARISON:  None. FINDINGS: Uterus Measurements: 10.1 x 4.7 x 5.3 cm = volume: 132 mL. No fibroids or other mass visualized. Endometrium Thickness: 9.8 mm.  No focal abnormality visualized. Right ovary Measurements: 5.9 x 3.2 x 4.0 cm = volume: 39 mL. There is a benign-appearing cyst which measures 3.2 x 2.8 x 2.7 cm. Left ovary Measurements: 4.3 x 2.7 x 3.1 cm = volume: 19 mL. There is a benign-appearing cyst which measures 2.1 x 1.5 x 1.6 cm. Pulsed Doppler evaluation of both ovaries demonstrates normal low-resistance arterial and venous waveforms. Other findings No abnormal free fluid. IMPRESSION: Normal appearance of the uterus. Bilateral benign-appearing ovarian cysts. Electronically Signed   By: Ted Mcalpine M.D.   On: 08/14/2018 12:28   [place] Art/ven Flow Abd Pelv Doppler  Result Date: 08/14/2018 CLINICAL DATA:  Right  pelvic pain for 2 days. EXAM: TRANSABDOMINAL AND TRANSVAGINAL ULTRASOUND OF PELVIS DOPPLER ULTRASOUND OF OVARIES TECHNIQUE: Both transabdominal and transvaginal ultrasound examinations of the pelvis were performed. Transabdominal technique was performed for global imaging of the pelvis including uterus, ovaries, adnexal regions, and pelvic cul-de-sac. It was necessary to proceed with endovaginal exam following the transabdominal exam to visualize the right adnexa and endometrium. Color and duplex Doppler ultrasound was utilized to evaluate blood flow to the ovaries. COMPARISON:  None. FINDINGS: Uterus Measurements: 10.1 x 4.7 x 5.3 cm = volume: 132 mL. No fibroids or other mass visualized. Endometrium Thickness: 9.8 mm.  No focal abnormality visualized. Right ovary Measurements: 5.9 x 3.2 x 4.0 cm = volume: 39 mL. There is a benign-appearing cyst which measures 3.2 x 2.8 x 2.7 cm. Left ovary Measurements: 4.3 x 2.7 x 3.1 cm = volume: 19 mL. There is a benign-appearing cyst which measures 2.1 x 1.5 x 1.6 cm. Pulsed Doppler evaluation of both ovaries demonstrates normal low-resistance arterial and venous waveforms. Other findings No abnormal free fluid. IMPRESSION: Normal appearance of the uterus. Bilateral benign-appearing ovarian cysts. Electronically Signed   By: Ted Mcalpine M.D.   On: 08/14/2018 12:28    Procedures Procedures (including critical care time)  Medications Ordered in ED Medications  lidocaine (PF) (XYLOCAINE) 1 % injection (has no administration in time range)  morphine 4 MG/ML injection 4 mg (4 mg Intravenous Given 08/14/18 1114)  ondansetron (ZOFRAN) injection 4 mg (4 mg Intravenous Given 08/14/18 1114)  cefTRIAXone (ROCEPHIN) injection 250 mg (250 mg Intramuscular Given 08/14/18 1249)  azithromycin (ZITHROMAX) tablet 1,000 mg (1,000 mg Oral Given 08/14/18 1245)  metroNIDAZOLE (FLAGYL) tablet 2,000 mg (2,000 mg Oral Given 08/14/18 1245)     Initial Impression / Assessment and  Plan / ED Course  I have reviewed the triage vital signs and the nursing notes.  Pertinent labs & imaging results that were available during my care of the patient were reviewed by me and considered in my medical decision making (see chart for details).        BP 113/90 (BP Location: Right Arm)    Pulse 76    Temp 98 F (36.7 C) (Oral)    Resp 17  Ht  (1.651 m)    Wt 61.2 kg    LMP 07/30/2018    SpO2 100%    BMI 22.47 kg/m    Final Clinical Impressions(s) / ED Diagnoses   Final diagnoses:  Cervicitis  Trichomoniasis    ED Discharge Orders    None     10:46 AM Patient here with pain to her right lower quadrant since early this morning.  She denies any recent sexual activities which makes it less likely to be ovarian torsion.  She does appear uncomfortable.  1:02 PM Pelvic ultrasound show no acute finding.  Wet prep did show evidence of trichomonas which she will be treated with Flagyl.  Patient also given Rocephin and Zithromax to cover for potential STI.  Although she does have clue cells in her wet prep she does not have other symptoms to suggest active BV.  I encourage patient to notify partner to get tested for trichomonas.  At this time I have low suspicion for appendicitis however patient made aware to return if her condition worsen for further evaluation and serial abdominal exam.  At this time patient felt better and stable for discharge.  Work note provided as requested.   Fayrene Helper, PA-C 08/14/18 1308    Benjiman Core, MD 08/14/18 [id number]

## 2018-08-14 NOTE — ED Notes (Signed)
Patient verbalizes understanding of discharge instructions . Opportunity for questions and answers were provided . Armband removed by staff ,Pt discharged from ED. W/C  offered at D/C  and Declined W/C at D/C and was escorted to lobby by RN.  

## 2018-08-14 NOTE — ED Triage Notes (Signed)
Pt. Stated, Im having rt. Sided stomach pain started yesterday,  No urine or bowel problems all normal.  Denies any N/V/D

## 2018-08-15 LAB — HIV ANTIBODY (ROUTINE TESTING W REFLEX): HIV SCREEN 4TH GENERATION: NONREACTIVE

## 2018-08-15 LAB — CERVICOVAGINAL ANCILLARY ONLY
Chlamydia: POSITIVE — AB
Neisseria Gonorrhea: NEGATIVE

## 2018-08-15 LAB — RPR: RPR Ser Ql: NONREACTIVE

## 2018-12-05 ENCOUNTER — Emergency Department (HOSPITAL_COMMUNITY)
Admission: EM | Admit: 2018-12-05 | Discharge: 2018-12-05 | Disposition: A | Discharge status: Home / Self Care | Payer: Medicaid Other | Attending: Emergency Medicine | Admitting: Emergency Medicine

## 2018-12-05 ENCOUNTER — Emergency Department (HOSPITAL_COMMUNITY): Payer: Medicaid Other

## 2018-12-05 ENCOUNTER — Encounter (HOSPITAL_COMMUNITY): Payer: Self-pay | Admitting: Emergency Medicine

## 2018-12-05 ENCOUNTER — Other Ambulatory Visit: Payer: Self-pay

## 2018-12-05 DIAGNOSIS — Z87891 Personal history of nicotine dependence: Secondary | ICD-10-CM | POA: Insufficient documentation

## 2018-12-05 DIAGNOSIS — R21 Rash and other nonspecific skin eruption: Secondary | ICD-10-CM | POA: Insufficient documentation

## 2018-12-05 DIAGNOSIS — R002 Palpitations: Secondary | ICD-10-CM | POA: Insufficient documentation

## 2018-12-05 DIAGNOSIS — F1290 Cannabis use, unspecified, uncomplicated: Secondary | ICD-10-CM | POA: Insufficient documentation

## 2018-12-05 DIAGNOSIS — T7840XA Allergy, unspecified, initial encounter: Secondary | ICD-10-CM | POA: Insufficient documentation

## 2018-12-05 LAB — I-STAT BETA HCG BLOOD, ED (MC, WL, AP ONLY): I-stat hCG, quantitative: <5 m[IU]/mL (ref <5)

## 2018-12-05 MED ORDER — HYDROXYZINE HCL 25 MG PO TABS
50.0000 mg | ORAL_TABLET | Freq: Once | ORAL | Status: AC
  Administered 2018-12-05: 50 mg via ORAL
  Filled 2018-12-05: qty 2

## 2018-12-05 NOTE — ED Provider Notes (Signed)
[facility] Provider Note   CSN: [id number] Arrival date & time: 12/05/18  [id number]    History   Chief Complaint Chief Complaint  Patient presents with  . Allergic Reaction    HPI Heather Fox is a 23 y.o. female.     The history is provided by the patient.  Allergic Reaction Presenting symptoms: difficulty breathing   Presenting symptoms: no rash   Severity:  Mild Prior allergic episodes:  No prior episodes Context: medications (possibly prednisone that was given for poison ivy. )   Relieved by:  Antihistamines Worsened by:  Nothing   Past Medical History:  Diagnosis Date  . GERD (gastroesophageal reflux disease)     Patient Active Problem List   Diagnosis Date Noted  . Vitamin D deficiency 04/27/2017  . GERD (gastroesophageal reflux disease) 12/21/2014  . Rh negative state in antepartum period 11/19/2014    Past Surgical History:  Procedure Laterality Date  . NO PAST SURGERIES    . WISDOM TOOTH EXTRACTION       OB History    Gravida  3   Para  2   Term  2   Preterm      AB      Living  2     SAB      TAB      Ectopic      Multiple  0   Live Births  2            Home Medications    Prior to Admission medications   Medication Sig Start Date End Date Taking? Authorizing Provider  lidocaine (XYLOCAINE) 2 % solution Use as directed 15 mLs in the mouth or throat as needed for mouth pain (Do NOT swallow). Patient not taking: Reported on 08/14/2018 05/18/18   Harlene SaltsMorelli, Brandon A, PA-C  traMADol (ULTRAM) 50 MG tablet Take 1 tablet (50 mg total) by mouth every 6 (six) hours as needed. Patient not taking: Reported on 08/14/2018 05/17/18   Hurshel PartyLeftwich-Kirby, Lisa A, CNM    Family History Family History  Problem Relation Age of Onset  . Stroke Mother   . Cancer Neg Hx   . Diabetes Neg Hx   . Heart disease Neg Hx   . Hypertension Neg Hx   . Hearing loss Neg Hx     Social History Social History    Tobacco Use  . Smoking status: Former Smoker    Packs/day: 0.50    Types: Cigarettes  . Smokeless tobacco: Never Used  . Tobacco comment: quit summer 2018  Substance Use Topics  . Alcohol use: No  . Drug use: Yes    Types: Marijuana    Comment: last was last wk     Allergies   Naproxen   Review of Systems Review of Systems  Constitutional: Negative for chills and fever.  HENT: Negative for ear pain and sore throat.   Eyes: Negative for pain and visual disturbance.  Respiratory: Positive for shortness of breath. Negative for cough.   Cardiovascular: Positive for palpitations. Negative for chest pain.  Gastrointestinal: Negative for abdominal pain and vomiting.  Genitourinary: Negative for dysuria and hematuria.  Musculoskeletal: Negative for arthralgias and back pain.  Skin: Negative for color change and rash.  Neurological: Negative for seizures and syncope.  All other systems reviewed and are negative.    Physical Exam Updated Vital Signs  ED Triage Vitals  Enc Vitals Group     BP 12/05/18 0932 131/82  Pulse Rate 12/05/18 0932 (!) 112     Resp 12/05/18 0932 18     Temp 12/05/18 0935 99 F (37.2 C)     Temp Source 12/05/18 0935 Oral     SpO2 12/05/18 0932 100 %     Weight --      Height --      Head Circumference --      Peak Flow --      Pain Score 12/05/18 0933 0     Pain Loc --      Pain Edu? --      Excl. in [place]? --     Physical Exam Vitals signs and nursing note reviewed.  Constitutional:      General: She is not in acute distress.    Appearance: She is well-developed.  HENT:     Head: Normocephalic and atraumatic.     Nose: Nose normal.     Mouth/Throat:     Mouth: Mucous membranes are moist.     Pharynx: No oropharyngeal exudate or posterior oropharyngeal erythema.  Eyes:     Extraocular Movements: Extraocular movements intact.     Conjunctiva/sclera: Conjunctivae normal.     Pupils: Pupils are equal, round, and reactive to light.   Neck:     Musculoskeletal: Normal range of motion and neck supple. No muscular tenderness.  Cardiovascular:     Rate and Rhythm: Normal rate and regular rhythm.     Pulses: Normal pulses.     Heart sounds: Normal heart sounds. No murmur.  Pulmonary:     Effort: Pulmonary effort is normal. No respiratory distress.     Breath sounds: Normal breath sounds.  Abdominal:     Palpations: Abdomen is soft.     Tenderness: There is no abdominal tenderness.  Musculoskeletal: Normal range of motion.        General: No tenderness.  Skin:    General: Skin is warm and dry.     Capillary Refill: Capillary refill takes less than 2 seconds.     Findings: Rash present.  Neurological:     General: No focal deficit present.     Mental Status: She is alert.  Psychiatric:        Mood and Affect: Mood normal.      ED Treatments / Results  Labs (all labs ordered are listed, but only abnormal results are displayed) Labs Reviewed  I-STAT BETA HCG BLOOD, ED ([facility], [facility], [facility] ONLY)    EKG EKG Interpretation  Date/Time:  Thursday December 05 2018 09:37:20 EDT Ventricular Rate:  107 PR Interval:    QRS Duration: 88 QT Interval:  315 QTC Calculation: 421 R Axis:   60 Text Interpretation:  Sinus tachycardia RSR' in V1 or V2, probably normal variant Confirmed by Lennice Sites [id number]) on 12/05/2018 9:41:19 AM   Radiology Dg Chest Portable 1 View  Result Date: 12/05/2018 CLINICAL DATA:  Shortness of breath EXAM: PORTABLE CHEST 1 VIEW COMPARISON:  09/29/2016 FINDINGS: The heart size and mediastinal contours are within normal limits. Both lungs are clear. The visualized skeletal structures are unremarkable. IMPRESSION: No active disease. Electronically Signed   By: Inez Catalina M.D.   On: 12/05/2018 09:55    Procedures Procedures (including critical care time)  Medications Ordered in ED Medications  hydrOXYzine (ATARAX/VISTARIL) tablet 50 mg (50 mg Oral Given 12/05/18 0954)     Initial Impression /  Assessment and Plan / ED Course  I have reviewed the triage vital signs and the nursing notes.  Pertinent labs & imaging results that were available during my care of the patient were reviewed by me and considered in my medical decision making (see chart for details).     Heather Fox is a 23 year old female with history of reflux who presents to the ED with palpitations.  Patient with mild tachycardia but otherwise normal vitals.  Patient feels that she has had palpitations ever since she took a dose of prednisone last night for her poison ivy.  Patient has mild rash to the left arm.  Recommend that she discontinue prednisone.  There is no signs of anaphylaxis.  Breath sounds are clear.  No fever.  No infectious symptoms.  Overall patient appears anxious.  Will treat with Vistaril.  Will obtain an EKG and a chest x-ray.  At this time no concern for cardiac or pulmonary issues such as PE or ACS.  Chest x-ray without any signs of pneumonia, no pneumothorax, no pleural effusion.  Pregnancy test is negative.  EKG shows sinus tachycardia at 107.  Tachycardia resolved quickly as patient's anxiety appeared to improve.  She is given reassurance.  Do not think patient has a PE or cardiac problem at this time.  No concern for severe allergic reaction.  Possibly anxiety and palpitations could be secondary to prednisone use.  Symptoms improved with Vistaril.  Patient was observed in the ED with overall improvement of symptoms.  Heart rate was normal throughout my care except for initial triage.  Patient was texting on her phone when reevaluated.  Anxiety appears to have improved.  Clear breath sounds.  No concern for ongoing allergic reaction. Recommend that she discontinue prednisone and continue to take Benadryl as needed for her poison ivy.  This chart was dictated using voice recognition software.  Despite best efforts to proofread,  errors can occur which can change the documentation meaning.    Final  Clinical Impressions(s) / ED Diagnoses   Final diagnoses:  Allergic reaction, initial encounter    ED Discharge Orders    None       Virgina NorfolkCuratolo, Louann Hopson, DO 12/05/18 1017

## 2018-12-05 NOTE — Discharge Instructions (Addendum)
Please return to the emergency department if your symptoms worsen.  However, recommend rest, hydration, further use of Benadryl as needed.

## 2018-12-05 NOTE — ED Triage Notes (Signed)
Pt reports going to [facility] yesterday due to poison ivy on both of her arms and she was started on prednisone- pt states since taking this medication she feels "jittery and anxious". Pt has no sob or chest pain.

## 2019-04-22 ENCOUNTER — Emergency Department (HOSPITAL_COMMUNITY)
Admission: EM | Admit: 2019-04-22 | Discharge: 2019-04-22 | Disposition: A | Discharge status: Home / Self Care | Payer: Self-pay | Attending: Emergency Medicine | Admitting: Emergency Medicine

## 2019-04-22 ENCOUNTER — Encounter (HOSPITAL_COMMUNITY): Payer: Self-pay | Admitting: Emergency Medicine

## 2019-04-22 ENCOUNTER — Other Ambulatory Visit: Payer: Self-pay

## 2019-04-22 DIAGNOSIS — R1031 Right lower quadrant pain: Secondary | ICD-10-CM | POA: Insufficient documentation

## 2019-04-22 DIAGNOSIS — Z5321 Procedure and treatment not carried out due to patient leaving prior to being seen by health care provider: Secondary | ICD-10-CM | POA: Insufficient documentation

## 2019-04-22 LAB — COMPREHENSIVE METABOLIC PANEL
ALT: 15 U/L (ref 0–44)
AST: 21 U/L (ref 15–41)
Albumin: 4.1 g/dL (ref 3.5–5.0)
Alkaline Phosphatase: 54 U/L (ref 38–126)
Anion gap: 11 (ref 5–15)
BUN: 7 mg/dL (ref 6–20)
CO2: 25 mmol/L (ref 22–32)
Calcium: 9.2 mg/dL (ref 8.9–10.3)
Chloride: 105 mmol/L (ref 98–111)
Creatinine, Ser: 0.79 mg/dL (ref 0.44–1.00)
GFR calc Af Amer: >60 mL/min (ref >60)
GFR calc non Af Amer: >60 mL/min (ref >60)
Glucose, Bld: 82 mg/dL (ref 70–99)
Potassium: 4 mmol/L (ref 3.5–5.1)
Sodium: 141 mmol/L (ref 135–145)
Total Bilirubin: 0.4 mg/dL (ref 0.3–1.2)
Total Protein: 7.4 g/dL (ref 6.5–8.1)

## 2019-04-22 LAB — URINALYSIS, ROUTINE W REFLEX MICROSCOPIC
Bilirubin Urine: NEGATIVE
Glucose, UA: NEGATIVE mg/dL
Hgb urine dipstick: NEGATIVE
Ketones, ur: NEGATIVE mg/dL
Nitrite: NEGATIVE
Protein, ur: NEGATIVE mg/dL
Specific Gravity, Urine: 1.016 (ref 1.005–1.030)
pH: 8 (ref 5.0–8.0)

## 2019-04-22 LAB — CBC
HCT: 42 % (ref 36.0–46.0)
Hemoglobin: 13.4 g/dL (ref 12.0–15.0)
MCH: 30.7 pg (ref 26.0–34.0)
MCHC: 31.9 g/dL (ref 30.0–36.0)
MCV: 96.3 fL (ref 80.0–100.0)
Platelets: 209 10*3/uL (ref 150–400)
RBC: 4.36 MIL/uL (ref 3.87–5.11)
RDW: 13 % (ref 11.5–15.5)
WBC: 6.9 10*3/uL (ref 4.0–10.5)
nRBC: 0 % (ref 0.0–0.2)

## 2019-04-22 LAB — LIPASE, BLOOD: Lipase: 24 U/L (ref 11–51)

## 2019-04-22 LAB — I-STAT BETA HCG BLOOD, ED (MC, WL, AP ONLY): I-stat hCG, quantitative: <5 m[IU]/mL (ref <5)

## 2019-04-22 NOTE — ED Triage Notes (Signed)
Pt here for two weeks of RLQ abdominal pain. Pt sts the pain is worse after eating something heavy. Denies n/v/d.

## 2019-04-22 NOTE — ED Notes (Signed)
Pt called x3 in the waiting room. No reply. 

## 2019-08-28 ENCOUNTER — Emergency Department (HOSPITAL_COMMUNITY)
Admission: EM | Admit: 2019-08-28 | Discharge: 2019-08-28 | Disposition: A | Discharge status: Home / Self Care | Payer: Medicaid Other | Attending: Emergency Medicine | Admitting: Emergency Medicine

## 2019-08-28 ENCOUNTER — Encounter (HOSPITAL_COMMUNITY): Payer: Self-pay | Admitting: *Deleted

## 2019-08-28 ENCOUNTER — Other Ambulatory Visit: Payer: Self-pay

## 2019-08-28 DIAGNOSIS — Z87891 Personal history of nicotine dependence: Secondary | ICD-10-CM | POA: Insufficient documentation

## 2019-08-28 DIAGNOSIS — G43909 Migraine, unspecified, not intractable, without status migrainosus: Secondary | ICD-10-CM | POA: Insufficient documentation

## 2019-08-28 DIAGNOSIS — R519 Headache, unspecified: Secondary | ICD-10-CM | POA: Diagnosis present

## 2019-08-28 DIAGNOSIS — G43009 Migraine without aura, not intractable, without status migrainosus: Secondary | ICD-10-CM

## 2019-08-28 LAB — CBC
HCT: 39.6 % (ref 36.0–46.0)
Hemoglobin: 12.8 g/dL (ref 12.0–15.0)
MCH: 31 pg (ref 26.0–34.0)
MCHC: 32.3 g/dL (ref 30.0–36.0)
MCV: 95.9 fL (ref 80.0–100.0)
Platelets: 223 10*3/uL (ref 150–400)
RBC: 4.13 MIL/uL (ref 3.87–5.11)
RDW: 12.2 % (ref 11.5–15.5)
WBC: 8.5 10*3/uL (ref 4.0–10.5)
nRBC: 0 % (ref 0.0–0.2)

## 2019-08-28 LAB — I-STAT CHEM 8, ED
BUN: 8 mg/dL (ref 6–20)
Calcium, Ion: 1.15 mmol/L (ref 1.15–1.40)
Chloride: 102 mmol/L (ref 98–111)
Creatinine, Ser: 0.8 mg/dL (ref 0.44–1.00)
Glucose, Bld: 92 mg/dL (ref 70–99)
HCT: 39 % (ref 36.0–46.0)
Hemoglobin: 13.3 g/dL (ref 12.0–15.0)
Potassium: 3.7 mmol/L (ref 3.5–5.1)
Sodium: 138 mmol/L (ref 135–145)
TCO2: 28 mmol/L (ref 22–32)

## 2019-08-28 MED ORDER — SODIUM CHLORIDE 0.9 % IV BOLUS
1000.0000 mL | Freq: Once | INTRAVENOUS | Status: AC
  Administered 2019-08-28: 20:00:00 1000 mL via INTRAVENOUS

## 2019-08-28 MED ORDER — DIPHENHYDRAMINE HCL 50 MG/ML IJ SOLN
25.0000 mg | Freq: Once | INTRAMUSCULAR | Status: AC
  Administered 2019-08-28: 20:00:00 25 mg via INTRAVENOUS
  Filled 2019-08-28: qty 1

## 2019-08-28 MED ORDER — PROCHLORPERAZINE EDISYLATE 10 MG/2ML IJ SOLN
10.0000 mg | Freq: Once | INTRAMUSCULAR | Status: AC
  Administered 2019-08-28: 20:00:00 10 mg via INTRAVENOUS
  Filled 2019-08-28: qty 2

## 2019-08-28 MED ORDER — KETOROLAC TROMETHAMINE 30 MG/ML IJ SOLN
30.0000 mg | Freq: Once | INTRAMUSCULAR | Status: AC
  Administered 2019-08-28: 20:00:00 30 mg via INTRAVENOUS
  Filled 2019-08-28: qty 1

## 2019-08-28 NOTE — ED Triage Notes (Signed)
To ED for eval of cp and migraine. States she is using otc meds without relief. Pt states this has been happening since she was in 7th grade. Unable to afford the MD they want her to see. Appears in nad. Denies n/v.

## 2019-08-28 NOTE — ED Notes (Signed)
Social worker @ bedside.

## 2019-08-28 NOTE — ED Notes (Signed)
RN went to discharge pt however she was not in the room.  It appears she might have removed her IV herself as there is a IV catheter in the trash can along w/ several rags w/ blood on them.

## 2019-08-28 NOTE — ED Provider Notes (Signed)
[facility] EMERGENCY DEPARTMENT Provider Note   CSN: [id number] Arrival date & time: 08/28/19  1733     History Chief Complaint  Patient presents with  . Chest Pain  . Migraine    Heather Fox is a 24 y.o. female.  HPI Patient presents to the emergency department with a migraine headache that started 4 days ago.  The patient states that she has had migraine headaches off and on for a while.  She states that she has tried to see someone for them in the past but did not have enough money to see them.  Patient states that she took [facility] in Tylenol Extra Strength without relief of her symptoms.  Patient states that she does have some light sensitivity as well.  Patient states that she has had off-and-on chest pain for most of her life.  Patient states that nothing is new with this.  The patient denies  shortness of breath, ,blurred vision, neck pain, fever, cough, weakness, numbness, dizziness, anorexia, edema, abdominal pain, nausea, vomiting, diarrhea, rash, back pain, dysuria, hematemesis, bloody stool, near syncope, or syncope.    Past Medical History:  Diagnosis Date  . GERD (gastroesophageal reflux disease)     Patient Active Problem List   Diagnosis Date Noted  . Vitamin D deficiency 04/27/2017  . GERD (gastroesophageal reflux disease) 12/21/2014  . Rh negative state in antepartum period 11/19/2014    Past Surgical History:  Procedure Laterality Date  . NO PAST SURGERIES    . WISDOM TOOTH EXTRACTION       OB History    Gravida  3   Para  2   Term  2   Preterm      AB      Living  2     SAB      TAB      Ectopic      Multiple  0   Live Births  2           Family History  Problem Relation Age of Onset  . Stroke Mother   . Cancer Neg Hx   . Diabetes Neg Hx   . Heart disease Neg Hx   . Hypertension Neg Hx   . Hearing loss Neg Hx     Social History   Tobacco Use  . Smoking status: Former Smoker    Packs/day: 0.50    Types: Cigarettes  . Smokeless tobacco: Never Used  . Tobacco comment: quit summer 2018  Substance Use Topics  . Alcohol use: No  . Drug use: Yes    Types: Marijuana    Comment: last was last wk    Home Medications Prior to Admission medications   Medication Sig Start Date End Date Taking? Authorizing Provider  acetaminophen (TYLENOL) 500 MG tablet Take 500 mg by mouth every 6 (six) hours as needed for mild pain.   Yes [provider]  Aspirin-Salicylamide-Caffeine (BC HEADACHE POWDER PO) Take 1 packet by mouth daily as needed (For pain).    Yes [provider]  lidocaine (XYLOCAINE) 2 % solution Use as directed 15 mLs in the mouth or throat as needed for mouth pain (Do NOT swallow). Patient not taking: Reported on 08/14/2018 05/18/18   Nuala Alpha A, PA-C  traMADol (ULTRAM) 50 MG tablet Take 1 tablet (50 mg total) by mouth every 6 (six) hours as needed. Patient not taking: Reported on 08/28/2019 05/17/18   Elvera Maria, CNM    Allergies  Naproxen  Review of Systems   Review of Systems  Physical Exam Updated Vital Signs BP 115/85 (BP Location: Right Arm)   Pulse 94   Temp 98.4 F (36.9 C) (Oral)   Resp 20   SpO2 99%   Physical Exam Vitals and nursing note reviewed.  Constitutional:      General: She is not in acute distress.    Appearance: She is well-developed.  HENT:     Head: Normocephalic and atraumatic.  Eyes:     Pupils: Pupils are equal, round, and reactive to light.  Cardiovascular:     Rate and Rhythm: Normal rate and regular rhythm.     Heart sounds: Normal heart sounds. No murmur. No friction rub. No gallop.   Pulmonary:     Effort: Pulmonary effort is normal. No respiratory distress.     Breath sounds: Normal breath sounds. No wheezing.  Abdominal:     General: Bowel sounds are normal. There is no distension.     Palpations: Abdomen is soft.     Tenderness: There is no abdominal tenderness.  Musculoskeletal:      Cervical back: Normal range of motion and neck supple.  Skin:    General: Skin is warm and dry.     Capillary Refill: Capillary refill takes less than 2 seconds.     Findings: No erythema or rash.  Neurological:     Mental Status: She is alert and oriented to person, place, and time.     Sensory: Sensation is intact. No sensory deficit.     Motor: No weakness or abnormal muscle tone.     Coordination: Coordination is intact. Coordination normal.     Gait: Gait is intact. Gait normal.  Psychiatric:        Behavior: Behavior normal.     ED Results / Procedures / Treatments   Labs (all labs ordered are listed, but only abnormal results are displayed) Labs Reviewed  CBC  I-STAT CHEM 8, ED    EKG None  Radiology No results found.  Procedures Procedures (including critical care time)  Medications Ordered in ED Medications  sodium chloride 0.9 % bolus 1,000 mL (1,000 mLs Intravenous New Bag/Given 08/28/19 1947)  ketorolac (TORADOL) 30 MG/ML injection 30 mg (30 mg Intravenous Given 08/28/19 1956)  diphenhydrAMINE (BENADRYL) injection 25 mg (25 mg Intravenous Given 08/28/19 1948)  prochlorperazine (COMPAZINE) injection 10 mg (10 mg Intravenous Given 08/28/19 1951)    ED Course  I have reviewed the triage vital signs and the nursing notes.  Pertinent labs & imaging results that were available during my care of the patient were reviewed by me and considered in my medical decision making (see chart for details).    MDM Rules/Calculators/A&P                     She was given IV fluids and medicines for her migraine headache.  The patient is feeling improved at this time.  We will have the patient follow-up with a clinic provided by her case manager and social worker.  The patient has been stable here in the emergency department.  She has not had any adverse outcomes while here.  She is feeling better and we feel like she will be discharged home shortly. Final Clinical Impression(s) / ED  Diagnoses Final diagnoses:  None    Rx / DC Orders ED Discharge Orders    None       Charlestine Night, PA-C 08/28/19 2150  Charlynne Pander, MD 08/28/19 [phone]

## 2019-08-28 NOTE — Discharge Instructions (Addendum)
Follow-up with the clinic provided by the social work team.  Return here for any worsening in your condition.  Increase your fluid intake as well.

## 2019-08-28 NOTE — Social Work (Signed)
CSW met with Pt at bedside. Provided resources for PCP/[facility]. 

## 2019-09-01 ENCOUNTER — Telehealth: Payer: Self-pay

## 2019-09-01 NOTE — Telephone Encounter (Signed)
Message received from Michel Bickers, RN CM requesting a follow up appointment for the patient at [facility].   Call placed to the patient and scheduled her with the walk in provider 09/18/2019 @ 1530.  She is planning to contact her medicaid case worker to determine the extent of her coverage and to obtain a new medicaid card.   Update provided to Beecher Mcardle , RN CM

## 2019-09-17 NOTE — Progress Notes (Signed)
Patient ID: Heather Fox, female   DOB: [birth year], 24 y.o.   MRN: [id number]   Virtual Visit via Telephone Note  I connected with Heather Fox on 09/18/19 at  3:30 PM EDT by telephone and verified that I am speaking with the correct person using two identifiers.   I discussed the limitations, risks, security and privacy concerns of performing an evaluation and management service by telephone and the availability of in person appointments. I also discussed with the patient that there may be a patient responsible charge related to this service. The patient expressed understanding and agreed to proceed.  PATIENT visit by telephone virtually in the context of Covid-19 pandemic. Patient location: My Location:  [facility] office Persons on the call:    History of Present Illness: After ED visit 08/28/2019 for HA.  HA occur every other day.  Usu tylenol takes care of the HA.  HA like this for several months.  HA usu frontal.  Not on OCP.  Not sexually active.  Periods not regular.  No N/V.  HA causes decreased appetite.  Occasional dizziness associated.  +fatigue.  No vision changes.  VS reviewed and stable.  Labs done within last year reviewed.    From ED note: HPI Patient presents to the emergency department with a migraine headache that started 4 days ago.  The patient states that she has had migraine headaches off and on for a while.  She states that she has tried to see someone for them in the past but did not have enough money to see them.  Patient states that she took [facility] in Tylenol Extra Strength without relief of her symptoms.  Patient states that she does have some light sensitivity as well.  Patient states that she has had off-and-on chest pain for most of her life.  Patient states that nothing is new with this.  The patient denies  shortness of breath, ,blurred vision, neck pain, fever, cough, weakness, numbness, dizziness, anorexia, edema, abdominal pain, nausea, vomiting, diarrhea,  rash, back pain, dysuria, hematemesis, bloody stool, near syncope, or syncope.   Observations/Objective:  NAD   Assessment and Plan: 1. Other fatigue self care, proper sleep, nutrition and exercise - TSH; Future - Vitamin D, 25-hydroxy; Future  2. Chronic nonintractable headache, unspecified headache type For now, take tylenol since that is working, watch for caffiene, nicotene, dehydration, or other possible exacerbating factors.  To ED if any warning s/sx.   - TSH; Future - Vitamin D, 25-hydroxy; Future  3. Irregular periods Not SA,  Keep menstrual diary - TSH; Future  4. Hospital follow-up   Follow Up Instructions: Assign PCP 3-4 weeks   I discussed the assessment and treatment plan with the patient. The patient was provided an opportunity to ask questions and all were answered. The patient agreed with the plan and demonstrated an understanding of the instructions.   The patient was advised to call back or seek an in-person evaluation if the symptoms worsen or if the condition fails to improve as anticipated.  I provided 12 minutes of non-face-to-face time during this encounter.   Georgian Co, PA-C

## 2019-09-18 ENCOUNTER — Ambulatory Visit: Payer: Self-pay | Attending: Family Medicine | Admitting: Physician Assistant

## 2019-09-18 ENCOUNTER — Other Ambulatory Visit: Payer: Self-pay

## 2019-09-18 DIAGNOSIS — R519 Headache, unspecified: Secondary | ICD-10-CM

## 2019-09-18 DIAGNOSIS — R5383 Other fatigue: Secondary | ICD-10-CM

## 2019-09-18 DIAGNOSIS — Z09 Encounter for follow-up examination after completed treatment for conditions other than malignant neoplasm: Secondary | ICD-10-CM

## 2019-09-18 DIAGNOSIS — N926 Irregular menstruation, unspecified: Secondary | ICD-10-CM

## 2019-09-18 DIAGNOSIS — G8929 Other chronic pain: Secondary | ICD-10-CM

## 2019-09-19 ENCOUNTER — Other Ambulatory Visit: Payer: Self-pay | Admitting: Physician Assistant

## 2019-09-19 DIAGNOSIS — R7989 Other specified abnormal findings of blood chemistry: Secondary | ICD-10-CM

## 2019-09-19 LAB — TSH: TSH: 0.412 u[IU]/mL — ABNORMAL LOW (ref 0.450–4.500)

## 2019-09-19 LAB — VITAMIN D 25 HYDROXY (VIT D DEFICIENCY, FRACTURES): Vit D, 25-Hydroxy: 18.9 ng/mL — ABNORMAL LOW (ref 30.0–100.0)

## 2019-09-19 MED ORDER — VITAMIN D (ERGOCALCIFEROL) 1.25 MG (50000 UNIT) PO CAPS: 50000.0000 [IU] | ORAL_CAPSULE | ORAL | 3 refills | Status: DC

## 2019-09-19 MED FILL — VIT D2 1.25 MG (50,000 UNIT: 1.25 MG | 84 days supply | Qty: 12 | Fill #0

## 2019-09-23 ENCOUNTER — Ambulatory Visit: Payer: Self-pay | Attending: Family Medicine

## 2019-09-23 ENCOUNTER — Other Ambulatory Visit: Payer: Self-pay

## 2019-09-23 DIAGNOSIS — R7989 Other specified abnormal findings of blood chemistry: Secondary | ICD-10-CM

## 2019-09-24 LAB — TSH+T4F+T3FREE
Free T4: 1.21 ng/dL (ref 0.82–1.77)
T3, Free: 3.2 pg/mL (ref 2.0–4.4)
TSH: 1.04 u[IU]/mL (ref 0.450–4.500)

## 2019-09-25 ENCOUNTER — Encounter: Payer: Self-pay | Admitting: *Deleted

## 2019-10-22 ENCOUNTER — Ambulatory Visit: Payer: Medicaid Other | Admitting: Family Medicine

## 2019-12-28 ENCOUNTER — Ambulatory Visit (HOSPITAL_COMMUNITY)
Admission: EM | Admit: 2019-12-28 | Discharge: 2019-12-28 | Disposition: A | Discharge status: Home / Self Care | Payer: Medicaid Other | Attending: Emergency Medicine | Admitting: Emergency Medicine

## 2019-12-28 ENCOUNTER — Encounter (HOSPITAL_COMMUNITY): Payer: Self-pay

## 2019-12-28 ENCOUNTER — Other Ambulatory Visit: Payer: Self-pay

## 2019-12-28 DIAGNOSIS — Z20822 Contact with and (suspected) exposure to covid-19: Secondary | ICD-10-CM | POA: Insufficient documentation

## 2019-12-28 DIAGNOSIS — R0981 Nasal congestion: Secondary | ICD-10-CM | POA: Diagnosis not present

## 2019-12-28 DIAGNOSIS — K219 Gastro-esophageal reflux disease without esophagitis: Secondary | ICD-10-CM | POA: Insufficient documentation

## 2019-12-28 DIAGNOSIS — Z886 Allergy status to analgesic agent status: Secondary | ICD-10-CM | POA: Insufficient documentation

## 2019-12-28 DIAGNOSIS — F17210 Nicotine dependence, cigarettes, uncomplicated: Secondary | ICD-10-CM | POA: Insufficient documentation

## 2019-12-28 DIAGNOSIS — E559 Vitamin D deficiency, unspecified: Secondary | ICD-10-CM | POA: Insufficient documentation

## 2019-12-28 NOTE — ED Provider Notes (Signed)
[facility]    CSN: [id number] Arrival date & time: 12/28/19  1503      History   Chief Complaint Chief Complaint  Patient presents with   Covid Testing    HPI Kenzey RANDI POULLARD is a 24 y.o. female.   Camdynn SHELITA STEPTOE presents with complaints of nasal drainage for the past 3 days. No cough. No fevers. No headache. Mild body aches. No gi symptoms. No known ill contacts although son who is here also, has been ill. No covid exposures. No history of covid-19 and has not received vaccination.    ROS per HPI, negative if not otherwise mentioned.      Past Medical History:  Diagnosis Date   GERD (gastroesophageal reflux disease)     Patient Active Problem List   Diagnosis Date Noted   Vitamin D deficiency 04/27/2017   GERD (gastroesophageal reflux disease) 12/21/2014   Rh negative state in antepartum period 11/19/2014    Past Surgical History:  Procedure Laterality Date   NO PAST SURGERIES     WISDOM TOOTH EXTRACTION      OB History    Gravida  3   Para  2   Term  2   Preterm      AB      Living  2     SAB      TAB      Ectopic      Multiple  0   Live Births  2            Home Medications    Prior to Admission medications   Medication Sig Start Date End Date Taking? Authorizing Provider  acetaminophen (TYLENOL) 500 MG tablet Take 500 mg by mouth every 6 (six) hours as needed for mild pain.    [provider]  Aspirin-Salicylamide-Caffeine (BC HEADACHE POWDER PO) Take 1 packet by mouth daily as needed (For pain).     [provider]  lidocaine (XYLOCAINE) 2 % solution Use as directed 15 mLs in the mouth or throat as needed for mouth pain (Do NOT swallow). Patient not taking: Reported on 08/14/2018 05/18/18   Harlene Salts A, PA-C  traMADol (ULTRAM) 50 MG tablet Take 1 tablet (50 mg total) by mouth every 6 (six) hours as needed. Patient not taking: Reported on 08/28/2019 05/17/18   Sharen Counter  A, CNM  Vitamin D, Ergocalciferol, (DRISDOL) 1.25 MG (50000 UNIT) CAPS capsule Take 1 capsule (50,000 Units total) by mouth every 7 (seven) days. 09/19/19   Anders Simmonds, PA-C    Family History Family History  Problem Relation Age of Onset   Stroke Mother    Cancer Neg Hx    Diabetes Neg Hx    Heart disease Neg Hx    Hypertension Neg Hx    Hearing loss Neg Hx     Social History Social History   Tobacco Use   Smoking status: Current Every Day Smoker    Packs/day: 0.50    Types: Cigarettes   Smokeless tobacco: Never Used   Tobacco comment: quit summer 2018  Vaping Use   Vaping Use: Never used  Substance Use Topics   Alcohol use: No   Drug use: Yes    Types: Marijuana    Comment: last was last wk     Allergies   Naproxen   Review of Systems Review of Systems   Physical Exam Triage Vital Signs ED Triage Vitals  Enc Vitals Group     BP  12/28/19 1717 119/84     Pulse Rate 12/28/19 1717 84     Resp 12/28/19 1717 18     Temp 12/28/19 1717 98.4 F (36.9 C)     Temp Source 12/28/19 1717 Oral     SpO2 12/28/19 1717 100 %     Weight --      Height --      Head Circumference --      Peak Flow --      Pain Score 12/28/19 1724 0     Pain Loc --      Pain Edu? --      Excl. in [place]? --    No data found.  Updated Vital Signs BP 119/84 (BP Location: Right Arm)    Pulse 84    Temp 98.4 F (36.9 C) (Oral)    Resp 18    LMP  (LMP Unknown)    SpO2 100%   Visual Acuity Right Eye Distance:   Left Eye Distance:   Bilateral Distance:    Right Eye Near:   Left Eye Near:    Bilateral Near:     Physical Exam Constitutional:      General: She is not in acute distress.    Appearance: She is well-developed.  Cardiovascular:     Rate and Rhythm: Normal rate.  Pulmonary:     Effort: Pulmonary effort is normal.  Skin:    General: Skin is warm and dry.  Neurological:     Mental Status: She is alert and oriented to person, place, and time.      UC  Treatments / Results  Labs (all labs ordered are listed, but only abnormal results are displayed) Labs Reviewed  SARS CORONAVIRUS 2 (TAT 6-24 HRS)    EKG   Radiology No results found.  Procedures Procedures (including critical care time)  Medications Ordered in UC Medications - No data to display  Initial Impression / Assessment and Plan / UC Course  I have reviewed the triage vital signs and the nursing notes.  Pertinent labs & imaging results that were available during my care of the patient were reviewed by me and considered in my medical decision making (see chart for details).     Non toxic. Benign physical exam. Supportive cares recommended. covid testing collected and pending. Return precautions provided. Patient verbalized understanding and agreeable to plan.    Final Clinical Impressions(s) / UC Diagnoses   Final diagnoses:  Nasal congestion     Discharge Instructions     Self isolate until covid results are back and negative.  Will notify you by phone of any positive findings. Your negative results will be sent through your MyChart.         ED Prescriptions    None     PDMP not reviewed this encounter.   Georgetta Haber, NP 12/28/19 2159

## 2019-12-28 NOTE — ED Triage Notes (Signed)
Pt presents for covid testing after a possible exposure; pt states she has some nasal drainage.

## 2019-12-28 NOTE — Discharge Instructions (Signed)
Self isolate until covid results are back and negative.  °Will notify you by phone of any positive findings. Your negative results will be sent through your MyChart.     ° °

## 2019-12-29 LAB — SARS CORONAVIRUS 2 (TAT 6-24 HRS): SARS Coronavirus 2: NEGATIVE

## 2020-05-22 ENCOUNTER — Emergency Department (HOSPITAL_COMMUNITY): Payer: Medicaid Other

## 2020-05-22 ENCOUNTER — Emergency Department (HOSPITAL_COMMUNITY)
Admission: EM | Admit: 2020-05-22 | Discharge: 2020-05-22 | Disposition: A | Discharge status: Home / Self Care | Payer: Medicaid Other | Attending: Emergency Medicine | Admitting: Emergency Medicine

## 2020-05-22 DIAGNOSIS — F17210 Nicotine dependence, cigarettes, uncomplicated: Secondary | ICD-10-CM | POA: Insufficient documentation

## 2020-05-22 DIAGNOSIS — U071 COVID-19: Secondary | ICD-10-CM | POA: Diagnosis not present

## 2020-05-22 DIAGNOSIS — R112 Nausea with vomiting, unspecified: Secondary | ICD-10-CM | POA: Diagnosis present

## 2020-05-22 DIAGNOSIS — R519 Headache, unspecified: Secondary | ICD-10-CM

## 2020-05-22 DIAGNOSIS — Z7982 Long term (current) use of aspirin: Secondary | ICD-10-CM | POA: Insufficient documentation

## 2020-05-22 LAB — COMPREHENSIVE METABOLIC PANEL
ALT: 18 U/L (ref 0–44)
AST: 24 U/L (ref 15–41)
Albumin: 6 g/dL — ABNORMAL HIGH (ref 3.5–5.0)
Alkaline Phosphatase: 61 U/L (ref 38–126)
Anion gap: 17 — ABNORMAL HIGH (ref 5–15)
BUN: 10 mg/dL (ref 6–20)
CO2: 21 mmol/L — ABNORMAL LOW (ref 22–32)
Calcium: 10.3 mg/dL (ref 8.9–10.3)
Chloride: 102 mmol/L (ref 98–111)
Creatinine, Ser: 0.84 mg/dL (ref 0.44–1.00)
GFR, Estimated: >60 mL/min (ref >60)
Glucose, Bld: 80 mg/dL (ref 70–99)
Potassium: 3.7 mmol/L (ref 3.5–5.1)
Sodium: 140 mmol/L (ref 135–145)
Total Bilirubin: 0.7 mg/dL (ref 0.3–1.2)
Total Protein: 9.5 g/dL — ABNORMAL HIGH (ref 6.5–8.1)

## 2020-05-22 LAB — CBC WITH DIFFERENTIAL/PLATELET
Abs Immature Granulocytes: 0.04 10*3/uL (ref 0.00–0.07)
Basophils Absolute: 0 10*3/uL (ref 0.0–0.1)
Basophils Relative: 0 %
Eosinophils Absolute: 0 10*3/uL (ref 0.0–0.5)
Eosinophils Relative: 0 %
HCT: 46.6 % — ABNORMAL HIGH (ref 36.0–46.0)
Hemoglobin: 15.4 g/dL — ABNORMAL HIGH (ref 12.0–15.0)
Immature Granulocytes: 0 %
Lymphocytes Relative: 3 %
Lymphs Abs: 0.3 10*3/uL — ABNORMAL LOW (ref 0.7–4.0)
MCH: 31.4 pg (ref 26.0–34.0)
MCHC: 33 g/dL (ref 30.0–36.0)
MCV: 95.1 fL (ref 80.0–100.0)
Monocytes Absolute: 0.8 10*3/uL (ref 0.1–1.0)
Monocytes Relative: 7 %
Neutro Abs: 9.4 10*3/uL — ABNORMAL HIGH (ref 1.7–7.7)
Neutrophils Relative %: 90 %
Platelets: 214 10*3/uL (ref 150–400)
RBC: 4.9 MIL/uL (ref 3.87–5.11)
RDW: 12.5 % (ref 11.5–15.5)
WBC: 10.5 10*3/uL (ref 4.0–10.5)
nRBC: 0 % (ref 0.0–0.2)

## 2020-05-22 LAB — POC SARS CORONAVIRUS 2 AG -  ED: SARS Coronavirus 2 Ag: POSITIVE — AB

## 2020-05-22 LAB — TROPONIN I (HIGH SENSITIVITY): Troponin I (High Sensitivity): <2 ng/L (ref <18)

## 2020-05-22 LAB — I-STAT BETA HCG BLOOD, ED (MC, WL, AP ONLY): I-stat hCG, quantitative: <5 m[IU]/mL (ref <5)

## 2020-05-22 MED ORDER — SODIUM CHLORIDE 0.9 % IV BOLUS
1000.0000 mL | Freq: Once | INTRAVENOUS | Status: AC
  Administered 2020-05-22: 1000 mL via INTRAVENOUS

## 2020-05-22 MED ORDER — DIPHENHYDRAMINE HCL 50 MG/ML IJ SOLN
50.0000 mg | Freq: Once | INTRAMUSCULAR | Status: AC
  Administered 2020-05-22: 50 mg via INTRAVENOUS
  Filled 2020-05-22: qty 1

## 2020-05-22 MED ORDER — ONDANSETRON 4 MG PO TBDP: 4.0000 mg | ORAL_TABLET | Freq: Three times a day (TID) | ORAL | 0 refills | Status: DC | PRN

## 2020-05-22 MED ORDER — ACETAMINOPHEN 500 MG PO TABS
1000.0000 mg | ORAL_TABLET | Freq: Once | ORAL | Status: AC
  Administered 2020-05-22: 1000 mg via ORAL
  Filled 2020-05-22: qty 2

## 2020-05-22 MED ORDER — SODIUM CHLORIDE 0.9 % IV BOLUS
500.0000 mL | Freq: Once | INTRAVENOUS | Status: AC
  Administered 2020-05-22: 500 mL via INTRAVENOUS

## 2020-05-22 MED ORDER — ONDANSETRON HCL 4 MG/2ML IJ SOLN
4.0000 mg | Freq: Once | INTRAMUSCULAR | Status: AC
  Administered 2020-05-22: 4 mg via INTRAVENOUS
  Filled 2020-05-22: qty 2

## 2020-05-22 MED ORDER — PROCHLORPERAZINE EDISYLATE 10 MG/2ML IJ SOLN
10.0000 mg | Freq: Once | INTRAMUSCULAR | Status: AC
  Administered 2020-05-22: 10 mg via INTRAVENOUS
  Filled 2020-05-22: qty 2

## 2020-05-22 NOTE — ED Notes (Signed)
Informed provider that Covid POC is positive.

## 2020-05-22 NOTE — ED Notes (Signed)
Patient ambulated a few feet in her room. O2 sats remained at 100% but patient stopped due to pain.

## 2020-05-22 NOTE — ED Notes (Signed)
D/C instruction given to the pt teach back completed, belongings with pt.

## 2020-05-22 NOTE — ED Notes (Signed)
Patient offered food and beverage for PO challenge.

## 2020-05-22 NOTE — ED Triage Notes (Signed)
Per EMS, pt from home complains of shortness of breath, chest/back pain, worse with palpation. Daughter was diagnosed with COVID, patient has not been tested yet.   HR 110 O2 100 on RA BP 120/80

## 2020-05-22 NOTE — ED Provider Notes (Signed)
[facility] DEPT Provider Note   CSN: [id number] Arrival date & time: 05/22/20  1433     History Chief Complaint  Patient presents with  . Shortness of Breath    Heather Fox is a 25 y.o. female.  HPI   Patient is a 25 year old female with a medical history as noted below.  She presents today due to sudden onset nausea and vomiting that started this morning.  She reports associated diarrhea, intractable nausea and vomiting, fatigue, weakness, chronic chest pain, mild shortness of breath, rhinorrhea.  Also reports a frontal/bitemporal headache.  She has not been vaccinated for COVID-19.  Covid POC obtained in triage and is positive. No cough, sore throat, abdominal pain, dysuria, discharge.     Past Medical History:  Diagnosis Date  . GERD (gastroesophageal reflux disease)     Patient Active Problem List   Diagnosis Date Noted  . Vitamin D deficiency 04/27/2017  . GERD (gastroesophageal reflux disease) 12/21/2014  . Rh negative state in antepartum period 11/19/2014    Past Surgical History:  Procedure Laterality Date  . NO PAST SURGERIES    . WISDOM TOOTH EXTRACTION       OB History    Gravida  3   Para  2   Term  2   Preterm      AB      Living  2     SAB      IAB      Ectopic      Multiple  0   Live Births  2           Family History  Problem Relation Age of Onset  . Stroke Mother   . Cancer Neg Hx   . Diabetes Neg Hx   . Heart disease Neg Hx   . Hypertension Neg Hx   . Hearing loss Neg Hx     Social History   Tobacco Use  . Smoking status: Current Every Day Smoker    Packs/day: 0.50    Types: Cigarettes  . Smokeless tobacco: Never Used  . Tobacco comment: quit summer 2018  Vaping Use  . Vaping Use: Never used  Substance Use Topics  . Alcohol use: No  . Drug use: Yes    Types: Marijuana    Comment: last was last wk    Home Medications Prior to Admission medications   Medication Sig  Start Date End Date Taking? Authorizing Provider  acetaminophen (TYLENOL) 500 MG tablet Take 500 mg by mouth every 6 (six) hours as needed for mild pain.    [provider]  Aspirin-Salicylamide-Caffeine (BC HEADACHE POWDER PO) Take 1 packet by mouth daily as needed (For pain).     [provider]  lidocaine (XYLOCAINE) 2 % solution Use as directed 15 mLs in the mouth or throat as needed for mouth pain (Do NOT swallow). Patient not taking: Reported on 08/14/2018 05/18/18   Nuala Alpha A, PA-C  traMADol (ULTRAM) 50 MG tablet Take 1 tablet (50 mg total) by mouth every 6 (six) hours as needed. Patient not taking: Reported on 08/28/2019 05/17/18   Fatima Blank A, CNM  Vitamin D, Ergocalciferol, (DRISDOL) 1.25 MG (50000 UNIT) CAPS capsule Take 1 capsule (50,000 Units total) by mouth every 7 (seven) days. 09/19/19   Argentina Donovan, PA-C    Allergies    Naproxen  Review of Systems   Review of Systems  All other systems reviewed and are negative. Ten systems reviewed  and are negative for acute change, except as noted in the HPI.    Physical Exam Updated Vital Signs BP 112/65 (BP Location: Left Arm)   Pulse (!) 106   Temp 100.2 F (37.9 C) (Oral)   Resp 20   SpO2 100%   Physical Exam Vitals and nursing note reviewed.  Constitutional:      General: She is not in acute distress.    Appearance: Normal appearance. She is well-developed and normal weight. She is not ill-appearing, toxic-appearing or diaphoretic.  HENT:     Head: Normocephalic and atraumatic.     Right Ear: External ear normal.     Left Ear: External ear normal.     Nose: Nose normal.     Mouth/Throat:     Mouth: Mucous membranes are moist.     Pharynx: Oropharynx is clear. No oropharyngeal exudate or posterior oropharyngeal erythema.  Eyes:     Extraocular Movements: Extraocular movements intact.     Pupils: Pupils are equal, round, and reactive to light.  Cardiovascular:     Rate and  Rhythm: Regular rhythm. Tachycardia present.     Pulses: Normal pulses.     Heart sounds: Normal heart sounds. No murmur heard. No friction rub. No gallop.   Pulmonary:     Effort: Pulmonary effort is normal. No tachypnea, bradypnea, accessory muscle usage or respiratory distress.     Breath sounds: Normal breath sounds. No stridor. No decreased breath sounds, wheezing, rhonchi or rales.  Abdominal:     General: Abdomen is flat.     Palpations: Abdomen is soft.     Tenderness: There is no abdominal tenderness.  Musculoskeletal:        General: Normal range of motion.     Cervical back: Normal range of motion and neck supple. No tenderness.     Right lower leg: No edema.     Left lower leg: No edema.  Skin:    General: Skin is warm and dry.  Neurological:     General: No focal deficit present.     Mental Status: She is alert and oriented to person, place, and time.  Psychiatric:        Mood and Affect: Mood normal.        Behavior: Behavior normal.    ED Results / Procedures / Treatments   Labs (all labs ordered are listed, but only abnormal results are displayed) Labs Reviewed  COMPREHENSIVE METABOLIC PANEL - Abnormal; Notable for the following components:      Result Value   CO2 21 (*)    Total Protein 9.5 (*)    Albumin 6.0 (*)    Anion gap 17 (*)    All other components within normal limits  CBC WITH DIFFERENTIAL/PLATELET - Abnormal; Notable for the following components:   Hemoglobin 15.4 (*)    HCT 46.6 (*)    Neutro Abs 9.4 (*)    Lymphs Abs 0.3 (*)    All other components within normal limits  POC SARS CORONAVIRUS 2 AG -  ED - Abnormal; Notable for the following components:   SARS Coronavirus 2 Ag POSITIVE (*)    All other components within normal limits  I-STAT BETA HCG BLOOD, ED ([facility], [facility], [facility] ONLY)  TROPONIN I (HIGH SENSITIVITY)   EKG None  Radiology DG Chest Portable 1 View  Result Date: 05/22/2020 CLINICAL DATA:  Shortness of breath, COVID EXAM: PORTABLE  CHEST 1 VIEW COMPARISON:  12/05/2018 FINDINGS: The heart size and mediastinal contours  are within normal limits. Both lungs are clear. The visualized skeletal structures are unremarkable. IMPRESSION: Negative Electronically Signed   By: Charlett Nose M.D.   On: 05/22/2020 16:13   Procedures Procedures (including critical care time)  Medications Ordered in ED Medications  sodium chloride 0.9 % bolus 1,000 mL (1,000 mLs Intravenous New Bag/Given 05/22/20 1703)  ondansetron (ZOFRAN) injection 4 mg (4 mg Intravenous Given 05/22/20 1719)  acetaminophen (TYLENOL) tablet 1,000 mg (1,000 mg Oral Given 05/22/20 1701)  prochlorperazine (COMPAZINE) injection 10 mg (10 mg Intravenous Given 05/22/20 1938)  diphenhydrAMINE (BENADRYL) injection 50 mg (50 mg Intravenous Given 05/22/20 1938)  sodium chloride 0.9 % bolus 500 mL (500 mLs Intravenous New Bag/Given 05/22/20 1937)    ED Course  I have reviewed the triage vital signs and the nursing notes.  Pertinent labs & imaging results that were available during my care of the patient were reviewed by me and considered in my medical decision making (see chart for details).  Clinical Course as of 05/22/20 2128  Sat May 22, 2020  1709 DG Chest Portable 1 View Negative [LJ]  1709 SARS Coronavirus 2 Ag(!): POSITIVE [LJ]  1803 Troponin I (High Sensitivity): <2 [LJ]    Clinical Course User Index [LJ] Placido Sou, PA-C   MDM Rules/Calculators/A&P                          Patient is a 25 year old female who presents with a multitude of symptoms consistent with COVID-19.  They all started this morning.  Her COVID-19 test was positive in triage.  Obtain basic labs on the patient which were generally reassuring.  CBC without leukocytosis.  Elevated hemoglobin and hematocrit, which is likely secondary to hemoconcentration.  Electrolytes within normal limits on CMP.  Troponin less than 2.  No elevation in i-STAT beta-hCG.  Anion gap of 17.  Patient given 1.5 L of IV  fluids.  Chest x-ray negative.  Oxygen saturations in the high 90s throughout her stay.  No evidence of hypoxia.  Patient given Tylenol, IV fluids, as well as a dose of Zofran.  Her nausea resolved but this did little for her headache.  She was given a migraine cocktail including Benadryl as well as Compazine.  Patient reassessed once again and states she is feeling much better.  She feels comfortable being discharged at this time.  Feel patient is safe for discharge at this time.  We had a lengthy discussion regarding return precautions.  Recommended continued use of Tylenol for management of her body aches and fevers.  Discharged on a short course of Zofran.  PCP follow-up.  Patient understands to return to the ER with new or worsening symptoms.  Her questions were answered and she was amicable at the time of discharge.  Final Clinical Impression(s) / ED Diagnoses Final diagnoses:  COVID-19  Non-intractable vomiting with nausea, unspecified vomiting type  Bad headache   Rx / DC Orders ED Discharge Orders         Ordered    ondansetron (ZOFRAN ODT) 4 MG disintegrating tablet  Every 8 hours PRN        05/22/20 2126           Placido Sou, PA-C 05/22/20 2128    Charlynne Pander, MD 05/22/20 [phone]

## 2020-05-22 NOTE — Discharge Instructions (Addendum)
Like we discussed, occasional Zofran.  This will help with your nausea and vomiting.  Only take it as prescribed.  Only take it if you are experiencing nausea and vomiting that is so severe that you cannot tolerate any food or fluids.  Recommend you continue taking Tylenol for management of your body aches and fevers.  Do not exceed 3000 mg in 1 day.  Please follow the instructions on the bottle.  I would also recommend you purchase a pulse oximeter.  You can purchase these on [employer] for $10-$15.  If your saturations go down to the 80s and stay there for more than a few minutes, it is reasonable to come back to the emergency department for reevaluation.  If you develop any other new or worsening symptoms you can always return to the emergency department for reevaluation.  It was a pleasure to meet you.

## 2020-05-31 ENCOUNTER — Ambulatory Visit: Payer: Self-pay | Admitting: *Deleted

## 2020-05-31 ENCOUNTER — Other Ambulatory Visit: Payer: Self-pay

## 2020-05-31 ENCOUNTER — Encounter (HOSPITAL_COMMUNITY): Payer: Self-pay

## 2020-05-31 ENCOUNTER — Emergency Department (HOSPITAL_COMMUNITY)
Admission: EM | Admit: 2020-05-31 | Discharge: 2020-05-31 | Disposition: A | Discharge status: Home / Self Care | Payer: Medicaid Other | Attending: Emergency Medicine | Admitting: Emergency Medicine

## 2020-05-31 ENCOUNTER — Emergency Department (HOSPITAL_BASED_OUTPATIENT_CLINIC_OR_DEPARTMENT_OTHER): Payer: Medicaid Other

## 2020-05-31 ENCOUNTER — Ambulatory Visit (HOSPITAL_COMMUNITY)
Admission: EM | Admit: 2020-05-31 | Discharge: 2020-05-31 | Disposition: A | Discharge status: Home / Self Care | Payer: MEDICAID

## 2020-05-31 DIAGNOSIS — M79632 Pain in left forearm: Secondary | ICD-10-CM | POA: Diagnosis present

## 2020-05-31 DIAGNOSIS — F17210 Nicotine dependence, cigarettes, uncomplicated: Secondary | ICD-10-CM | POA: Insufficient documentation

## 2020-05-31 DIAGNOSIS — M7989 Other specified soft tissue disorders: Secondary | ICD-10-CM | POA: Diagnosis not present

## 2020-05-31 DIAGNOSIS — M79609 Pain in unspecified limb: Secondary | ICD-10-CM | POA: Diagnosis not present

## 2020-05-31 DIAGNOSIS — M79602 Pain in left arm: Secondary | ICD-10-CM

## 2020-05-31 DIAGNOSIS — Z7982 Long term (current) use of aspirin: Secondary | ICD-10-CM | POA: Diagnosis not present

## 2020-05-31 NOTE — Telephone Encounter (Signed)
Pt called in c/o her left forearm being swollen, sore and painful.   She was seen in the ED on 05/22/2020 for vomiting and was given fluids and medicine for nausea.  She read from her discharge summary it was Zofran and Compazine. She said the IV was on top of her left forearm but the vein leading from there and down into her inner forearm is sore and "feels like it's stopped up or something is in it".  It's so painful she can't stretch her arm out straight.  See notes below.  There were no appts available at [facility] with [facility] so I have referred her to the urgent care.   She was agreeable to going to the [facility] now to be seen.     Reason for Disposition . MODERATE arm swelling (e.g., puffiness or swollen feeling of entire arm)  Answer Assessment - Initial Assessment Questions 1. ONSET: "When did the swelling start?" (e.g., minutes, hours, days)     Left arm is swollen.  Started 4 days ago.  I went to the ED  On 05/22/2020 and had an IV in my wrist.   I was positive for covid. 4 days ago when I grab to do something it hurts really bad.  The vein I was stuck in is swollen and sore.   It feels like something is pulled in my wrist.  When I touch it it hurts really bad.   The pain is not where the IV was.  2. LOCATION: "What part of the arm is swollen?"  "Are both arms swollen or just one arm?"     My inner arm is swollen.    My IV was on top of my arm.   I can feel where it's swollen and sore and painful.   I think it's from the vein where the IV was the same vein.  My daughter is a Engineer, civil (consulting) and looked at it last night.   She touched you need to go to the dr.  Quincy Carnes gave me IV fluids because I was vomiting.   3. SEVERITY: "How bad is the swelling?" (e.g., localized; mild, moderate, severe)   - LOCALIZED: Small area of puffiness or swelling on just one arm   - JOINT SWELLING: Swelling of one joint   - MILD: Puffiness or swelling of hand   - MODERATE: Puffiness or  swollen feeling of entire arm    - SEVERE: All of arm looks swollen; pitting edema     Swelling in my forearm.   Anything I do with my left arm hurts.  I'm rubbing it because it hurts.   I can't stretch it out straight because it hurts. 4. REDNESS: "Does the swelling look red or infected?"     No redness.  It looks irritated.  5. PAIN: "Is the swelling painful to touch?" If Yes, ask: "How painful is it?"   (Scale 1-10; mild, moderate or severe)     7 on pain scale.  When I move it it's a 10.   I can't stretch it out.   I have small veins.  I always tell them to use a butterfly needle.   She didn't have difficulty sticking me but when she drew the blood from it before starting the IV to it it hurt.  After she finished drawing the blood and connected the IV fluid it long longer hurt. 6. FEVER: "Do you have a fever?" If Yes, ask: "What is  it, how was it measured, and when did it start?"      No 7. CAUSE: "What do you think is causing the arm swelling?"     The IV from the ED visit for vomiting on 1/1/222. 8. MEDICAL HISTORY: "Do you have a history of heart failure, kidney disease, liver failure, or cancer?"     Not asked 9. RECURRENT SYMPTOM: "Have you had arm swelling before?" If Yes, ask: "When was the last time?" "What happened that time?"     No 10. OTHER SYMPTOMS: "Do you have any other symptoms?" (e.g., chest pain, difficulty breathing)       I'm much better from the covid.    I had an appt but I missed the appt.  I was supposed to be seen [facility]. 11. PREGNANCY: "Is there any chance you are pregnant?" "When was your last menstrual period?"       Not asked  Protocols used: ARM SWELLING AND EDEMA-A-AH

## 2020-05-31 NOTE — ED Notes (Signed)
Pt left without discharge papers or discharge vitals

## 2020-05-31 NOTE — ED Triage Notes (Signed)
Pt presents with Left forearm bruising and pain after receiving an IV 1/1. Pt spoke to his PCP, they sent her here to rule out a blood clot.

## 2020-05-31 NOTE — CV Procedure (Signed)
LUE venous duplex complete. Preliminary results given to [facility], RN by Augusto Gamble, RVT/RDMS at [phone]  Results can be found under chart review under CV PROC. 05/31/2020 8:17 PM Billiejo Sorto RVT, RDMS

## 2020-05-31 NOTE — Telephone Encounter (Signed)
Patient calling back due to recent recommendation to go to UC for swelling in left forearm. [facility] and was told they do not do ultrasounds. No appt noted until 06/03/20 at clinic for PCP to evaluate left forearm swelling and patient recommended to have arm assessed today . Patient swelling in left forearm and pain reported and c/o patient reported to previous NT , patient recommended to be seen in ED. Patient verbalized understanding to go to ED and not wait to have left forearm assessed.

## 2020-05-31 NOTE — ED Provider Notes (Signed)
[facility] Provider Note   CSN: [id number] Arrival date & time: 05/31/20  1310     History Chief Complaint  Patient presents with  . Arm Pain    Heather Fox is a 25 y.o. female.  The history is provided by the patient and medical records. No language interpreter was used.  Arm Injury Location:  Arm Arm location:  L forearm Injury: no (IF stick 6 daysa go)   Pain details:    Quality:  Aching, cramping and dull   Radiates to:  Does not radiate   Severity:  Moderate   Onset quality:  Gradual   Timing:  Constant   Progression:  Unchanged Dislocation: no   Foreign body present:  No foreign bodies Tetanus status:  Unknown Relieved by:  Nothing Worsened by:  Nothing Ineffective treatments:  None tried Associated symptoms: stiffness   Associated symptoms: no back pain, no decreased range of motion, no fatigue, no fever, no muscle weakness, no neck pain, no numbness, no swelling and no tingling        Past Medical History:  Diagnosis Date  . GERD (gastroesophageal reflux disease)     Patient Active Problem List   Diagnosis Date Noted  . Vitamin D deficiency 04/27/2017  . GERD (gastroesophageal reflux disease) 12/21/2014  . Rh negative state in antepartum period 11/19/2014    Past Surgical History:  Procedure Laterality Date  . NO PAST SURGERIES    . WISDOM TOOTH EXTRACTION       OB History    Gravida  3   Para  2   Term  2   Preterm      AB      Living  2     SAB      IAB      Ectopic      Multiple  0   Live Births  2           Family History  Problem Relation Age of Onset  . Stroke Mother   . Cancer Neg Hx   . Diabetes Neg Hx   . Heart disease Neg Hx   . Hypertension Neg Hx   . Hearing loss Neg Hx     Social History   Tobacco Use  . Smoking status: Current Every Day Smoker    Packs/day: 0.50    Types: Cigarettes  . Smokeless tobacco: Never Used  . Tobacco comment: quit  summer 2018  Vaping Use  . Vaping Use: Never used  Substance Use Topics  . Alcohol use: No  . Drug use: Yes    Types: Marijuana    Comment: last was last wk    Home Medications Prior to Admission medications   Medication Sig Start Date End Date Taking? Authorizing Provider  acetaminophen (TYLENOL) 500 MG tablet Take 500 mg by mouth every 6 (six) hours as needed for mild pain.    [provider]  Aspirin-Salicylamide-Caffeine (BC HEADACHE POWDER PO) Take 1 packet by mouth daily as needed (For pain).     [provider]  lidocaine (XYLOCAINE) 2 % solution Use as directed 15 mLs in the mouth or throat as needed for mouth pain (Do NOT swallow). Patient not taking: Reported on 08/14/2018 05/18/18   Harlene SaltsMorelli, Brandon A, PA-C  ondansetron (ZOFRAN ODT) 4 MG disintegrating tablet Take 1 tablet (4 mg total) by mouth every 8 (eight) hours as needed for nausea or vomiting. 05/22/20   Placido SouJoldersma, Logan, PA-C  traMADol (ULTRAM) 50 MG tablet Take 1 tablet (50 mg total) by mouth every 6 (six) hours as needed. Patient not taking: Reported on 08/28/2019 05/17/18   Sharen Counter A, CNM  Vitamin D, Ergocalciferol, (DRISDOL) 1.25 MG (50000 UNIT) CAPS capsule Take 1 capsule (50,000 Units total) by mouth every 7 (seven) days. 09/19/19   Anders Simmonds, PA-C    Allergies    Naproxen  Review of Systems   Review of Systems  Constitutional: Negative for chills, diaphoresis, fatigue and fever.  HENT: Negative for congestion.   Respiratory: Negative for cough, chest tightness, shortness of breath and wheezing.   Cardiovascular: Negative for chest pain, palpitations and leg swelling.  Gastrointestinal: Negative for abdominal pain, constipation, diarrhea, nausea and vomiting.  Genitourinary: Negative for dysuria.  Musculoskeletal: Positive for stiffness. Negative for back pain and neck pain.  Skin: Negative for rash and wound.  Neurological: Negative for light-headedness and headaches.   Psychiatric/Behavioral: Negative for confusion.  All other systems reviewed and are negative.   Physical Exam Updated Vital Signs BP 116/69   Pulse 78   Temp 98.3 F (36.8 C) (Oral)   Resp 15   Wt 61.2 kg   SpO2 100%   BMI 22.47 kg/m   Physical Exam Vitals and nursing note reviewed.  Constitutional:      General: She is not in acute distress.    Appearance: She is well-developed and well-nourished. She is not ill-appearing, toxic-appearing or diaphoretic.  HENT:     Head: Normocephalic and atraumatic.  Eyes:     Conjunctiva/sclera: Conjunctivae normal.  Cardiovascular:     Rate and Rhythm: Normal rate and regular rhythm.     Heart sounds: No murmur heard.   Pulmonary:     Effort: Pulmonary effort is normal. No respiratory distress.     Breath sounds: Normal breath sounds. No wheezing, rhonchi or rales.  Chest:     Chest wall: No tenderness.  Abdominal:     Palpations: Abdomen is soft.     Tenderness: There is no abdominal tenderness. There is no guarding or rebound.  Musculoskeletal:        General: Tenderness present. No deformity, signs of injury or edema.       Arms:     Cervical back: Neck supple.     Right lower leg: No edema.     Left lower leg: No edema.     Comments: Tenderness and some hardness on her left radial forearm. Normal range of motion of elbow and wrist. Intact pulses. Intact sensation and strength. It is not cool or swollen. No fluctuance. No induration or erythema. No tenderness of the elbow or shoulder.  Skin:    General: Skin is warm and dry.     Capillary Refill: Capillary refill takes less than 2 seconds.     Findings: No erythema or rash.  Neurological:     General: No focal deficit present.     Mental Status: She is alert.  Psychiatric:        Mood and Affect: Mood and affect and mood normal.     ED Results / Procedures / Treatments   Labs (all labs ordered are listed, but only abnormal results are displayed) Labs Reviewed - No  data to display  EKG None  Radiology VAS [place] UPPER EXTREMITY VENOUS DUPLEX  Result Date: 05/31/2020 UPPER VENOUS STUDY  Other Indications: Pain & swelling of LUE - post IV & blood draw. Comparison Study: No prior studies Performing Technologist: Augusto Gamble  Hill  Examination Guidelines: A complete evaluation includes B-mode imaging, spectral Doppler, color Doppler, and power Doppler as needed of all accessible portions of each vessel. Bilateral testing is considered an integral part of a complete examination. Limited examinations for reoccurring indications may be performed as noted.  Right Findings: +----------+------------+---------+-----------+----------+-------+ RIGHT     CompressiblePhasicitySpontaneousPropertiesSummary +----------+------------+---------+-----------+----------+-------+ Subclavian    Full       Yes       Yes                      +----------+------------+---------+-----------+----------+-------+  Left Findings: +----------+------------+---------+-----------+----------+---------------+ LEFT      CompressiblePhasicitySpontaneousProperties    Summary     +----------+------------+---------+-----------+----------+---------------+ IJV           Full       Yes       Yes                              +----------+------------+---------+-----------+----------+---------------+ Subclavian    Full       Yes       Yes                              +----------+------------+---------+-----------+----------+---------------+ Axillary      Full       Yes       Yes                              +----------+------------+---------+-----------+----------+---------------+ Brachial      Full       Yes       Yes                              +----------+------------+---------+-----------+----------+---------------+ Radial        Full                                                  +----------+------------+---------+-----------+----------+---------------+ Ulnar          Full                                                  +----------+------------+---------+-----------+----------+---------------+ Cephalic      None       No        No               Acute (forearm) +----------+------------+---------+-----------+----------+---------------+ Basilic                                             Not visualized  +----------+------------+---------+-----------+----------+---------------+ Difficult exam due to patient habitus (small veins).  *See table(s) above for measurements and observations.    Preliminary     Procedures Procedures (including critical care time)  Medications Ordered in ED Medications - No data to display  ED Course  I have reviewed the triage vital signs and the nursing notes.  Pertinent labs & imaging results that were available during  my care of the patient were reviewed by me and considered in my medical decision making (see chart for details).    MDM Rules/Calculators/A&P                          Heather Fox is a 25 y.o. female with a past medical history significant for GERD and recent diagnosis of COVID-19 who presents at the direction of her PCP for evaluation of left arm pain and soreness to rule out DVT.  Patient reports that 6 days ago, patient was diagnosed with COVID-19 and had an IV in her left arm.  She reports that since then, she has had some soreness and hardness in her left brachial radial area forearm.  She reports it is painful but it is not red or warm.  It feels like there is a hardness in this area.  She thought it was just a bruise but her PCP told her to come get an ultrasound to make sure it is not a DVT.  She denies any more fevers, chills, chest, or cough.  She denies any chest pain or shortness of breath.  She denies any other symptoms and denies nausea, vomiting, diarrhea, lightheadedness, fatigue.  She simply is having pain in her left forearm and wants to make sure is not a DVT.  She denies any  numbness, tingling, or weakness of the extremities.  Denies any change in color or appearance.  She otherwise reports feeling normal.  On exam, patient has intact radial and ulnar pulse in the left arm.  Intact grip strength sensation and there is no tenderness in the elbow or shoulder.  She did have some tenderness in the left forearm in the radial area.  There was no numbness.  No snuffbox tenderness.  Normal wrist range of motion.  Pain with palpation or movement in this area.  Exam otherwise unremarkable, lungs clear and chest nontender.  Abdomen nontender.  Vital signs reassuring on arrival.  We had a shared decision-making conversation and will get ultrasound to rule out DVT in the left upper extremity.  If is negative, dissipate discharge home with likely muscle soreness and possible hematoma from the blood stick causing the discomfort.  There was no trauma and I low suspicion for fracture at this time.  We will hold on x-ray and will get the ultrasound low suspicion for infection at this time and patient agrees with plan of care.   Anticipate discharge if ultrasound is reassuring.  8:50 PM Patient reports that she does not want to wait for the results at this time.  She easily.  She reports she will check MyChart for the results.  I told her I will try to watch and if it is positive, we will call her back with the results and if she needs to be on anticoagulants.  If it is a superficial thrombosis, suspect she will not need blood thinners.  Patient left before she could be given paperwork.   Final Clinical Impression(s) / ED Diagnoses Final diagnoses:  Left arm pain    Rx / DC Orders ED Discharge Orders    None      Clinical Impression: 1. Left arm pain     Disposition: Discharge  Condition: Good  I have discussed the results, Dx and Tx plan with the pt(& family if present). He/she/they expressed understanding and agree(s) with the plan. Discharge instructions discussed at  great length. Strict return precautions discussed and  pt &/or family have verbalized understanding of the instructions. No further questions at time of discharge.    Discharge Medication List as of 05/31/2020  8:52 PM      [facility] [address] [id number] [place] [id number] [phone]       Graysen Depaula, Canary Brimhristopher J, MD 05/31/20 2224

## 2020-05-31 NOTE — Discharge Instructions (Signed)
You left before we could give you your paperwork or get the results of the ultrasound.  Please follow-up on MyChart for these results.  If it is positive, please follow-up with a primary doctor as you may need anticoagulation.

## 2020-06-01 ENCOUNTER — Ambulatory Visit (HOSPITAL_COMMUNITY): Payer: Medicaid Other

## 2020-09-06 ENCOUNTER — Other Ambulatory Visit: Payer: Self-pay

## 2020-09-06 ENCOUNTER — Other Ambulatory Visit (HOSPITAL_COMMUNITY)
Admission: RE | Admit: 2020-09-06 | Discharge: 2020-09-06 | Disposition: A | Discharge status: Home / Self Care | Payer: Medicaid Other | Source: Ambulatory Visit | Attending: Advanced Practice Midwife | Admitting: Advanced Practice Midwife

## 2020-09-06 ENCOUNTER — Ambulatory Visit (INDEPENDENT_AMBULATORY_CARE_PROVIDER_SITE_OTHER): Payer: Medicaid Other | Admitting: Advanced Practice Midwife

## 2020-09-06 VITALS — BP 105/70 | HR 93 | Ht 65.0 in | Wt 124.6 lb

## 2020-09-06 DIAGNOSIS — Z7185 Encounter for immunization safety counseling: Secondary | ICD-10-CM

## 2020-09-06 DIAGNOSIS — G8929 Other chronic pain: Secondary | ICD-10-CM

## 2020-09-06 DIAGNOSIS — N926 Irregular menstruation, unspecified: Secondary | ICD-10-CM | POA: Diagnosis not present

## 2020-09-06 DIAGNOSIS — Z23 Encounter for immunization: Secondary | ICD-10-CM

## 2020-09-06 DIAGNOSIS — R109 Unspecified abdominal pain: Secondary | ICD-10-CM

## 2020-09-06 DIAGNOSIS — Z01419 Encounter for gynecological examination (general) (routine) without abnormal findings: Secondary | ICD-10-CM | POA: Diagnosis not present

## 2020-09-06 NOTE — Patient Instructions (Signed)
Human Papillomavirus Quadrivalent Vaccine suspension for injection What is this medicine? HUMAN PAPILLOMAVIRUS VACCINE (HYOO muhn pap uh LOH muh vahy ruhs vak SEEN) is a vaccine. It is used to prevent infections of four types of the human papillomavirus. In women, the vaccine may lower your risk of getting cervical, vaginal, vulvar, or anal cancer and genital warts. In men, the vaccine may lower your risk of getting genital warts and anal cancer. You cannot get these diseases from the vaccine. This vaccine does not treat these diseases. This medicine may be used for other purposes; ask your health care provider or pharmacist if you have questions. COMMON BRAND NAME(S): Gardasil What should I tell my health care provider before I take this medicine? They need to know if you have any of these conditions:  fever or infection  hemophilia  HIV infection or AIDS  immune system problems  low platelet count  an unusual reaction to Human Papillomavirus Vaccine, yeast, other medicines, foods, dyes, or preservatives  pregnant or trying to get pregnant  breast-feeding How should I use this medicine? This vaccine is for injection in a muscle on your upper arm or thigh. It is given by a health care professional. You will be observed for 15 minutes after each dose. Sometimes, fainting happens after the vaccine is given. You may be asked to sit or lie down during the 15 minutes. Three doses are given. The second dose is given 2 months after the first dose. The last dose is given 4 months after the second dose. A copy of a Vaccine Information Statement will be given before each vaccination. Read this sheet carefully each time. The sheet may change frequently. Talk to your pediatrician regarding the use of this medicine in children. While this drug may be prescribed for children as young as 9 years of age for selected conditions, precautions do apply. Overdosage: If you think you have taken too much of this  medicine contact a poison control center or emergency room at once. NOTE: This medicine is only for you. Do not share this medicine with others. What if I miss a dose? All 3 doses of the vaccine should be given within 6 months. Remember to keep appointments for follow-up doses. Your health care provider will tell you when to return for the next vaccine. Ask your health care professional for advice if you are unable to keep an appointment or miss a scheduled dose. What may interact with this medicine?  other vaccines This list may not describe all possible interactions. Give your health care provider a list of all the medicines, herbs, non-prescription drugs, or dietary supplements you use. Also tell them if you smoke, drink alcohol, or use illegal drugs. Some items may interact with your medicine. What should I watch for while using this medicine? This vaccine may not fully protect everyone. Continue to have regular pelvic exams and cervical or anal cancer screenings as directed by your doctor. The Human Papillomavirus is a sexually transmitted disease. It can be passed by any kind of sexual activity that involves genital contact. The vaccine works best when given before you have any contact with the virus. Many people who have the virus do not have any signs or symptoms. Tell your doctor or health care professional if you have any reaction or unusual symptom after getting the vaccine. What side effects may I notice from receiving this medicine? Side effects that you should report to your doctor or health care professional as soon as possible:    allergic reactions like skin rash, itching or hives, swelling of the face, lips, or tongue  breathing problems  feeling faint or lightheaded, falls Side effects that usually do not require medical attention (report to your doctor or health care professional if they continue or are  bothersome):  cough  dizziness  fever  headache  nausea  redness, warmth, swelling, pain, or itching at site where injected This list may not describe all possible side effects. Call your doctor for medical advice about side effects. You may report side effects to FDA at 1-800-FDA-1088. Where should I keep my medicine? This drug is given in a hospital or clinic and will not be stored at home. NOTE: This sheet is a summary. It may not cover all possible information. If you have questions about this medicine, talk to your doctor, pharmacist, or health care provider.  2021 Elsevier/Gold Standard (2013-06-30 13:14:33)

## 2020-09-06 NOTE — Progress Notes (Signed)
GYNECOLOGY ANNUAL PREVENTATIVE CARE ENCOUNTER NOTE  History:     Heather Fox is a 25 y.o. [id number] female here for a routine annual gynecologic exam.  Current complaints: small lesions near introitus, concern they are precursor to cervical cancer. Patient endorses recently noticing these lesions after she restarted waxing a little while ago. At first she thought they were burns. She spoke with her grandmother, who told her they may be linked to cervical cancer, and encouraged patient to get them checked out. Denies abnormal vaginal bleeding, discharge, pelvic pain, problems with intercourse or other gynecologic concerns.    Patient denies history of HPV. Requests initiation of Gardasil today. One female partner, + penetrative sex. Previously used Nexplanon with OCPs for breakthrough bleeding. She states she was advised to d/c all contraception to evaluate her periods without the influence of hormones.   Patient endorses history of generalized abdominal pain. She states she had it evaluated a few years ago and was told she would be referred for more assessment but then she lost her Medicaid and lost access to care.   Gynecologic History Patient's last menstrual period was 08/31/2020 (approximate). Contraception: none Last Pap: 04/24/2017. Results were: normal with negative HPV   Obstetric History OB History  Gravida Para Term Preterm AB Living  3 2 2     2   SAB IAB Ectopic Multiple Live Births        0 2    # Outcome Date GA Lbr Len/2nd Weight Sex Delivery Anes PTL Lv  3 Gravida           2 Term 01/11/15 [redacted]w[redacted]d 15:01 / 00:32 6 lb 10.9 oz (3.03 kg) M Vag-Spont EPI  LIV  1 Term 05/21/09    F Vag-Spont  N LIV    Past Medical History:  Diagnosis Date  . GERD (gastroesophageal reflux disease)     Past Surgical History:  Procedure Laterality Date  . NO PAST SURGERIES    . WISDOM TOOTH EXTRACTION      Current Outpatient Medications on File Prior to Visit  Medication Sig  Dispense Refill  . acetaminophen (TYLENOL) 500 MG tablet Take 500 mg by mouth every 6 (six) hours as needed for mild pain.    . Aspirin-Salicylamide-Caffeine (BC HEADACHE POWDER PO) Take 1 packet by mouth daily as needed (For pain).  (Patient not taking: Reported on 09/06/2020)    . lidocaine (XYLOCAINE) 2 % solution Use as directed 15 mLs in the mouth or throat as needed for mouth pain (Do NOT swallow). (Patient not taking: Reported on 08/14/2018) 100 mL 0  . ondansetron (ZOFRAN ODT) 4 MG disintegrating tablet Take 1 tablet (4 mg total) by mouth every 8 (eight) hours as needed for nausea or vomiting. (Patient not taking: Reported on 09/06/2020) 8 tablet 0  . traMADol (ULTRAM) 50 MG tablet Take 1 tablet (50 mg total) by mouth every 6 (six) hours as needed. (Patient not taking: Reported on 08/28/2019) 10 tablet 0  . Vitamin D, Ergocalciferol, (DRISDOL) 1.25 MG (50000 UNIT) CAPS capsule Take 1 capsule (50,000 Units total) by mouth every 7 (seven) days. (Patient not taking: Reported on 09/06/2020) 5 capsule 3   No current facility-administered medications on file prior to visit.    Allergies  Allergen Reactions  . Naproxen Other (See Comments)    Swollen Throat    Social History:  reports that she has been smoking cigarettes. She has been smoking about 0.50 packs per day. She has never used smokeless  tobacco. She reports current drug use. Drug: Marijuana. She reports that she does not drink alcohol.  Family History  Problem Relation Age of Onset  . Stroke Mother   . Cancer Neg Hx   . Diabetes Neg Hx   . Heart disease Neg Hx   . Hypertension Neg Hx   . Hearing loss Neg Hx     The following portions of the patient's history were reviewed and updated as appropriate: allergies, current medications, past family history, past medical history, past social history, past surgical history and problem list.  Review of Systems Pertinent items noted in HPI and remainder of comprehensive ROS otherwise  negative.  Physical Exam:  BP 105/70   Pulse 93   Ht 5\' 5"  (1.651 m)   Wt 124 lb 9.6 oz (56.5 kg)   LMP 08/31/2020 (Approximate)   BMI 20.73 kg/m  CONSTITUTIONAL: Well-developed, well-nourished female in no acute distress.  HENT:  Normocephalic, atraumatic, External right and left ear normal.  EYES: Conjunctivae and EOM are normal. Pupils are equal, round, and reactive to light. No scleral icterus.  NECK: Normal range of motion, supple, no masses.  Normal thyroid.  SKIN: Skin is warm and dry. No rash noted. Not diaphoretic. No erythema. No pallor. MUSCULOSKELETAL: Normal range of motion. No tenderness.  No cyanosis, clubbing, or edema. NEUROLOGIC: Alert and oriented to person, place, and time. Normal reflexes, muscle tone coordination.  PSYCHIATRIC: Normal mood and affect. Normal behavior. Normal judgment and thought content. CARDIOVASCULAR: Normal heart rate noted, regular rhythm RESPIRATORY: Clear to auscultation bilaterally. Effort and breath sounds normal, no problems with respiration noted. BREASTS: Symmetric in size. No masses, tenderness, skin changes, nipple drainage, or lymphadenopathy bilaterally. Performed in the presence of a chaperone. ABDOMEN: Soft, no distention noted.  No tenderness, rebound or guarding.  PELVIC: Normal appearing external genitalia and urethral meatus; normal appearing vaginal mucosa and cervix.  Scattered pinpoint flat dark brown lesions noted, predominantly near introitus but 2 or 3 visible on labia. No abnormal discharge noted.  Pap smear obtained.   Performed in the presence of a chaperone.   Assessment and Plan:    1. Well woman exam with routine gynecological exam - Flat lesions potentially related to superficial trauma in setting of waxing. Continue to monitor - Cytology - PAP - Hepatitis B surface antigen - Hepatitis C antibody - HIV Antibody (routine testing w rflx) - RPR  2. Irregular periods - No contraception, concern for unplanned  pregnancy  3. Immunization counseling - Reviewed CDC data for impact of Gardasil based on age at initiation - HPV 9-valent vaccine,Recombinat  4. Chronic abdominal pain - Reviewed records, [facility] visit 01/05/2018 patient advised to initiate care with PCP for ongoing surveillance  Will follow up results of pap smear and manage accordingly. Routine preventative health maintenance measures emphasized. Please refer to After Visit Summary for other counseling recommendations.     Total visit time: 30 minutes. Greater than 50% of visit spent in counseling and coordination of care.  01/07/2018, MSA, MSN, CNM Certified Nurse Midwife, [facility] for [facility] 09/06/20 10:09 AM

## 2020-09-07 LAB — CYTOLOGY - PAP
Chlamydia: POSITIVE — AB
Comment: NEGATIVE
Comment: NEGATIVE
Comment: NORMAL
Diagnosis: NEGATIVE
Neisseria Gonorrhea: NEGATIVE
Trichomonas: POSITIVE — AB

## 2020-09-07 LAB — HIV ANTIBODY (ROUTINE TESTING W REFLEX): HIV Screen 4th Generation wRfx: NONREACTIVE

## 2020-09-07 LAB — HEPATITIS B SURFACE ANTIGEN: Hepatitis B Surface Ag: NEGATIVE

## 2020-09-07 LAB — HEPATITIS C ANTIBODY: Hep C Virus Ab: <0.1 s/co ratio (ref 0.0–0.9)

## 2020-09-07 LAB — RPR: RPR Ser Ql: NONREACTIVE

## 2020-09-08 ENCOUNTER — Encounter: Payer: Self-pay | Admitting: Advanced Practice Midwife

## 2020-09-08 ENCOUNTER — Other Ambulatory Visit: Payer: Self-pay | Admitting: *Deleted

## 2020-09-08 DIAGNOSIS — A599 Trichomoniasis, unspecified: Secondary | ICD-10-CM

## 2020-09-08 DIAGNOSIS — A749 Chlamydial infection, unspecified: Secondary | ICD-10-CM

## 2020-09-08 MED ORDER — METRONIDAZOLE 500 MG PO TABS: 2000.0000 mg | ORAL_TABLET | Freq: Once | ORAL | 0 refills | Status: DC

## 2020-09-08 MED ORDER — METRONIDAZOLE 500 MG PO TABS: 500.0000 mg | ORAL_TABLET | Freq: Two times a day (BID) | ORAL | 0 refills | Status: DC

## 2020-09-08 MED ORDER — DOXYCYCLINE HYCLATE 100 MG PO CAPS: 100.0000 mg | ORAL_CAPSULE | Freq: Two times a day (BID) | ORAL | 0 refills | Status: DC

## 2020-09-08 MED ORDER — FLUCONAZOLE 150 MG PO TABS: 150.0000 mg | ORAL_TABLET | Freq: Once | ORAL | 0 refills | Status: AC

## 2020-09-08 NOTE — Progress Notes (Signed)
Pt with +CH and Trich. Per provider, treatment sent per protocol.  Diflucan sent in as well.  Pt made aware of STD recommendations and need for repeat testing.  Health dept form completed and faxed.

## 2020-09-08 NOTE — Progress Notes (Signed)
Pt called to office stating she could not tolerate 2gr dose of Flagyl.  Pt vomited shortly after taking.  Per Dr Macon Large, may send Flagyl 500mg  x 7 day.  Rx ordered today.

## 2020-11-08 ENCOUNTER — Ambulatory Visit (INDEPENDENT_AMBULATORY_CARE_PROVIDER_SITE_OTHER): Payer: Medicaid Other

## 2020-11-08 ENCOUNTER — Other Ambulatory Visit: Payer: Self-pay

## 2020-11-08 VITALS — BP 109/60 | HR 72

## 2020-11-08 DIAGNOSIS — Z23 Encounter for immunization: Secondary | ICD-10-CM | POA: Diagnosis not present

## 2020-11-08 NOTE — Progress Notes (Signed)
Patient presented to the office today for her HPV vaccine# 2 injection. Given by D.Eliazar Olivar Left arm IM Side Effects: None at this time Next Injection: October 20

## 2021-02-19 ENCOUNTER — Encounter (HOSPITAL_COMMUNITY): Payer: Self-pay | Admitting: Obstetrics and Gynecology

## 2021-02-19 ENCOUNTER — Inpatient Hospital Stay (HOSPITAL_COMMUNITY)
Admission: AD | Admit: 2021-02-19 | Discharge: 2021-02-19 | Disposition: A | Discharge status: Home / Self Care | Payer: Medicaid Other | Attending: Obstetrics and Gynecology | Admitting: Obstetrics and Gynecology

## 2021-02-19 ENCOUNTER — Inpatient Hospital Stay (HOSPITAL_COMMUNITY): Payer: Medicaid Other

## 2021-02-19 ENCOUNTER — Other Ambulatory Visit: Payer: Self-pay

## 2021-02-19 DIAGNOSIS — O99331 Smoking (tobacco) complicating pregnancy, first trimester: Secondary | ICD-10-CM | POA: Insufficient documentation

## 2021-02-19 DIAGNOSIS — O208 Other hemorrhage in early pregnancy: Secondary | ICD-10-CM | POA: Diagnosis not present

## 2021-02-19 DIAGNOSIS — Z3491 Encounter for supervision of normal pregnancy, unspecified, first trimester: Secondary | ICD-10-CM

## 2021-02-19 DIAGNOSIS — O468X1 Other antepartum hemorrhage, first trimester: Secondary | ICD-10-CM

## 2021-02-19 DIAGNOSIS — A599 Trichomoniasis, unspecified: Secondary | ICD-10-CM | POA: Insufficient documentation

## 2021-02-19 DIAGNOSIS — A5901 Trichomonal vulvovaginitis: Secondary | ICD-10-CM | POA: Diagnosis not present

## 2021-02-19 DIAGNOSIS — R109 Unspecified abdominal pain: Secondary | ICD-10-CM | POA: Insufficient documentation

## 2021-02-19 DIAGNOSIS — F17210 Nicotine dependence, cigarettes, uncomplicated: Secondary | ICD-10-CM | POA: Diagnosis not present

## 2021-02-19 DIAGNOSIS — Z886 Allergy status to analgesic agent status: Secondary | ICD-10-CM | POA: Diagnosis not present

## 2021-02-19 DIAGNOSIS — O418X10 Other specified disorders of amniotic fluid and membranes, first trimester, not applicable or unspecified: Secondary | ICD-10-CM

## 2021-02-19 DIAGNOSIS — O26899 Other specified pregnancy related conditions, unspecified trimester: Secondary | ICD-10-CM

## 2021-02-19 DIAGNOSIS — O26891 Other specified pregnancy related conditions, first trimester: Secondary | ICD-10-CM | POA: Insufficient documentation

## 2021-02-19 DIAGNOSIS — O23591 Infection of other part of genital tract in pregnancy, first trimester: Secondary | ICD-10-CM

## 2021-02-19 DIAGNOSIS — Z3A01 Less than 8 weeks gestation of pregnancy: Secondary | ICD-10-CM | POA: Diagnosis not present

## 2021-02-19 DIAGNOSIS — O98311 Other infections with a predominantly sexual mode of transmission complicating pregnancy, first trimester: Secondary | ICD-10-CM | POA: Insufficient documentation

## 2021-02-19 LAB — URINALYSIS, ROUTINE W REFLEX MICROSCOPIC
Bilirubin Urine: NEGATIVE
Glucose, UA: NEGATIVE mg/dL
Hgb urine dipstick: NEGATIVE
Ketones, ur: NEGATIVE mg/dL
Nitrite: NEGATIVE
Protein, ur: NEGATIVE mg/dL
Specific Gravity, Urine: 1.024 (ref 1.005–1.030)
pH: 6 (ref 5.0–8.0)

## 2021-02-19 LAB — HCG, QUANTITATIVE, PREGNANCY: hCG, Beta Chain, Quant, S: 22718 m[IU]/mL — ABNORMAL HIGH (ref <5)

## 2021-02-19 LAB — WET PREP, GENITAL
Sperm: NONE SEEN
Yeast Wet Prep HPF POC: NONE SEEN

## 2021-02-19 LAB — CBC
HCT: 39.7 % (ref 36.0–46.0)
Hemoglobin: 13 g/dL (ref 12.0–15.0)
MCH: 31.2 pg (ref 26.0–34.0)
MCHC: 32.7 g/dL (ref 30.0–36.0)
MCV: 95.2 fL (ref 80.0–100.0)
Platelets: 230 10*3/uL (ref 150–400)
RBC: 4.17 MIL/uL (ref 3.87–5.11)
RDW: 12.4 % (ref 11.5–15.5)
WBC: 10.9 10*3/uL — ABNORMAL HIGH (ref 4.0–10.5)
nRBC: 0 % (ref 0.0–0.2)

## 2021-02-19 LAB — POCT PREGNANCY, URINE: Preg Test, Ur: POSITIVE — AB

## 2021-02-19 MED ORDER — ONDANSETRON 4 MG PO TBDP
8.0000 mg | ORAL_TABLET | Freq: Once | ORAL | Status: AC
  Administered 2021-02-19: 8 mg via ORAL
  Filled 2021-02-19: qty 2

## 2021-02-19 MED ORDER — METRONIDAZOLE 500 MG PO TABS
2000.0000 mg | ORAL_TABLET | Freq: Once | ORAL | Status: AC
  Administered 2021-02-19: 2000 mg via ORAL
  Filled 2021-02-19: qty 4

## 2021-02-19 NOTE — Discharge Instructions (Signed)

## 2021-02-19 NOTE — MAU Provider Note (Signed)
History     CSN: [id number]  Arrival date and time: 02/19/21 1437   Event Date/Time   First Provider Initiated Contact with Patient 02/19/21 1525      Chief Complaint  Patient presents with   Abdominal Pain   HPI Heather Fox is a 25 y.o. [id number] at [redacted]w[redacted]d who presents with lower abdominal cramping. She reports this has been going on for a couple of weeks and she rates it a 6/10. She has not tried any medication for the pain. She had not taken a pregnancy test before coming to MAU but her period was late. She denies any abnormal bleeding or discharge.   OB History     Gravida  4   Para  2   Term  2   Preterm      AB      Living  2      SAB      IAB      Ectopic      Multiple  0   Live Births  2           Past Medical History:  Diagnosis Date   GERD (gastroesophageal reflux disease)     Past Surgical History:  Procedure Laterality Date   NO PAST SURGERIES     WISDOM TOOTH EXTRACTION      Family History  Problem Relation Age of Onset   Stroke Mother    Cancer Neg Hx    Diabetes Neg Hx    Heart disease Neg Hx    Hypertension Neg Hx    Hearing loss Neg Hx     Social History   Tobacco Use   Smoking status: Every Day    Packs/day: 0.50    Types: Cigarettes   Smokeless tobacco: Never   Tobacco comments:    quit summer 2018  Vaping Use   Vaping Use: Never used  Substance Use Topics   Alcohol use: No   Drug use: Yes    Types: Marijuana    Comment: last was last wk    Allergies:  Allergies  Allergen Reactions   Naproxen Other (See Comments)    Swollen Throat    Medications Prior to Admission  Medication Sig Dispense Refill Last Dose   acetaminophen (TYLENOL) 500 MG tablet Take 500 mg by mouth every 6 (six) hours as needed for mild pain.      Aspirin-Salicylamide-Caffeine (BC HEADACHE POWDER PO) Take 1 packet by mouth daily as needed (For pain).  (Patient not taking: Reported on 09/06/2020)      doxycycline (VIBRAMYCIN) 100  MG capsule Take 1 capsule (100 mg total) by mouth 2 (two) times daily. 14 capsule 0    lidocaine (XYLOCAINE) 2 % solution Use as directed 15 mLs in the mouth or throat as needed for mouth pain (Do NOT swallow). (Patient not taking: Reported on 08/14/2018) 100 mL 0    metroNIDAZOLE (FLAGYL) 500 MG tablet Take 1 tablet (500 mg total) by mouth 2 (two) times daily. 14 tablet 0    ondansetron (ZOFRAN ODT) 4 MG disintegrating tablet Take 1 tablet (4 mg total) by mouth every 8 (eight) hours as needed for nausea or vomiting. (Patient not taking: Reported on 09/06/2020) 8 tablet 0    traMADol (ULTRAM) 50 MG tablet Take 1 tablet (50 mg total) by mouth every 6 (six) hours as needed. (Patient not taking: Reported on 08/28/2019) 10 tablet 0    Vitamin D, Ergocalciferol, (DRISDOL) 1.25 MG (50000 UNIT) CAPS capsule  Take 1 capsule (50,000 Units total) by mouth every 7 (seven) days. (Patient not taking: Reported on 09/06/2020) 5 capsule 3     Review of Systems  Constitutional: Negative.  Negative for fatigue and fever.  HENT: Negative.    Respiratory: Negative.  Negative for shortness of breath.   Cardiovascular: Negative.  Negative for chest pain.  Gastrointestinal: Negative.  Negative for abdominal pain, constipation, diarrhea, nausea and vomiting.  Genitourinary: Negative.  Negative for dysuria, vaginal bleeding, vaginal discharge and vaginal pain.  Neurological: Negative.  Negative for dizziness and headaches.  Physical Exam   Blood pressure 122/78, pulse (!) 106, temperature 98.9 F (37.2 C), temperature source Oral, resp. rate 18, last menstrual period 01/09/2021, SpO2 99 %, unknown if currently breastfeeding.  Physical Exam Vitals and nursing note reviewed.  Constitutional:      General: She is not in acute distress.    Appearance: She is well-developed.  HENT:     Head: Normocephalic.  Eyes:     Pupils: Pupils are equal, round, and reactive to light.  Cardiovascular:     Rate and Rhythm: Normal rate  and regular rhythm.     Heart sounds: Normal heart sounds.  Pulmonary:     Effort: Pulmonary effort is normal. No respiratory distress.     Breath sounds: Normal breath sounds.  Abdominal:     General: Bowel sounds are normal. There is no distension.     Palpations: Abdomen is soft.     Tenderness: There is no abdominal tenderness.  Skin:    General: Skin is warm and dry.  Neurological:     Mental Status: She is alert and oriented to person, place, and time.  Psychiatric:        Mood and Affect: Mood normal.        Behavior: Behavior normal.        Thought Content: Thought content normal.        Judgment: Judgment normal.    MAU Course  Procedures Results for orders placed or performed during the hospital encounter of 02/19/21 (from the past 24 hour(s))  Pregnancy, urine POC     Status: Abnormal   Collection Time: 02/19/21  2:47 PM  Result Value Ref Range   Preg Test, Ur POSITIVE (A) NEGATIVE  Urinalysis, Routine w reflex microscopic Urine, Clean Catch     Status: Abnormal   Collection Time: 02/19/21  3:01 PM  Result Value Ref Range   Color, Urine YELLOW YELLOW   APPearance HAZY (A) CLEAR   Specific Gravity, Urine 1.024 1.005 - 1.030   pH 6.0 5.0 - 8.0   Glucose, UA NEGATIVE NEGATIVE mg/dL   Hgb urine dipstick NEGATIVE NEGATIVE   Bilirubin Urine NEGATIVE NEGATIVE   Ketones, ur NEGATIVE NEGATIVE mg/dL   Protein, ur NEGATIVE NEGATIVE mg/dL   Nitrite NEGATIVE NEGATIVE   Leukocytes,Ua SMALL (A) NEGATIVE   RBC / HPF 0-5 0 - 5 RBC/hpf   WBC, UA 11-20 0 - 5 WBC/hpf   Bacteria, UA RARE (A) NONE SEEN   Squamous Epithelial / LPF 6-10 0 - 5   Mucus PRESENT   CBC     Status: Abnormal   Collection Time: 02/19/21  3:12 PM  Result Value Ref Range   WBC 10.9 (H) 4.0 - 10.5 K/uL   RBC 4.17 3.87 - 5.11 MIL/uL   Hemoglobin 13.0 12.0 - 15.0 g/dL   HCT 37.8 58.8 - 50.2 %   MCV 95.2 80.0 - 100.0 fL   MCH 31.2  26.0 - 34.0 pg   MCHC 32.7 30.0 - 36.0 g/dL   RDW 59.7 41.6 - 38.4 %    Platelets 230 150 - 400 K/uL   nRBC 0.0 0.0 - 0.2 %  hCG, quantitative, pregnancy     Status: Abnormal   Collection Time: 02/19/21  3:12 PM  Result Value Ref Range   hCG, Beta Chain, Quant, S 22,718 (H) <5 mIU/mL  Wet prep, genital     Status: Abnormal   Collection Time: 02/19/21  4:08 PM  Result Value Ref Range   Yeast Wet Prep HPF POC NONE SEEN NONE SEEN   Trich, Wet Prep PRESENT (A) NONE SEEN   Clue Cells Wet Prep HPF POC PRESENT (A) NONE SEEN   WBC, Wet Prep HPF POC MANY (A) NONE SEEN   Sperm NONE SEEN     US OB LESS THAN 14 WEEKS WITH OB TRANSVAGINAL  Result Date: 02/19/2021 CLINICAL DATA:  Cramping and pain.  Pending beta HCG. EXAM: OBSTETRIC <14 WK [place] AND TRANSVAGINAL OB US TECHNIQUE: Both transabdominal and transvaginal ultrasound examinations were performed for complete evaluation of the gestation as well as the maternal uterus, adnexal regions, and pelvic cul-de-sac. Transvaginal technique was performed to assess early pregnancy. COMPARISON:  None. FINDINGS: Intrauterine gestational sac: Single Yolk sac:  Visualized. Embryo:  Visualized. Cardiac Activity: Not Visualized. MSD: 10.2 mm   5 w   5 d CRL:  2 mm   5 w   5 d                  [place] EDC: 10/16/2021 Subchorionic hemorrhage: There is a small subchorionic hemorrhage superior to the gestational sac. Maternal uterus/adnexae: Right ovary is within normal limits. There is a 3.3 x 3.2 x 2.7 cm simple cyst in the left ovary. IMPRESSION: 1. Single intrauterine gestation measuring 5 weeks 5 days by crown-rump length. No cardiac activity visualized at this time which still may be within normal limits for a gestation this age. Recommend short-term follow-up ultrasound to confirm viability. 2. Small subchorionic hemorrhage. 3. 3.3 cm simple cyst in the left ovary. Electronically Signed   By: Darliss Cheney M.D.   On: 02/19/2021 17:06     MDM UA, UPT CBC, HCG ABO/Rh- B Neg Wet prep and gc/chlamydia US OB Comp Less 14 weeks with  Transvaginal  Reviewed results of subchorionic hemorrhage with patient. Discussed that this is a common finding in the first trimester and does not usually cause problems in the pregnancy like loss or difficulty with development. Reviewed expectations for vaginal bleeding including a small amount possibly for several weeks. Reviewed warning signs of heavy bleeding, saturating a pad in less than an hour, and severe pain as reasons to come back to MAU. Encouraged patient to exercise pelvic rest until 7 days after bleeding stops. Patient verbalized understanding.   Patient reports difficulty taking metronidazole. Will premedicated with zofran.   Zofran ODT Metronidazole 2g  Assessment and Plan   1. Normal intrauterine pregnancy on prenatal ultrasound in first trimester   2. Abdominal pain affecting pregnancy   3. [redacted] weeks gestation of pregnancy   4. Trichomonal vaginitis during pregnancy in first trimester   5. Subchorionic hemorrhage of placenta in first trimester, single or unspecified fetus    -Discharge home in stable condition -First trimester precautions discussed -Patient advised to follow-up with [facility] in 2 weeks for repeat ultrasound, order placed -Patient may return to MAU as needed or if her condition were to change or  worsen   Rolm Bookbinder CNM 02/19/2021, 3:25 PM

## 2021-02-19 NOTE — MAU Note (Signed)
Pt reports to mau with c/o lower abd cramping for the past week that worsened over the last 2 days.  Pt denies taking home PT.  PT in mau is pos.  Pt reports irreg periods but states she had a normal period in august.  Denies vag bleeding.

## 2021-02-21 ENCOUNTER — Ambulatory Visit: Payer: Medicaid Other

## 2021-02-21 LAB — GC/CHLAMYDIA PROBE AMP (~~LOC~~) NOT AT ARMC
Chlamydia: NEGATIVE
Comment: NEGATIVE
Comment: NORMAL
Neisseria Gonorrhea: NEGATIVE

## 2021-03-03 ENCOUNTER — Ambulatory Visit (INDEPENDENT_AMBULATORY_CARE_PROVIDER_SITE_OTHER): Payer: Medicaid Other

## 2021-03-03 ENCOUNTER — Ambulatory Visit
Admission: RE | Admit: 2021-03-03 | Discharge: 2021-03-03 | Disposition: A | Discharge status: Home / Self Care | Payer: Medicaid Other | Source: Ambulatory Visit

## 2021-03-03 ENCOUNTER — Other Ambulatory Visit: Payer: Self-pay

## 2021-03-03 ENCOUNTER — Encounter: Payer: Self-pay | Admitting: *Deleted

## 2021-03-03 VITALS — BP 113/71 | HR 96 | Wt 134.4 lb

## 2021-03-03 DIAGNOSIS — O23591 Infection of other part of genital tract in pregnancy, first trimester: Secondary | ICD-10-CM | POA: Diagnosis present

## 2021-03-03 DIAGNOSIS — O418X10 Other specified disorders of amniotic fluid and membranes, first trimester, not applicable or unspecified: Secondary | ICD-10-CM | POA: Diagnosis present

## 2021-03-03 DIAGNOSIS — Z3A01 Less than 8 weeks gestation of pregnancy: Secondary | ICD-10-CM | POA: Diagnosis present

## 2021-03-03 DIAGNOSIS — Z712 Person consulting for explanation of examination or test findings: Secondary | ICD-10-CM

## 2021-03-03 DIAGNOSIS — O468X1 Other antepartum hemorrhage, first trimester: Secondary | ICD-10-CM | POA: Insufficient documentation

## 2021-03-03 DIAGNOSIS — A5901 Trichomonal vulvovaginitis: Secondary | ICD-10-CM | POA: Insufficient documentation

## 2021-03-03 DIAGNOSIS — Z3491 Encounter for supervision of normal pregnancy, unspecified, first trimester: Secondary | ICD-10-CM | POA: Insufficient documentation

## 2021-03-03 DIAGNOSIS — O219 Vomiting of pregnancy, unspecified: Secondary | ICD-10-CM

## 2021-03-03 MED ORDER — PROMETHAZINE HCL 25 MG PO TABS: 25.0000 mg | ORAL_TABLET | Freq: Four times a day (QID) | ORAL | 0 refills | Status: DC | PRN

## 2021-03-03 NOTE — Progress Notes (Addendum)
Ultrasound Results  Here today for results following ultrasound for viability of pregnancy. Results reviewed with Debroah Loop, MD who finds single, living IUP at 7w 3d. Previously visualized subchorionic hemorrhage now questionable on [place]. Best EDD is 10/16/21 based on LMP. Recommends pt follow up for new ob appts with [facility] office. Message sent to [facility] for follow up. Recommended pt begin prenatal vitamin and schedule prenatal care.  Pt reports continued nausea and vomiting. Phenergan sent per protocol.   Marjo Bicker, RN 03/03/2021  11:26 AM

## 2021-03-10 ENCOUNTER — Ambulatory Visit: Payer: Medicaid Other

## 2021-04-07 ENCOUNTER — Encounter: Payer: Medicaid Other | Admitting: Obstetrics and Gynecology

## 2021-05-04 ENCOUNTER — Emergency Department (HOSPITAL_BASED_OUTPATIENT_CLINIC_OR_DEPARTMENT_OTHER): Payer: Medicaid Other

## 2021-05-04 ENCOUNTER — Emergency Department (HOSPITAL_BASED_OUTPATIENT_CLINIC_OR_DEPARTMENT_OTHER)
Admission: EM | Admit: 2021-05-04 | Discharge: 2021-05-04 | Disposition: A | Discharge status: Home / Self Care | Payer: Medicaid Other | Attending: Emergency Medicine | Admitting: Emergency Medicine

## 2021-05-04 ENCOUNTER — Inpatient Hospital Stay (HOSPITAL_COMMUNITY)
Admission: AD | Admit: 2021-05-04 | Discharge: 2021-05-04 | Disposition: A | Discharge status: Other Acute Inpatient Hospital | Payer: Medicaid Other | Attending: Obstetrics & Gynecology | Admitting: Obstetrics & Gynecology

## 2021-05-04 ENCOUNTER — Other Ambulatory Visit: Payer: Self-pay

## 2021-05-04 ENCOUNTER — Encounter (HOSPITAL_BASED_OUTPATIENT_CLINIC_OR_DEPARTMENT_OTHER): Payer: Self-pay | Admitting: Emergency Medicine

## 2021-05-04 ENCOUNTER — Encounter (HOSPITAL_COMMUNITY): Payer: Self-pay | Admitting: Obstetrics & Gynecology

## 2021-05-04 DIAGNOSIS — R1030 Lower abdominal pain, unspecified: Secondary | ICD-10-CM | POA: Insufficient documentation

## 2021-05-04 DIAGNOSIS — R102 Pelvic and perineal pain: Secondary | ICD-10-CM | POA: Insufficient documentation

## 2021-05-04 DIAGNOSIS — G43909 Migraine, unspecified, not intractable, without status migrainosus: Secondary | ICD-10-CM | POA: Insufficient documentation

## 2021-05-04 DIAGNOSIS — R000 Tachycardia, unspecified: Secondary | ICD-10-CM | POA: Diagnosis not present

## 2021-05-04 DIAGNOSIS — M549 Dorsalgia, unspecified: Secondary | ICD-10-CM | POA: Insufficient documentation

## 2021-05-04 DIAGNOSIS — F17210 Nicotine dependence, cigarettes, uncomplicated: Secondary | ICD-10-CM | POA: Diagnosis not present

## 2021-05-04 DIAGNOSIS — R519 Headache, unspecified: Secondary | ICD-10-CM | POA: Diagnosis present

## 2021-05-04 DIAGNOSIS — B9689 Other specified bacterial agents as the cause of diseases classified elsewhere: Secondary | ICD-10-CM

## 2021-05-04 DIAGNOSIS — Z3202 Encounter for pregnancy test, result negative: Secondary | ICD-10-CM | POA: Diagnosis not present

## 2021-05-04 DIAGNOSIS — N760 Acute vaginitis: Secondary | ICD-10-CM

## 2021-05-04 DIAGNOSIS — G43809 Other migraine, not intractable, without status migrainosus: Secondary | ICD-10-CM

## 2021-05-04 DIAGNOSIS — K219 Gastro-esophageal reflux disease without esophagitis: Secondary | ICD-10-CM | POA: Diagnosis not present

## 2021-05-04 DIAGNOSIS — N939 Abnormal uterine and vaginal bleeding, unspecified: Secondary | ICD-10-CM

## 2021-05-04 LAB — WET PREP, GENITAL
Sperm: NONE SEEN
Trich, Wet Prep: NONE SEEN
WBC, Wet Prep HPF POC: <10 (ref <10)
Yeast Wet Prep HPF POC: NONE SEEN

## 2021-05-04 LAB — COMPREHENSIVE METABOLIC PANEL
ALT: 12 U/L (ref 0–44)
AST: 16 U/L (ref 15–41)
Albumin: 4.5 g/dL (ref 3.5–5.0)
Alkaline Phosphatase: 48 U/L (ref 38–126)
Anion gap: 7 (ref 5–15)
BUN: 7 mg/dL (ref 6–20)
CO2: 27 mmol/L (ref 22–32)
Calcium: 9.1 mg/dL (ref 8.9–10.3)
Chloride: 105 mmol/L (ref 98–111)
Creatinine, Ser: 0.73 mg/dL (ref 0.44–1.00)
GFR, Estimated: >60 mL/min (ref >60)
Glucose, Bld: 94 mg/dL (ref 70–99)
Potassium: 3.6 mmol/L (ref 3.5–5.1)
Sodium: 139 mmol/L (ref 135–145)
Total Bilirubin: 0.3 mg/dL (ref 0.3–1.2)
Total Protein: 7.1 g/dL (ref 6.5–8.1)

## 2021-05-04 LAB — CBC WITH DIFFERENTIAL/PLATELET
Abs Immature Granulocytes: 0.01 10*3/uL (ref 0.00–0.07)
Basophils Absolute: 0 10*3/uL (ref 0.0–0.1)
Basophils Relative: 0 %
Eosinophils Absolute: 0 10*3/uL (ref 0.0–0.5)
Eosinophils Relative: 0 %
HCT: 36.5 % (ref 36.0–46.0)
Hemoglobin: 11.9 g/dL — ABNORMAL LOW (ref 12.0–15.0)
Immature Granulocytes: 0 %
Lymphocytes Relative: 6 %
Lymphs Abs: 0.4 10*3/uL — ABNORMAL LOW (ref 0.7–4.0)
MCH: 30.9 pg (ref 26.0–34.0)
MCHC: 32.6 g/dL (ref 30.0–36.0)
MCV: 94.8 fL (ref 80.0–100.0)
Monocytes Absolute: 0.9 10*3/uL (ref 0.1–1.0)
Monocytes Relative: 14 %
Neutro Abs: 5.2 10*3/uL (ref 1.7–7.7)
Neutrophils Relative %: 80 %
Platelets: 161 10*3/uL (ref 150–400)
RBC: 3.85 MIL/uL — ABNORMAL LOW (ref 3.87–5.11)
RDW: 12.6 % (ref 11.5–15.5)
WBC: 6.5 10*3/uL (ref 4.0–10.5)
nRBC: 0 % (ref 0.0–0.2)

## 2021-05-04 LAB — URINALYSIS, ROUTINE W REFLEX MICROSCOPIC

## 2021-05-04 LAB — URINALYSIS, MICROSCOPIC (REFLEX): RBC / HPF: >50 RBC/hpf (ref 0–5)

## 2021-05-04 LAB — POCT PREGNANCY, URINE: Preg Test, Ur: NEGATIVE

## 2021-05-04 LAB — PREGNANCY, URINE: Preg Test, Ur: NEGATIVE

## 2021-05-04 LAB — HCG, QUANTITATIVE, PREGNANCY: hCG, Beta Chain, Quant, S: 3 m[IU]/mL (ref <5)

## 2021-05-04 MED ORDER — OXYCODONE-ACETAMINOPHEN 5-325 MG PO TABS
1.0000 | ORAL_TABLET | ORAL | Status: AC | PRN
  Administered 2021-05-04 (×2): 1 via ORAL
  Filled 2021-05-04 (×2): qty 1

## 2021-05-04 MED ORDER — DIPHENHYDRAMINE HCL 50 MG/ML IJ SOLN
50.0000 mg | Freq: Once | INTRAMUSCULAR | Status: AC
  Administered 2021-05-04: 17:00:00 50 mg via INTRAVENOUS
  Filled 2021-05-04: qty 1

## 2021-05-04 MED ORDER — SODIUM CHLORIDE 0.9 % IV BOLUS
1000.0000 mL | Freq: Once | INTRAVENOUS | Status: AC
Start: 2021-05-04 — End: 2021-05-04
  Administered 2021-05-04: 17:00:00 1000 mL via INTRAVENOUS

## 2021-05-04 MED ORDER — MORPHINE SULFATE (PF) 4 MG/ML IV SOLN
4.0000 mg | Freq: Once | INTRAVENOUS | Status: AC
  Administered 2021-05-04: 21:00:00 4 mg via INTRAVENOUS
  Filled 2021-05-04: qty 1

## 2021-05-04 MED ORDER — METOCLOPRAMIDE HCL 5 MG/ML IJ SOLN
10.0000 mg | Freq: Once | INTRAMUSCULAR | Status: AC
  Administered 2021-05-04: 17:00:00 10 mg via INTRAVENOUS
  Filled 2021-05-04: qty 2

## 2021-05-04 MED ORDER — SODIUM CHLORIDE 0.9 % IV SOLN: 12.5000 mg | Freq: Four times a day (QID) | INTRAVENOUS | Status: DC | PRN

## 2021-05-04 MED ORDER — METRONIDAZOLE 500 MG PO TABS: 500.0000 mg | ORAL_TABLET | Freq: Two times a day (BID) | ORAL | 0 refills | Status: DC

## 2021-05-04 NOTE — ED Notes (Signed)
Pt states that initially her migraine dropped to a 5 after admin of medication. Pt stated that it is going back up after coming back from ultrasound.

## 2021-05-04 NOTE — Discharge Instructions (Addendum)
You have BV.  Take Flagyl twice daily for the next 7 days. Follow-up with gynecology in 2 to 3 days.  Information provided above, call and request an appointment tomorrow morning. Try taking the Tylenol and Motrin for headache.  can also try over-the-counter Excedrin.  I given information for neurology, schedule a follow-up or evaluation due to to the migraines.

## 2021-05-04 NOTE — MAU Note (Signed)
Heather Fox is a 25 y.o. here in MAU reporting: had a TAB on 10/17. Since then has had headaches, lower abdominal pain, and pressure in her lower back. Also reports she is still bleeding on a daily basis, some days is heavy and some days is light. Bleeding is light today.  Onset of complaint: ongoing  Pain score: headache 10/10, abdomen 9/10, back 5/10  Vitals:   05/04/21 1314  BP: 111/82  Pulse: (!) 108  Resp: 20  Temp: 99.5 F (37.5 C)  SpO2: 100%     Lab orders placed from triage: upt

## 2021-05-04 NOTE — ED Notes (Signed)
Pt verbalizes understanding of discharge instructions. Opportunity for questioning and answers were provided. Pt discharged from ED to home.   ? ?

## 2021-05-04 NOTE — MAU Note (Signed)
Per registration pt went to [facility] and needs to be removed off MAU census. Will dc patient.

## 2021-05-04 NOTE — MAU Provider Note (Signed)
[name] is a 25 y.o. [phone] patient who presents to MAU today with complaint of back pain and headache. Patient had a TAB in Oct and is concerned this may be the cause of these symptoms.   O BP 111/82 (BP Location: Right Arm)    Pulse (!) 108    Temp 99.5 F (37.5 C) (Oral)    Resp 20    Ht 5\' 6"  (1.676 m)    Wt 60.5 kg    SpO2 100% Comment: room air   Breastfeeding Unknown    BMI 21.53 kg/m  Physical Exam Constitutional:      General: She is not in acute distress.    Appearance: She is well-developed. She is not ill-appearing, toxic-appearing or diaphoretic.  Neurological:     Mental Status: She is alert and oriented to person, place, and time.    A Medical screening exam complete Pregnancy test negative   P  Patient given the option of transfer to [facility] for further evaluation or seek care in outpatient facility of choice. Patient requests to be seen at [facility] ED.  Provider in ED notified. Ok for transfer.  [facility], NP 05/04/2021 1:25 PM

## 2021-05-04 NOTE — ED Provider Notes (Signed)
[facility] EMERGENCY DEPT Provider Note   CSN: [id number] Arrival date & time: 05/04/21  1446     History Chief Complaint  Patient presents with   Migraine   Back Pain   Pelvic Pain    Heather Fox is a 25 y.o. female.  HPI  Patient presents with migraine, back pain, pelvic pain.  She had a chemical abortion in October, she has been having pelvic pain intermittently since then.  It is suprapubic/pelvic, not well localized.  Associated with painless vaginal bleeding.  Some vaginal discharge, not significantly different from normal.  No dysuria, some hematuria.  She has tried Tylenol which has not alleviated the pelvic pain.  The pelvic pain is slightly alleviated by Percocet here in the ED but is still constant.  Nothing seems to make it better, no obvious aggravating factors.  Back pain started acutely last night, it was atraumatic.  It is in the lumbar spine, also to the left and right paraspinal muscles.  It is worsened by movement, feels like she is having pain going down the lateral sides of both legs.  No urinary retention, bilateral leg weakness, saddle anesthesia.  Migraine has been going on the last 3 days.  History of migraines, somewhat alleviated by Tylenol but the headaches return.  No nausea or vomiting, photophobia and photophobia present.  No vision changes.  Past Medical History:  Diagnosis Date   GERD (gastroesophageal reflux disease)     Patient Active Problem List   Diagnosis Date Noted   Trichomonas infection 02/19/2021   Chlamydia infection 09/08/2020   Vitamin D deficiency 04/27/2017   GERD (gastroesophageal reflux disease) 12/21/2014   Rh negative state in antepartum period 11/19/2014    Past Surgical History:  Procedure Laterality Date   NO PAST SURGERIES     WISDOM TOOTH EXTRACTION       OB History     Gravida  4   Para  2   Term  2   Preterm      AB      Living  2      SAB      IAB      Ectopic       Multiple  0   Live Births  2           Family History  Problem Relation Age of Onset   Stroke Mother    Cancer Neg Hx    Diabetes Neg Hx    Heart disease Neg Hx    Hypertension Neg Hx    Hearing loss Neg Hx     Social History   Tobacco Use   Smoking status: Every Day    Packs/day: 0.50    Types: Cigarettes   Smokeless tobacco: Never   Tobacco comments:    quit summer 2018  Vaping Use   Vaping Use: Never used  Substance Use Topics   Alcohol use: No   Drug use: Yes    Types: Marijuana    Comment: last was last wk    Home Medications Prior to Admission medications   Medication Sig Start Date End Date Taking? Authorizing Provider  acetaminophen (TYLENOL) 500 MG tablet Take 500 mg by mouth every 6 (six) hours as needed for mild pain. Patient not taking: Reported on 03/03/2021    [provider]  promethazine (PHENERGAN) 25 MG tablet Take 1 tablet (25 mg total) by mouth every 6 (six) hours as needed for nausea or vomiting. 03/03/21   Roselie Awkward,  Currie ParisJames G, MD  Vitamin D, Ergocalciferol, (DRISDOL) 1.25 MG (50000 UNIT) CAPS capsule Take 1 capsule (50,000 Units total) by mouth every 7 (seven) days. Patient not taking: No sig reported 09/19/19   Anders SimmondsMcClung, Angela M, PA-C    Allergies    Naproxen  Review of Systems   Review of Systems  Constitutional:  Positive for fatigue. Negative for fever.  Respiratory:  Negative for shortness of breath.   Cardiovascular:  Negative for chest pain.  Gastrointestinal:  Positive for abdominal pain, nausea and vomiting.  Genitourinary:  Positive for vaginal bleeding and vaginal pain. Negative for dysuria.  Musculoskeletal:  Positive for back pain.  Allergic/Immunologic: Negative for immunocompromised state.  Neurological:  Negative for syncope.   Physical Exam Updated Vital Signs BP 117/80 (BP Location: Right Arm)    Pulse (!) 111    Temp 99.7 F (37.6 C)    Resp (!) 24    Ht 5\' 5"  (1.651 m)    Wt 60.3 kg    SpO2 100%    BMI 22.13  kg/m   Physical Exam Vitals and nursing note reviewed. Exam conducted with a chaperone present.  Constitutional:      Appearance: Normal appearance.     Comments: Patient appears uncomfortable, not toxic  HENT:     Head: Normocephalic and atraumatic.     Mouth/Throat:     Mouth: Mucous membranes are dry.  Eyes:     General: No scleral icterus.       Right eye: No discharge.        Left eye: No discharge.     Extraocular Movements: Extraocular movements intact.     Pupils: Pupils are equal, round, and reactive to light.     Comments: No nystagmus   Neck:     Comments: No midline cervical tenderness. No palpable deformities.  Cardiovascular:     Rate and Rhythm: Regular rhythm. Tachycardia present.     Pulses: Normal pulses.     Heart sounds: Normal heart sounds. No murmur heard.   No friction rub. No gallop.     Comments: DP, PT, and radial pulses 2+ and symmetrical bilaterally Pulmonary:     Effort: Pulmonary effort is normal. No respiratory distress.     Breath sounds: Normal breath sounds.  Abdominal:     General: Abdomen is flat. Bowel sounds are normal. There is no distension.     Palpations: Abdomen is soft.     Tenderness: There is abdominal tenderness. There is no right CVA tenderness or left CVA tenderness.     Comments: Diffuse lower abdominal tenderness, primarily suprapubic without rigidity or guarding  Genitourinary:    Comments: Pelvic exam performed with chaperone in room.  There is slight vaginal pooling, no cervical wall motion tenderness or friability to the cervix.  No adnexal tenderness. Musculoskeletal:        General: Tenderness present.     Cervical back: Normal range of motion. No rigidity or tenderness.     Comments: No significant midline tenderness or palpable step-offs.  Diffuse lumbar tenderness  Skin:    General: Skin is warm and dry.     Capillary Refill: Capillary refill takes less than 2 seconds.     Coloration: Skin is not jaundiced.      Findings: No bruising or erythema.  Neurological:     Mental Status: She is alert and oriented to person, place, and time. Mental status is at baseline.     Coordination: Coordination normal.  Comments: Patient is alert, oriented to personal, place and time with normal speech. Cranial nerves III-XII grossly in tact. Grip strength equal bilaterally LE strength equal bilaterally. Sensation to light touch in tact bilaterally. No gait abnormalities, patient ambulatory.    Psychiatric:        Mood and Affect: Mood normal.   ED Results / Procedures / Treatments   Labs (all labs ordered are listed, but only abnormal results are displayed) Labs Reviewed  WET PREP, GENITAL  URINALYSIS, ROUTINE W REFLEX MICROSCOPIC  PREGNANCY, URINE  GC/CHLAMYDIA PROBE AMP ([place]) NOT AT [facility]    EKG None  Radiology No results found.  Procedures Procedures   Medications Ordered in ED Medications  oxyCODONE-acetaminophen (PERCOCET/ROXICET) 5-325 MG per tablet 1 tablet (1 tablet Oral Given 05/04/21 1534)    ED Course  I have reviewed the triage vital signs and the nursing notes.  Pertinent labs & imaging results that were available during my care of the patient were reviewed by me and considered in my medical decision making (see chart for details).    MDM Rules/Calculators/A&P                           Patient was initially tachycardic with an elevated temperature, not quite febrile.  Does not meet septic criteria.  Initial work-up for abdominal pain started in triage, no leukocytosis, slight decrease in hemoglobin at 11.9 but not significantly anemic and very stable.  CMP without any gross electrolyte derangement, slight bump in creatinine.  Unable to identify any signs of UTI on UA given the amount of blood.  On pelvic exam patient does not have any adnexal tenderness, there was vaginal canal blood.  No cervical wall motion tenderness so I do not suspect this is PID.  Unable to visualize any  retained products.  Given that the vaginal bleeding is been ongoing for almost 2 months I do think it is reasonable to do an ultrasound to better evaluate for retained products conception.  I do not think this is an ovarian torsion.  The back pain appears secondary for the pelvic pain, no midline tenderness concerning for fracture and no trauma.  She has no focal deficits on exam, do not think imaging of the head or back is needed at this time.  She also has history of chronic migraines, no focal deficits and the migraine improved with pain medicine.   Patient has a positive for BV, will start on Flagyl.  Ultrasound unable to fully evaluate or rule out products of conception.  Will consult with OBG for their recommendations.  Spoke with Dr. Roselie Awkward with OBG.  We discussed the lab results as well as patient nebulous ultrasound finding.  Because she has a negative urine pregnancy, he advises getting a quant.  Agrees that persistent vaginal bleeding is not typical but does not think it is due to retained products of conception.  He advises getting a hCG quant and having the patient follow-up in their office in 2 to 3 days.  I appreciate his consult.  Discussed the work-up with the patient.  She is agreeable to the plan, return precautions were discussed and patient feels comfortable with outpatient follow-up.  Patient tachycardia improved, migraine and back pain is now a 1 out of 10.  Patient discharged in stable condition. Final Clinical Impression(s) / ED Diagnoses Final diagnoses:  None    Rx / DC Orders ED Discharge Orders     None  Theron Arista, PA-C 05/04/21 2131    Cheryll Cockayne, MD 05/08/21 2308

## 2021-05-04 NOTE — MAU Note (Signed)
Report given to Gaffer. NT to take pt to ED via wheelchair

## 2021-05-04 NOTE — ED Triage Notes (Signed)
Reports migraine for several days, low back pain and pain in cervix. Worse this morning around midnight. Reports termination of pregnancy in October, has had vaginal bleeding since.

## 2021-05-05 LAB — GC/CHLAMYDIA PROBE AMP (~~LOC~~) NOT AT ARMC
Chlamydia: NEGATIVE
Comment: NEGATIVE
Comment: NORMAL
Neisseria Gonorrhea: NEGATIVE

## 2021-05-06 ENCOUNTER — Ambulatory Visit: Payer: Medicaid Other | Admitting: Obstetrics

## 2021-05-30 ENCOUNTER — Ambulatory Visit: Payer: Medicaid Other | Admitting: Obstetrics and Gynecology

## 2021-06-08 ENCOUNTER — Encounter: Payer: Self-pay | Admitting: Psychiatry

## 2021-06-08 ENCOUNTER — Other Ambulatory Visit: Payer: Self-pay

## 2021-06-08 ENCOUNTER — Ambulatory Visit: Payer: Medicaid Other | Admitting: Psychiatry

## 2021-06-08 VITALS — BP 104/71 | HR 96 | Ht 65.0 in | Wt 133.0 lb

## 2021-06-08 DIAGNOSIS — G43009 Migraine without aura, not intractable, without status migrainosus: Secondary | ICD-10-CM | POA: Diagnosis not present

## 2021-06-08 MED ORDER — AMITRIPTYLINE HCL 25 MG PO TABS: ORAL_TABLET | ORAL | 3 refills | Status: DC

## 2021-06-08 MED ORDER — RIZATRIPTAN BENZOATE 10 MG PO TABS: 10.0000 mg | ORAL_TABLET | ORAL | 2 refills | Status: DC | PRN

## 2021-06-08 NOTE — Patient Instructions (Addendum)
Start amitriptyline for headache prevention. Take 1/2 pill at bedtime for one week, then increase to 1 pill at bedtime. It may take 6-8 weeks for the medications to build in your system. Start Maxalt as needed for migraines. Take at the onset of migraine. If headache recurs or does not fully resolve, you may take a second dose after 2 hours. Please avoid taking more than 2 days per week to avoid rebound headaches Try to limit Tylenol to 2 days per week to avoid rebound headaches
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
## 2021-07-02 ENCOUNTER — Encounter: Payer: Self-pay | Admitting: Psychiatry

## 2021-07-04 ENCOUNTER — Other Ambulatory Visit: Payer: Self-pay | Admitting: Psychiatry

## 2021-07-04 MED ORDER — SUMATRIPTAN SUCCINATE 100 MG PO TABS: 100.0000 mg | ORAL_TABLET | ORAL | 3 refills | Status: DC | PRN

## 2021-07-22 ENCOUNTER — Encounter (HOSPITAL_COMMUNITY): Payer: Self-pay | Admitting: Emergency Medicine

## 2021-07-22 ENCOUNTER — Emergency Department (HOSPITAL_COMMUNITY)
Admission: EM | Admit: 2021-07-22 | Discharge: 2021-07-23 | Disposition: A | Discharge status: Home / Self Care | Payer: Medicaid Other | Attending: Emergency Medicine | Admitting: Emergency Medicine

## 2021-07-22 ENCOUNTER — Other Ambulatory Visit: Payer: Self-pay

## 2021-07-22 DIAGNOSIS — H1032 Unspecified acute conjunctivitis, left eye: Secondary | ICD-10-CM | POA: Diagnosis present

## 2021-07-22 MED ORDER — FLUORESCEIN SODIUM 1 MG OP STRP
ORAL_STRIP | OPHTHALMIC | Status: AC
  Filled 2021-07-22: qty 1

## 2021-07-22 MED ORDER — TETRACAINE HCL 0.5 % OP SOLN
2.0000 [drp] | Freq: Once | OPHTHALMIC | Status: AC
Start: 2021-07-23 — End: 2021-07-23
  Administered 2021-07-23: 2 [drp] via OPHTHALMIC
  Filled 2021-07-22: qty 4

## 2021-07-22 NOTE — ED Triage Notes (Signed)
Patient reports left eye pain with redness/swelling and drainage onset yesterday , no blurred vision , she accidentally poked her left eye yesterday .  ?

## 2021-07-22 NOTE — ED Provider Triage Note (Signed)
Emergency Medicine Provider Triage Evaluation Note ? ?Heather Fox , a 26 y.o. female  was evaluated in triage.  Pt complains of left eye pain onset yesterday.  Patient has associated eye redness/swelling/drainage.  Has not tried any medications for his symptoms.  Denies blurred vision, rhinorrhea, nasal congestion, sore throat.  Denies wearing contacts or glasses.  Patient does wear eyelashes and notes that these are not new for her.  She notes she has sick contacts with similar symptoms of her family members.  Patient has allergy to naproxen. ? ?Review of Systems  ?Positive: As per HPI above ?Negative: As per HPI above ? ?Physical Exam  ?BP 116/85   Pulse 97   Temp 98.5 ?F (36.9 ?C) (Oral)   Resp 16   Ht 5\' 5"  (1.651 m)   Wt 66 kg   LMP 06/25/2021   SpO2 99%   BMI 24.21 kg/m?  ?Gen:   Awake, no distress   ?Resp:  Normal effort  ?MSK:   Moves extremities without difficulty  ?Other:  Conjunctival injection noted to left eye.  Yellow drainage noted from left eye with redness of the sclera.  Visual fields grossly intact.  PERRL.  EOMI. ? ?Medical Decision Making  ?Medically screening exam initiated at 11:12 PM.  Appropriate orders placed.  Heather Fox was informed that the remainder of the evaluation will be completed by another provider, this initial triage assessment does not replace that evaluation, and the importance of remaining in the ED until their evaluation is complete. ? ?  ?Heather Fox A, PA-C ?07/22/21 2319 ? ?

## 2021-07-23 MED ORDER — ERYTHROMYCIN 5 MG/GM OP OINT: TOPICAL_OINTMENT | OPHTHALMIC | 0 refills | Status: DC

## 2021-07-23 NOTE — ED Provider Notes (Signed)
?[facility] EMERGENCY DEPARTMENT ?Provider Note ? ? ?CSN: [id number] ?Arrival date & time: 07/22/21  2232 ? ?  ? ?History ? ?Chief Complaint  ?Patient presents with  ? Conjunctivitis  ? ? ?Heather Fox is a 26 y.o. female who presents to the Emergency Department complaining of left eye pain onset yesterday.  Patient does not wear glasses or contacts. She has associated yellow eye discharge, eye redness, watery eyes, left eyelid swelling. She has not tried any medications for symptoms.  Denies blurred vision, rhinorrhea, nasal congestion, sore throat.  Patient does wear eyelashes however these are not new for her.  She has sick contacts who were diagnosed with pinkeye.  Patient has allergy to naproxen.  ? ? ?The history is provided by the patient. No language interpreter was used.  ? ?  ? ?Home Medications ?Prior to Admission medications   ?Medication Sig Start Date End Date Taking? Authorizing Provider  ?erythromycin ophthalmic ointment Place a 1/2 inch ribbon of ointment into the left lower eyelid 5 times daily for 7 days. 07/23/21  Yes Basim Bartnik A, PA-C  ?acetaminophen (TYLENOL) 500 MG tablet Take 500 mg by mouth every 6 (six) hours as needed for mild pain.    [provider]  ?amitriptyline (ELAVIL) 25 MG tablet Take 1/2 pill at bedtime for one week, then increase to 1 pill at bedtime 06/08/21   Ocie Doyne, MD  ?promethazine (PHENERGAN) 25 MG tablet Take 1 tablet (25 mg total) by mouth every 6 (six) hours as needed for nausea or vomiting. 03/03/21   Adam Phenix, MD  ?SUMAtriptan (IMITREX) 100 MG tablet Take 1 tablet (100 mg total) by mouth every 2 (two) hours as needed for migraine. May repeat in 2 hours if headache persists or recurs. 07/04/21   Ocie Doyne, MD  ?   ? ?Allergies    ?Naproxen   ? ?Review of Systems   ?Review of Systems  ?Constitutional:  Negative for chills and fever.  ?HENT:  Negative for congestion, rhinorrhea and sore throat.   ?Eyes:  Positive for  pain, discharge and redness. Negative for photophobia, itching and visual disturbance.  ?All other systems reviewed and are negative. ? ?Physical Exam ?Updated Vital Signs ?BP 116/85   Pulse 97   Temp 98.5 ?F (36.9 ?C) (Oral)   Resp 16   Ht 5\' 5"  (1.651 m)   Wt 66 kg   LMP 06/25/2021   SpO2 99%   BMI 24.21 kg/m?  ?Physical Exam ?Vitals and nursing note reviewed.  ?Constitutional:   ?   General: She is not in acute distress. ?   Appearance: Normal appearance. She is not ill-appearing.  ?HENT:  ?   Head: Normocephalic and atraumatic.  ?   Nose: Nose normal. No congestion or rhinorrhea.  ?   Mouth/Throat:  ?   Mouth: Mucous membranes are moist.  ?   Pharynx: Oropharynx is clear. No oropharyngeal exudate or posterior oropharyngeal erythema.  ?Eyes:  ?   General: Lids are everted, no foreign bodies appreciated. Vision grossly intact. No visual field deficit or scleral icterus.    ?   Right eye: No foreign body or discharge.     ?   Left eye: Discharge (yellow) present.No foreign body.  ?   Extraocular Movements: Extraocular movements intact.  ?   Conjunctiva/sclera:  ?   Right eye: Right conjunctiva is not injected.  ?   Left eye: Left conjunctiva is injected.  ?   Pupils: Pupils  are equal, round, and reactive to light.  ?   Comments: Lids everted, no appreciated foreign bodies. EOMI. PERRL. No visual field deficit. No fluorescein uptake.   ?Cardiovascular:  ?   Rate and Rhythm: Normal rate.  ?Pulmonary:  ?   Effort: Pulmonary effort is normal. No respiratory distress.  ?Musculoskeletal:     ?   General: Normal range of motion.  ?   Cervical back: Neck supple.  ?Skin: ?   General: Skin is warm and dry.  ?   Findings: No rash.  ?Neurological:  ?   Mental Status: She is alert.  ? ? ?ED Results / Procedures / Treatments   ?Labs ?(all labs ordered are listed, but only abnormal results are displayed) ?Labs Reviewed - No data to display ? ?EKG ?None ? ?Radiology ?No results found. ? ?Procedures ?Procedures   ? ? ?Medications Ordered in ED ?Medications  ?fluorescein 1 MG ophthalmic strip (  Given 07/22/21 2356)  ?tetracaine (PONTOCAINE) 0.5 % ophthalmic solution 2 drop (2 drops Left Eye Given 07/23/21 0048)  ? ? ?ED Course/ Medical Decision Making/ A&P ?  ?                        ?Medical Decision Making ?Risk ?Prescription drug management. ? ? ?Patient presents with left eye redness/swelling/drainage onset yesterday.  Denies glasses or contact use. Patient denies vision changes, rhinorrhea, nasal congestion.  Vital signs stable, patient afebrile. On exam, bilateral lids everted, no foreign bodies appreciated.  No visual field deficit.  EOM conjunctiva intact.  Bilateral conjunctival injection.  No fluorescein uptake.  Yellow discharge noted from left eye.  Differential diagnosis includes corneal abrasion, corneal foreign body, hyphema. Visual acuity without acute abnormalities.  ? ?Disposition: ?Patient presentation suspicious for bacterial conjunctivitis of the left eye.  Doubt corneal abrasion, fluorescein exam negative for acute foreign bodies or corneal abrasion.  Less likely hyphema at this time, no conjunctival hemorrhage noted.   Less likely corneal foreign body, overt foreign body noted on exam.  After consideration of the diagnostic results and the patients response to treatment, I feel that the patient would benefit from Discharge home.  Prescription for erythromycin ointment ordered in the ED, patient informed on how to take antibiotic ointment. Patient acknowledges and verbalizes understanding.  Provided patient with on-call information for eye specialist today and instructed patient to call and set up a follow-up appointment regarding today's ED visit. Supportive care measures and strict return precautions discussed with patient at bedside. Pt acknowledges and verbalizes understanding. Pt appears safe for discharge. Follow up as indicated in discharge paperwork.  ? ? ?This chart was dictated using voice  recognition software, Dragon. Despite the best efforts of this provider to proofread and correct errors, errors may still occur which can change documentation meaning. ? ?Final Clinical Impression(s) / ED Diagnoses ?Final diagnoses:  ?Acute bacterial conjunctivitis of left eye  ? ? ?Rx / DC Orders ?ED Discharge Orders   ? ?      Ordered  ?  erythromycin ophthalmic ointment       ? 07/23/21 0109  ? ?  ?  ? ?  ? ? ?  ?Isac Lincks A, PA-C ?07/23/21 9381 ? ?  ?Zadie Rhine, MD ?07/23/21 0175 ? ?

## 2021-07-23 NOTE — Discharge Instructions (Addendum)
It was a pleasure taking care of you today! ? ?You will be sent a prescription for erythromycin, take as directed. You may take over the counter 600 mg Ibuprofen every 6 hours or 500 mg tylenol every 6 hours as needed for pain for no more than 7 days. You may use a warm compress to your eye for up to 15 minutes at a time, ensure to place a barrier between your skin and the warm compress. You be provided information for the on-call ophthalmologist, call and set up an appointment regarding today's ED visit in the morning. You may follow-up with your primary care provider as needed.  Return to the emergency department if you are experiencing increasing/worsening fever, eye drainage, vision changes, worsening symptoms. ?

## 2021-08-26 ENCOUNTER — Encounter: Payer: Self-pay | Admitting: Psychiatry

## 2021-08-30 DIAGNOSIS — Z0289 Encounter for other administrative examinations: Secondary | ICD-10-CM

## 2021-08-30 NOTE — Telephone Encounter (Signed)
Forms completed, faxed back to [phone]. Placed in medical records for pick up.  ?

## 2021-08-30 NOTE — Telephone Encounter (Signed)
[employer] disability, leaves and accommodations form received, placed on MD desk for review and signature.  ?

## 2021-08-31 ENCOUNTER — Encounter: Payer: Self-pay | Admitting: Psychiatry

## 2021-08-31 ENCOUNTER — Ambulatory Visit: Payer: Medicaid Other | Admitting: Psychiatry

## 2021-08-31 VITALS — BP 111/73 | HR 87 | Ht 65.0 in | Wt 139.8 lb

## 2021-08-31 DIAGNOSIS — R519 Headache, unspecified: Secondary | ICD-10-CM

## 2021-08-31 DIAGNOSIS — G43009 Migraine without aura, not intractable, without status migrainosus: Secondary | ICD-10-CM

## 2021-08-31 MED ORDER — PROPRANOLOL HCL 20 MG PO TABS: 20.0000 mg | ORAL_TABLET | Freq: Two times a day (BID) | ORAL | 3 refills | Status: DC

## 2021-08-31 MED ORDER — ZOLMITRIPTAN 5 MG PO TABS: 5.0000 mg | ORAL_TABLET | ORAL | 3 refills | Status: DC | PRN

## 2021-08-31 NOTE — Patient Instructions (Addendum)
Stop amitriptyline. Start propranolol 20 mg twice a day for headache prevention. ?Stop sumatriptan. Start Zomig as needed for migraines. ?MRI brain ?

## 2021-08-31 NOTE — Progress Notes (Signed)
? ?  CC:  headaches ? ?Follow-up Visit ? ?Last visit: 06/08/21 ? ?Brief HPI: ?26 year old female who follows in clinic for migraines. ? ?At her last visit she was noted to be overusing OTCs and was counseled on medication overuse headache. She was started on amitriptyline for headache prevention and Maxalt for rescue. ? ?Interval History: ?Headaches have had mild improvement with medications. She continues to get migraines 5 days per week. Maxalt did not work so she was switched to Imitrex. Imitrex helps, but causes extreme dizziness and drowsiness which can last up to 2 days. She tried taking a half tablet but it still made her drowsy. She does not take amitriptyline every day because it also makes her drowsy. ? ? ?Headache days per month: 20 ?Headache free days per month: 10 ? ?Current Headache Regimen: ?Preventative: amitriptyline 25 mg QHS ?Abortive: Imitrex 100 mg PRN ? ?Prior Therapies                                  ?Amitriptyline 25 mg at bedtime ?Imitrex 100 mg PRN - dizziness, drowsiness ?Maxalt 10 mg PRN - lack of efficacy ? ?Physical Exam:  ? ?Vital Signs: ?BP 111/73   Pulse 87   Ht 5\' 5"  (1.651 m)   Wt 139 lb 12.8 oz (63.4 kg)   BMI 23.26 kg/m?  ?GENERAL:  well appearing, in no acute distress, alert  ?SKIN:  Color, texture, turgor normal. No rashes or lesions ?HEAD:  Normocephalic/atraumatic. ?RESP: normal respiratory effort ?MSK:  No gross joint deformities.  ? ?NEUROLOGICAL: ?Mental Status: Alert, oriented to person, place and time, Follows commands, and Speech fluent and appropriate. ?Cranial Nerves: PERRL, face symmetric, no dysarthria, hearing grossly intact ?Motor: moves all extremities equally ?Gait: normal-based. ? ?IMPRESSION: ?26 year old female who presents for follow up of migraines. She has noticed mild improvement with amitriptyline and Imitrex, however both medications cause drowsiness which limits her ability to take them. She continues to have migraines 5 days per week. Will order  MRI brain to rule out underlying structural causes for persistent near-daily headaches despite treatment. Will switch amitriptyline to propranolol for prevention. Will stop Imitrex and start Zomig for rescue. ? ?PLAN: ?-MRI brain with contrast ?-Prevention: Start propranolol 20 mg BID ?-Rescue: Start Zomig 5 mg PRN ?-next steps: consider Topamax, CGRP, Botox ? ?Follow-up: 4 months ? ?I spent a total of 22 minutes on the date of the service. Headache education was done. Discussed treatment options including preventive and acute medications. Discussed medication side effects, adverse reactions and drug interactions. Written educational materials and patient instructions outlining all of the above were given. ? ?34, MD ?08/31/21 ?10:06 AM ? ?

## 2021-09-01 ENCOUNTER — Telehealth: Payer: Self-pay

## 2021-09-01 ENCOUNTER — Other Ambulatory Visit: Payer: Self-pay | Admitting: Psychiatry

## 2021-09-01 MED ORDER — UBRELVY 100 MG PO TABS: 100.0000 mg | ORAL_TABLET | ORAL | 3 refills | Status: DC | PRN

## 2021-09-01 NOTE — Telephone Encounter (Signed)
PA for Ubrelvy 100MG  tablets submitted via CMM. Key: [id number] - PA Case ID: [id number]  ?Awaiting determination  ?

## 2021-09-01 NOTE — Telephone Encounter (Signed)
[facility] has not replied to your PA request. Turnaround time for review of a PA request is dependent upon insurance plan and can range from 24 hours to 5 calendar days ?

## 2021-09-06 ENCOUNTER — Telehealth: Payer: Self-pay | Admitting: Psychiatry

## 2021-09-06 NOTE — Telephone Encounter (Signed)
[facility] Josem Kaufmann: [id number] (exp. 4/18//23 to 11/04/21) order sent to GI. The will reach out to the patient to schedule.  ?

## 2021-09-07 ENCOUNTER — Encounter: Payer: Self-pay | Admitting: Obstetrics and Gynecology

## 2021-09-07 ENCOUNTER — Other Ambulatory Visit (HOSPITAL_COMMUNITY)
Admission: RE | Admit: 2021-09-07 | Discharge: 2021-09-07 | Disposition: A | Discharge status: Home / Self Care | Payer: Medicaid Other | Source: Ambulatory Visit | Attending: Obstetrics and Gynecology | Admitting: Obstetrics and Gynecology

## 2021-09-07 ENCOUNTER — Ambulatory Visit (INDEPENDENT_AMBULATORY_CARE_PROVIDER_SITE_OTHER): Payer: Medicaid Other | Admitting: Obstetrics and Gynecology

## 2021-09-07 DIAGNOSIS — Z01419 Encounter for gynecological examination (general) (routine) without abnormal findings: Secondary | ICD-10-CM

## 2021-09-07 DIAGNOSIS — Z202 Contact with and (suspected) exposure to infections with a predominantly sexual mode of transmission: Secondary | ICD-10-CM | POA: Diagnosis not present

## 2021-09-07 NOTE — Patient Instructions (Signed)

## 2021-09-08 LAB — CYTOLOGY - PAP
Chlamydia: NEGATIVE
Comment: NEGATIVE
Comment: NEGATIVE
Comment: NORMAL
Diagnosis: NEGATIVE
Neisseria Gonorrhea: NEGATIVE
Trichomonas: NEGATIVE

## 2021-09-08 LAB — HEPATITIS B SURFACE ANTIGEN: Hepatitis B Surface Ag: NEGATIVE

## 2021-09-08 LAB — HEPATITIS C ANTIBODY: Hep C Virus Ab: NONREACTIVE

## 2021-09-08 LAB — RPR: RPR Ser Ql: NONREACTIVE

## 2021-09-08 LAB — HIV ANTIBODY (ROUTINE TESTING W REFLEX): HIV Screen 4th Generation wRfx: NONREACTIVE

## 2021-09-08 NOTE — Progress Notes (Signed)
Heather Fox is a 26 y.o. G12P2002 female here for a routine annual gynecologic exam.  Current complaints: Desires STD testing.   Denies abnormal vaginal bleeding, discharge, pelvic pain, problems with intercourse or other gynecologic concerns.  ?  ?Gynecologic History ?Patient's last menstrual period was 08/23/2021 (exact date). ?Contraception: none ?Last Pap: 2022. Results were: normal ?Last mammogram: NA.  ? ?Obstetric History ?OB History  ?Gravida Para Term Preterm AB Living  ?4 2 2     2   ?SAB IAB Ectopic Multiple Live Births  ?      0 2  ?  ?# Outcome Date GA Lbr Len/2nd Weight Sex Delivery Anes PTL Lv  ?4 Gravida           ?3 Term 01/11/15 [redacted]w[redacted]d 15:01 / 00:32 6 lb 10.9 oz (3.03 kg) M Vag-Spont EPI  LIV  ?2 Term 05/21/09    F Vag-Spont  N LIV  ?1 Gravida           ? ? ?Past Medical History:  ?Diagnosis Date  ? GERD (gastroesophageal reflux disease)   ? Migraine   ? ? ?Past Surgical History:  ?Procedure Laterality Date  ? NO PAST SURGERIES    ? WISDOM TOOTH EXTRACTION    ? ? ?Current Outpatient Medications on File Prior to Visit  ?Medication Sig Dispense Refill  ? promethazine (PHENERGAN) 25 MG tablet Take 1 tablet (25 mg total) by mouth every 6 (six) hours as needed for nausea or vomiting. 30 tablet 0  ? propranolol (INDERAL) 20 MG tablet Take 1 tablet (20 mg total) by mouth 2 (two) times daily. 60 tablet 3  ? Ubrogepant (UBRELVY) 100 MG TABS Take 100 mg by mouth as needed. May repeat a dose in 2 hours if headache persists. Max dose 2 pills in 24 hours 16 tablet 3  ? ?No current facility-administered medications on file prior to visit.  ? ? ?Allergies  ?Allergen Reactions  ? Naproxen Other (See Comments)  ?  Swollen Throat  ? ? ?Social History  ? ?Socioeconomic History  ? Marital status: Single  ?  Spouse name: Not on file  ? Number of children: 2  ? Years of education: Not on file  ? Highest education level: Not on file  ?Occupational History  ? Not on file  ?Tobacco Use  ? Smoking status: Every Day  ?   Packs/day: 0.25  ?  Types: Cigarettes  ? Smokeless tobacco: Never  ? Tobacco comments:  ?  quit summer 2018  ?Vaping Use  ? Vaping Use: Never used  ?Substance and Sexual Activity  ? Alcohol use: No  ? Drug use: Yes  ?  Types: Marijuana  ? Sexual activity: Yes  ?  Birth control/protection: None  ?Other Topics Concern  ? Not on file  ?Social History Narrative  ? Caffeine- coffee 1 every other day  ? ?Social Determinants of Health  ? ?Financial Resource Strain: Not on file  ?Food Insecurity: Not on file  ?Transportation Needs: Not on file  ?Physical Activity: Not on file  ?Stress: Not on file  ?Social Connections: Not on file  ?Intimate Partner Violence: Not on file  ? ? ?Family History  ?Problem Relation Age of Onset  ? Stroke Mother   ? Cancer Neg Hx   ? Diabetes Neg Hx   ? Heart disease Neg Hx   ? Hypertension Neg Hx   ? Hearing loss Neg Hx   ? ? ?The following portions of the patient's history were  reviewed and updated as appropriate: allergies, current medications, past family history, past medical history, past social history, past surgical history and problem list. ? ?Review of Systems ?Pertinent items noted in HPI and remainder of comprehensive ROS otherwise negative. ?  ?Objective:  ?BP 98/64   Pulse (!) 112   Ht 5\' 5"  (1.651 m)   Wt 137 lb 8 oz (62.4 kg)   LMP 08/23/2021 (Exact Date)   Breastfeeding No   BMI 22.88 kg/m?  ?CONSTITUTIONAL: Well-developed, well-nourished female in no acute distress.  ?HENT:  Normocephalic, atraumatic, External right and left ear normal. Oropharynx is clear and moist ?EYES: Conjunctivae and EOM are normal. Pupils are equal, round, and reactive to light. No scleral icterus.  ?NECK: Normal range of motion, supple, no masses.  Normal thyroid.  ?SKIN: Skin is warm and dry. No rash noted. Not diaphoretic. No erythema. No pallor. ?NEUROLGIC: Alert and oriented to person, place, and time. Normal reflexes, muscle tone coordination. No cranial nerve deficit noted. ?PSYCHIATRIC:  Normal mood and affect. Normal behavior. Normal judgment and thought content. ?CARDIOVASCULAR: Normal heart rate noted, regular rhythm ?RESPIRATORY: Clear to auscultation bilaterally. Effort and breath sounds normal, no problems with respiration noted. ?BREASTS: Symmetric in size. No masses, skin changes, nipple drainage, or lymphadenopathy. ?ABDOMEN: Soft, normal bowel sounds, no distention noted.  No tenderness, rebound or guarding.  ?PELVIC: Normal appearing external genitalia; normal appearing vaginal mucosa and cervix.  No abnormal discharge noted.  Pap smear obtained.  Normal uterine size, no other palpable masses, no uterine or adnexal tenderness. ?MUSCULOSKELETAL: Normal range of motion. No tenderness.  No cyanosis, clubbing, or edema.  2+ distal pulses. ? ? ?Assessment:  ?Annual gynecologic examination with pap smear ?STD testing ?Plan:  ?Will follow up results of pap smear and manage accordingly. ?STD testing as per pt request ?Routine preventative health maintenance measures emphasized. ?Please refer to After Visit Summary for other counseling recommendations.  ? ? ?10/23/2021, MD, FACOG ?Attending Obstetrician & Gynecologist ?[facility]  ?

## 2021-09-12 ENCOUNTER — Ambulatory Visit: Payer: MEDICAID | Admitting: Psychiatry

## 2021-09-13 ENCOUNTER — Ambulatory Visit
Admission: RE | Admit: 2021-09-13 | Discharge: 2021-09-13 | Disposition: A | Discharge status: Home / Self Care | Payer: Medicaid Other | Source: Ambulatory Visit | Attending: Psychiatry | Admitting: Psychiatry

## 2021-09-13 DIAGNOSIS — R519 Headache, unspecified: Secondary | ICD-10-CM | POA: Diagnosis not present

## 2021-09-13 MED ORDER — GADOBENATE DIMEGLUMINE 529 MG/ML IV SOLN
14.0000 mL | Freq: Once | INTRAVENOUS | Status: AC | PRN
  Administered 2021-09-13: 14 mL via INTRAVENOUS

## 2021-09-14 NOTE — Telephone Encounter (Signed)
PA status was denied, no reason was listed  ?

## 2021-09-16 ENCOUNTER — Other Ambulatory Visit: Payer: Self-pay

## 2021-09-16 ENCOUNTER — Encounter (HOSPITAL_BASED_OUTPATIENT_CLINIC_OR_DEPARTMENT_OTHER): Payer: Self-pay

## 2021-09-16 ENCOUNTER — Emergency Department (HOSPITAL_BASED_OUTPATIENT_CLINIC_OR_DEPARTMENT_OTHER)
Admission: EM | Admit: 2021-09-16 | Discharge: 2021-09-16 | Disposition: A | Discharge status: Home / Self Care | Payer: Medicaid Other | Attending: Emergency Medicine | Admitting: Emergency Medicine

## 2021-09-16 DIAGNOSIS — G43909 Migraine, unspecified, not intractable, without status migrainosus: Secondary | ICD-10-CM | POA: Diagnosis present

## 2021-09-16 DIAGNOSIS — G43709 Chronic migraine without aura, not intractable, without status migrainosus: Secondary | ICD-10-CM | POA: Diagnosis not present

## 2021-09-16 MED ORDER — DEXAMETHASONE SODIUM PHOSPHATE 10 MG/ML IJ SOLN
10.0000 mg | Freq: Once | INTRAMUSCULAR | Status: AC
Start: 2021-09-16 — End: 2021-09-16
  Administered 2021-09-16: 10 mg via INTRAVENOUS
  Filled 2021-09-16: qty 1

## 2021-09-16 MED ORDER — METOCLOPRAMIDE HCL 5 MG/ML IJ SOLN
10.0000 mg | Freq: Once | INTRAMUSCULAR | Status: AC
  Administered 2021-09-16: 10 mg via INTRAVENOUS
  Filled 2021-09-16: qty 2

## 2021-09-16 MED ORDER — LACTATED RINGERS IV BOLUS
1000.0000 mL | Freq: Once | INTRAVENOUS | Status: AC
  Administered 2021-09-16: 1000 mL via INTRAVENOUS

## 2021-09-16 MED ORDER — DIPHENHYDRAMINE HCL 50 MG/ML IJ SOLN
25.0000 mg | Freq: Once | INTRAMUSCULAR | Status: AC
  Administered 2021-09-16: 25 mg via INTRAVENOUS
  Filled 2021-09-16: qty 1

## 2021-09-16 NOTE — ED Notes (Signed)
Discharge paperwork given and understood. 

## 2021-09-16 NOTE — Discharge Instructions (Signed)
Continue to take your prescriptions as prescribed by your neurologist and follow up with her when she returns.  ? ?Return as needed ?

## 2021-09-16 NOTE — ED Triage Notes (Signed)
Pt presents POV with "migraine all day" associated with dizziness. Pt reports she is seeing a specialist but she is out of the office for the next two weeks. Recently prescribed Propanolol with no relief  ?

## 2021-09-17 NOTE — ED Provider Notes (Signed)
?[facility] EM[facility] DEPT ?Provider Note ? ? ?CSN: [id number] ?Arrival date & time: 09/16/21  1750 ? ?  ? ?History ? ?Chief Complaint  ?Patient presents with  ? Migraine  ? Dizziness  ? ? ?Heather Fox is a 26 y.o. female with history of chronic migraines who presents to the ED for evaluation of migraine not improved with home treatment.  Patient has neurologist who recently prescribed her propanolol, however this is not improving her current migraine.  Her neurologist is currently out of town for 2 weeks and told her to come to the ER if she has an acute flareup.  Symptoms include severe pain behind the right eye without aura and dizziness.  Denies abdominal pain, nausea, vomiting, chest pain and all other systemic complaints. ? ? ?Migraine ? ?Dizziness ? ?  ? ?Home Medications ?Prior to Admission medications   ?Medication Sig Start Date End Date Taking? Authorizing Provider  ?promethazine (PHENERGAN) 25 MG tablet Take 1 tablet (25 mg total) by mouth every 6 (six) hours as needed for nausea or vomiting. 03/03/21   Adam Phenix, MD  ?propranolol (INDERAL) 20 MG tablet Take 1 tablet (20 mg total) by mouth 2 (two) times daily. 08/31/21   Ocie Doyne, MD  ?Ubrogepant (UBRELVY) 100 MG TABS Take 100 mg by mouth as needed. May repeat a dose in 2 hours if headache persists. Max dose 2 pills in 24 hours 09/01/21   Ocie Doyne, MD  ?   ? ?Allergies    ?Naproxen   ? ?Review of Systems   ?Review of Systems  ?Neurological:  Positive for dizziness.  ? ?Physical Exam ?Updated Vital Signs ?BP 115/76   Pulse 79   Temp 99.1 ?F (37.3 ?C)   Resp 16   LMP 08/23/2021 (Exact Date)   SpO2 100%  ?Physical Exam ?Vitals and nursing note reviewed.  ?Constitutional:   ?   General: She is not in acute distress. ?   Appearance: She is not ill-appearing.  ?HENT:  ?   Head: Atraumatic.  ?Eyes:  ?   Conjunctiva/sclera: Conjunctivae normal.  ?Cardiovascular:  ?   Rate and Rhythm: Normal rate and regular rhythm.  ?    Pulses: Normal pulses.  ?   Heart sounds: No murmur heard. ?Pulmonary:  ?   Effort: Pulmonary effort is normal. No respiratory distress.  ?   Breath sounds: Normal breath sounds.  ?Abdominal:  ?   General: Abdomen is flat. There is no distension.  ?   Palpations: Abdomen is soft.  ?   Tenderness: There is no abdominal tenderness.  ?Musculoskeletal:     ?   General: Normal range of motion.  ?   Cervical back: Normal range of motion.  ?Skin: ?   General: Skin is warm and dry.  ?   Capillary Refill: Capillary refill takes less than 2 seconds.  ?Neurological:  ?   General: No focal deficit present.  ?   Mental Status: She is alert.  ?   Comments: Speech is clear, able to follow commands ?CN III-XII intact ?Normal strength in upper and lower extremities bilaterally including dorsiflexion and plantar flexion, strong and equal grip strength ?Sensation normal to light and sharp touch ?Moves extremities without ataxia, coordination intact ?Normal finger to nose and rapid alternating movements ?No pronator drift ? ?  ?Psychiatric:     ?   Mood and Affect: Mood normal.  ? ? ?ED Results / Procedures / Treatments   ?Labs ?(all labs ordered are  listed, but only abnormal results are displayed) ?Labs Reviewed - No data to display ? ?EKG ?None ? ?Radiology ?No results found. ? ?Procedures ?Procedures  ? ? ?Medications Ordered in ED ?Medications  ?lactated ringers bolus 1,000 mL (0 mLs Intravenous Stopped 09/16/21 2100)  ?metoCLOPramide (REGLAN) injection 10 mg (10 mg Intravenous Given 09/16/21 1947)  ?diphenhydrAMINE (BENADRYL) injection 25 mg (25 mg Intravenous Given 09/16/21 1947)  ?dexamethasone (DECADRON) injection 10 mg (10 mg Intravenous Given 09/16/21 1949)  ? ? ?ED Course/ Medical Decision Making/ A&P ?  ?                        ?Medical Decision Making ?Risk ?Prescription drug management. ? ? ?26 year old female with history of chronic migraines presents for evaluation of migraine not relieved with at home prescriptions.   Neuro exam normal.  Vitals without acute findings.  Patient notes that this feels identical to her previous migraines, so concern for stroke or intracranial bleed is minimal.  Patient given 1 L lactated ringer bolus along with migraine cocktail to include Decadron 10 mg, Benadryl 25 mg and metoclopramide 10 mg IV with resolution of symptoms.  Patient to continue using her prescribed medications at home as needed and to follow-up with her neurologist when she returns back into town.  She can return to the ED for treatment as needed.  Discharged home in good condition ? ? ?Final Clinical Impression(s) / ED Diagnoses ?Final diagnoses:  ?Chronic migraine without aura without status migrainosus, not intractable  ? ? ?Rx / DC Orders ?ED Discharge Orders   ? ? None  ? ?  ? ? ?  ?Janell Quiet, [place] ?09/17/21 2314 ? ?  ?Alvira Monday, MD ?09/19/21 2209 ? ?

## 2022-01-09 ENCOUNTER — Telehealth: Payer: Self-pay | Admitting: Psychiatry

## 2022-01-09 NOTE — Telephone Encounter (Signed)
LVM and sent mychart msg informing pt of r/s needed for 9/12 appt- MD out.

## 2022-01-30 ENCOUNTER — Ambulatory Visit: Payer: Medicaid Other | Admitting: Psychiatry

## 2022-01-30 ENCOUNTER — Encounter: Payer: Self-pay | Admitting: Psychiatry

## 2022-01-30 VITALS — BP 109/67 | HR 92 | Ht 65.0 in | Wt 149.0 lb

## 2022-01-30 DIAGNOSIS — G43009 Migraine without aura, not intractable, without status migrainosus: Secondary | ICD-10-CM | POA: Diagnosis not present

## 2022-01-30 MED ORDER — NURTEC 75 MG PO TBDP: 75.0000 mg | ORAL_TABLET | ORAL | 6 refills | Status: DC | PRN

## 2022-01-30 MED ORDER — EMGALITY 120 MG/ML ~~LOC~~ SOAJ: 1.0000 | SUBCUTANEOUS | 6 refills | Status: DC

## 2022-01-30 NOTE — Progress Notes (Signed)
   CC:  headaches  Follow-up Visit  Last visit: 08/31/21  Brief HPI: 26 year old female who follows in clinic for migraines.  At her last visit brain MRI was ordered. She was started on propranolol for migraine prevention and Ubrelvy for rescue.  Interval History: She has had some improvement in migraine frequency since starting propranolol. However she feels her headache severity is worse. She currently has 2 headaches per week. She has been taking propranolol inconsistently because it causes brain fog and a sensation of pressure in her head.  Bernita Raisin does not help much for rescue. It can take hours to start working.  Brain MRI 09/13/21 was unremarkable.   Headache days per month: 8 Headache free days per month: 22  Current Headache Regimen: Preventative: propranolol 20 mg BID Abortive: Ubrelvy 100 mg PRN  Prior Therapies                                  Amitriptyline 25 mg at bedtime - drowsiness Propranolol - brain fog Imitrex 100 mg PRN - dizziness, drowsiness Maxalt 10 mg PRN - lack of efficacy Ubrelvy 100 mg PRN - lack of efficacy    Physical Exam:   Vital Signs: BP 109/67   Pulse 92   Ht 5\' 5"  (1.651 m)   Wt 149 lb (67.6 kg)   BMI 24.79 kg/m  GENERAL:  well appearing, in no acute distress, alert  SKIN:  Color, texture, turgor normal. No rashes or lesions HEAD:  Normocephalic/atraumatic. RESP: normal respiratory effort MSK:  No gross joint deformities.   NEUROLOGICAL: Mental Status: Alert, oriented to person, place and time, Follows commands, and Speech fluent and appropriate. Cranial Nerves: PERRL, face symmetric, no dysarthria, hearing grossly intact Motor: moves all extremities equally Gait: normal-based.  IMPRESSION: 26 year old female without significant medical history who presents for follow up of migraines. Brain MRI was unremarkable. She has not been able to tolerate propranolol daily due to side effects. Will switch to [facility] for migraine  prevention. [facility] has not been effective for rescue. Will start Nurtec as needed.  PLAN: -Prevention: Stop propranolol. Start Emgality 120 mg monthly -Rescue: Star Nurtec 75 mg PRN -Next steps: consider topamax, Qulipta, Botox  Follow-up: 3 months  I spent a total of 23 minutes on the date of the service. Headache education was done. Discussed treatment options including preventive and acute medications. Discussed medication side effects, adverse reactions and drug interactions. Written educational materials and patient instructions outlining all of the above were given.  Bernita Raisin, MD 01/30/22 3:54 PM

## 2022-01-30 NOTE — Patient Instructions (Addendum)
Start Emgality monthly for migraine prevention Start Nurtec as needed for migraine. Take one pill as needed for migraine. Max dose 1 pill in 24 hours.  GENERAL HEADACHE INSTRUCTIONS Headache Preventive Treatment: Please keep in mind that it takes 4-6 weeks for the medication to start working well and 2-3 months at the appropriate dose before deciding if it will be useful or not. If it is not helping at all by this time, then we will discuss other medications to try. Supplements may take 3-6 months until you see full effect.   Natural supplements: Magnesium Oxide or Magnesium Glycinate 500 mg at bed (up to 800 mg daily) Coenzyme Q10 300 mg in AM Vitamin B2- 200 mg twice a day  Add 1 supplement at a time since even natural supplements can have undesirable side effects. You can sometimes buy supplements cheaper (especially Coenzyme Q10) at www.WebmailGuide.co.za or at [employer].  Vitamins and herbs that show potential:  Magnesium: Magnesium (250 mg twice a day or 500 mg at bed) has a relaxant effect on smooth muscles such as blood vessels. Individuals suffering from frequent or daily headache usually have low magnesium levels which can be increase with daily supplementation of 400-750 mg. Three trials found 40-90% average headache reduction  when used as a preventative. Magnesium also demonstrated the benefit in menstrually related migraine.  Magnesium is part of the messenger system in the serotonin cascade and it is a good muscle relaxant.  It is also useful for constipation which can be a side effect of other medications used to treat migraine. Good sources include nuts, whole grains, and tomatoes. Side Effects: loose stool/diarrhea Riboflavin (vitamin B 2) 200 mg twice a day. This vitamin assists nerve cells in the production of ATP a principal energy storing molecule.  It is necessary for many chemical reactions in the body.  There have been at least 3 clinical trials of riboflavin using 400 mg per day all  of which suggested that migraine frequency can be decreased.  All 3 trials showed significant improvement in over half of migraine sufferers.  The supplement is found in bread, cereal, milk, meat, and poultry.  Most Americans get more riboflavin than the recommended daily allowance, however riboflavin deficiency is not necessary for the supplements to help prevent headache. Side effects: energizing, green urine  Coenzyme Q10: This is present in almost all cells in the body and is critical component for the conversion of energy.  Recent studies have shown that a nutritional supplement of CoQ10 can reduce the frequency of migraine attacks by improving the energy production of cells as with riboflavin.  Doses of 150 mg twice a day have been shown to be effective.  Melatonin: Increasing evidence shows correlation between melatonin secretion and headache conditions.  Melatonin supplementation has decreased headache intensity and duration.  It is widely used as a sleep aid.  Sleep is natures way of dealing with migraine.  A dose of 3 mg is recommended to start for headaches including cluster headache. Higher doses up to 15 mg has been reviewed for use in Cluster headache and have been used. The rationale behind using melatonin for cluster is that many theories regarding the cause of Cluster headache center around the disruption of the normal circadian rhythm in the brain.  This helps restore the normal circadian rhythm.  Ginger: Ginger has a small amount of antihistamine and anti-inflammatory action which may help headache.  It is primarily used for nausea and may aid in the absorption of other  medications. HEADACHE DIET: Foods and beverages which may trigger migraine Note that only 20% of headache patients are food sensitive. You will know if you are food sensitive if you get a headache consistently 20 minutes to 2 hours after eating a certain food. Only cut out a food if it causes headaches, otherwise you might  remove foods you enjoy! What matters most for diet is to eat a well balanced healthy diet full of vegetables and low fat protein, and to not miss meals.  Chocolate, other sweets ALL cheeses except cottage and cream cheese Dairy products, yogurt, sour cream, ice cream Liver Meat extracts (Bovril, Marmite, meat tenderizers) Meats or fish which have undergone aging, fermenting, pickling or smoking. These include: Hotdogs,salami,Lox,sausage, mortadellas,smoked salmon, pepperoni, Pickled herring Pods of broad bean (English beans, Chinese pea pods, [place] (fava) beans, lima and navy beans Ripe avocado, ripe banana Yeast extracts or active yeast preparations such as Brewer's or Fleishman's (commercial bakes goods are permitted) Tomato based foods, pizza (lasagna, etc.)  MSG (monosodium glutamate) is disguised as many things; look for these common aliases: Monopotassium glutamate Autolysed yeast Hydrolysed protein Sodium caseinate "flavorings" "all natural preservatives" Nutrasweet  Avoid all other foods that convincingly provoke headaches.  Resources: The Dizzy [place] Laundry Your Headache Diet, migrainestrong.com  https://zamora-andrews.com/  Caffeine and Migraine For patients that have migraine, caffeine intake more than 3 days per week can lead to dependency and increased migraine frequency. I would recommend cutting back on your caffeine intake as best you can. The recommended amount of caffeine is 200-300 mg daily, although migraine patients may experience dependency at even lower doses. While you may notice an increase in headache temporarily, cutting back will be helpful for headaches in the long run. For more information on caffeine and migraine, visit: https://americanmigrainefoundation.org/resource-library/caffeine-and-migraine/  Headache Prevention Strategies:  1. Maintain a headache diary; learn to identify and avoid triggers.  - This  can be a simple note where you log when you had a headache, associated symptoms, and medications used - There are several smartphone apps developed to help track migraines: Migraine Buddy, Migraine Monitor, Curelator N1-Headache App  Common triggers include: Emotional triggers: Emotional/Upset family or friends Emotional/Upset occupation Business reversal/success Anticipation anxiety Crisis-serious Post-crisis periodNew job/position   Physical triggers: Vacation Day Weekend Strenuous Exercise High Altitude Location New Move Menstrual Day Physical Illness Oversleep/Not enough sleep Weather changes Light: Photophobia or light sesnitivity treatment involves a balance between desensitization and reduction in overly strong input. Use dark polarized glasses outside, but not inside. Avoid bright or fluorescent light, but do not dim environment to the point that going into a normally lit room hurts. Consider FL-41 tint lenses, which reduce the most irritating wavelengths without blocking too much light.  These can be obtained at axonoptics.com or theraspecs.com Foods: see list above.  2. Limit use of acute treatments (over-the-counter medications, triptans, etc.) to no more than 2 days per week or 10 days per month to prevent medication overuse headache (rebound headache).    3. Follow a regular schedule (including weekends and holidays): Don't skip meals. Eat a balanced diet. 8 hours of sleep nightly. Minimize stress. Exercise 30 minutes per day. Being overweight is associated with a 5 times increased risk of chronic migraine. Keep well hydrated and drink 6-8 glasses of water per day.  4. Initiate non-pharmacologic measures at the earliest onset of your headache. Rest and quiet environment. Relax and reduce stress. Breathe2Relax is a free app that can instruct you on    some simple  relaxtion and breathing techniques. Http://Dawnbuse.com is a    free website that provides teaching videos  on relaxation.  Also, there are  many apps that   can be downloaded for "mindful" relaxation.  An app called YOGA NIDRA will help walk you through mindfulness. Another app called Calm can be downloaded to give you a structured mindfulness guide with daily reminders and skill development. Headspace for guided meditation Mindfulness Based Stress Reduction Online Course: www.palousemindfulness.com Cold compresses.  5. Don't wait!! Take the maximum allowable dosage of prescribed medication at the first sign of migraine.  6. Compliance:  Take prescribed medication regularly as directed and at the first sign of a migraine.  7. Communicate:  Call your physician when problems arise, especially if your headaches change, increase in frequency/severity, or become associated with neurological symptoms (weakness, numbness, slurred speech, etc.).  8. Headache/pain management therapies: Consider various complementary methods, including medication, behavioral therapy, psychological counselling, biofeedback, massage therapy, acupuncture, dry needling, and other modalities.  Such measures may reduce the need for medications. Counseling for pain management, where patients learn to function and ignore/minimize their pain, seems to work very well.  9. Recommend changing family's attention and focus away from patient's headaches. Instead, emphasize daily activities. If first question of day is 'How are your headaches/Do you have a headache today?', then patient will constantly think about headaches, thus making them worse. Goal is to re-direct attention away from headaches, toward daily activities and other distractions.  10. Helpful Websites: www.AmericanHeadacheSociety.org PatentHood.ch www.headaches.org TightMarket.nl www.achenet.org

## 2022-01-31 ENCOUNTER — Ambulatory Visit: Payer: Medicaid Other | Admitting: Psychiatry

## 2022-01-31 ENCOUNTER — Telehealth: Payer: Self-pay

## 2022-01-31 ENCOUNTER — Encounter: Payer: Self-pay | Admitting: Psychiatry

## 2022-01-31 ENCOUNTER — Other Ambulatory Visit: Payer: Self-pay

## 2022-01-31 MED ORDER — EMGALITY 120 MG/ML ~~LOC~~ SOAJ: 120.0000 mg | Freq: Once | SUBCUTANEOUS | 0 refills | Status: AC

## 2022-01-31 NOTE — Telephone Encounter (Signed)
PA for Emgality has been started. Key: [id number].  Clinical question asked " Has the patient had a negative pregnancy test at baseline?"   Please advise.

## 2022-01-31 NOTE — Telephone Encounter (Signed)
Last pregnancy test was in December 2022, let me know if she needs a new one and I'll order it for her

## 2022-01-31 NOTE — Telephone Encounter (Signed)
PA APPROVED. Case #: [id number]  Coverage from: 01/31/22-05/01/22

## 2022-01-31 NOTE — Telephone Encounter (Signed)
Pt called stating that she got nervous taking her first Emgality injection to where it spilled out as soon as she tried to take it. She states the second injection her father did it.  She is asking to get another Emgality sample tp pick up and save for next month until insurance approves. Please advise.

## 2022-01-31 NOTE — Telephone Encounter (Signed)
I gave her the loading dose sample, looks like she only got one of the two shots in. She can pick up another sample if she needs to get the second shot of the loading dose

## 2022-01-31 NOTE — Telephone Encounter (Signed)
Called pt and spoke with her she stated that her last pregnancy test was in April when she got her physical that came back negative.

## 2022-01-31 NOTE — Telephone Encounter (Signed)
PA for Nurtec has been started. WOE:[id number]    Noted in the PA.  Has the patient tried and failed one or more of the following: non-steroidal anti-inflammatory drug (NSAID), nonopioid analgesic, acetaminophen, or caffeinated analgesic combination?  Please advise.

## 2022-01-31 NOTE — Telephone Encounter (Signed)
She can come in and get a sample from [place], we should still have some in the sample closet

## 2022-01-31 NOTE — Telephone Encounter (Signed)
She has failed the following NSAIDs: ibuprofen, naproxen, and toradol

## 2022-01-31 NOTE — Telephone Encounter (Signed)
PA APPROVED. Coverage starts: 01/31/22-01/31/23  Case #: [id number]

## 2022-01-31 NOTE — Telephone Encounter (Signed)
Called pt and let her know that she can come and pick up Emgality sample.

## 2022-03-03 ENCOUNTER — Emergency Department (HOSPITAL_BASED_OUTPATIENT_CLINIC_OR_DEPARTMENT_OTHER)
Admission: EM | Admit: 2022-03-03 | Discharge: 2022-03-03 | Disposition: A | Discharge status: Home / Self Care | Payer: Medicaid Other | Attending: Emergency Medicine | Admitting: Emergency Medicine

## 2022-03-03 ENCOUNTER — Emergency Department (HOSPITAL_BASED_OUTPATIENT_CLINIC_OR_DEPARTMENT_OTHER): Payer: Medicaid Other

## 2022-03-03 ENCOUNTER — Encounter (HOSPITAL_BASED_OUTPATIENT_CLINIC_OR_DEPARTMENT_OTHER): Payer: Self-pay

## 2022-03-03 DIAGNOSIS — R102 Pelvic and perineal pain: Secondary | ICD-10-CM | POA: Diagnosis not present

## 2022-03-03 DIAGNOSIS — N938 Other specified abnormal uterine and vaginal bleeding: Secondary | ICD-10-CM | POA: Diagnosis present

## 2022-03-03 LAB — CBC
HCT: 35.1 % — ABNORMAL LOW (ref 36.0–46.0)
Hemoglobin: 11.4 g/dL — ABNORMAL LOW (ref 12.0–15.0)
MCH: 30.6 pg (ref 26.0–34.0)
MCHC: 32.5 g/dL (ref 30.0–36.0)
MCV: 94.4 fL (ref 80.0–100.0)
Platelets: 185 10*3/uL (ref 150–400)
RBC: 3.72 MIL/uL — ABNORMAL LOW (ref 3.87–5.11)
RDW: 13 % (ref 11.5–15.5)
WBC: 7.4 10*3/uL (ref 4.0–10.5)
nRBC: 0 % (ref 0.0–0.2)

## 2022-03-03 LAB — PREGNANCY, URINE: Preg Test, Ur: NEGATIVE

## 2022-03-03 MED ORDER — MEGESTROL ACETATE 40 MG PO TABS
ORAL_TABLET | ORAL | 0 refills | Status: DC
Start: 2022-03-03 — End: 2022-04-18

## 2022-03-03 MED ORDER — MORPHINE SULFATE (PF) 4 MG/ML IV SOLN
4.0000 mg | Freq: Once | INTRAVENOUS | Status: DC
  Filled 2022-03-03: qty 1

## 2022-03-03 MED ORDER — MORPHINE SULFATE (PF) 4 MG/ML IV SOLN
4.0000 mg | Freq: Once | INTRAVENOUS | Status: AC
  Administered 2022-03-03: 4 mg via INTRAMUSCULAR

## 2022-03-03 NOTE — ED Provider Notes (Signed)
[facility] Provider Note   CSN: [id number] Arrival date & time: 03/03/22  1135     History  Chief Complaint  Patient presents with   Vaginal Bleeding    Heather Fox is a 26 y.o. female with a past medical history of bilateral ovarian cysts presenting today with nausea.  Reports that she has been bleeding for the past 3 weeks and 2 days.  Over the past couple of days she has been passing clots.  She says that at first she thought it was her.  But after 1 week she realized that there must be something wrong.  Also reporting some cramping.  Does not take any medications for this and declined birth control because it "messes with her body."  Is being treated for chronic migraines with multiple medications so she avoids any over-the-counter medications.  Denies any shortness of breath, palpitations or syncope.  Says occasionally she feels lightheaded but she thinks that is when she gets migraines.  Has not seen OB/GYN.         Vaginal Bleeding Associated symptoms: no dysuria, no fever, no nausea and no vaginal discharge        Home Medications Prior to Admission medications   Medication Sig Start Date End Date Taking? Authorizing Provider  Galcanezumab-gnlm (EMGALITY) 120 MG/ML SOAJ Inject 1 Pen into the skin every 30 (thirty) days. 01/30/22   Genia Harold, MD  promethazine (PHENERGAN) 25 MG tablet Take 1 tablet (25 mg total) by mouth every 6 (six) hours as needed for nausea or vomiting. 03/03/21   Woodroe Mode, MD  Rimegepant Sulfate (NURTEC) 75 MG TBDP Take 75 mg by mouth as needed (for migraines). Max dose 1 pill in 24 hours 01/30/22   Genia Harold, MD      Allergies    Naproxen    Review of Systems   Review of Systems  Constitutional:  Negative for chills and fever.  Gastrointestinal:  Negative for nausea and vomiting.  Genitourinary:  Positive for menstrual problem, pelvic pain and vaginal bleeding. Negative for dysuria,  vaginal discharge and vaginal pain.    Physical Exam Updated Vital Signs BP 118/67   Pulse (!) 158   Temp 98.3 F (36.8 C) (Oral)   Resp 18   LMP 03/03/2022   SpO2 99%  Physical Exam Vitals and nursing note reviewed.  Constitutional:      Appearance: Normal appearance.  HENT:     Head: Normocephalic and atraumatic.     Mouth/Throat:     Mouth: Mucous membranes are moist.     Pharynx: Oropharynx is clear.  Eyes:     General: No scleral icterus.    Conjunctiva/sclera: Conjunctivae normal.  Pulmonary:     Effort: Pulmonary effort is normal. No respiratory distress.  Abdominal:     General: Abdomen is flat.     Palpations: Abdomen is soft.  Skin:    Findings: No rash.  Neurological:     Mental Status: She is alert.  Psychiatric:        Mood and Affect: Mood normal.     ED Results / Procedures / Treatments   Labs (all labs ordered are listed, but only abnormal results are displayed) Labs Reviewed  CBC - Abnormal; Notable for the following components:      Result Value   RBC 3.72 (*)    Hemoglobin 11.4 (*)    HCT 35.1 (*)    All other components within normal limits  PREGNANCY, URINE  EKG None  Radiology No results found.  Procedures Procedures   Medications Ordered in ED Medications  morphine (PF) 4 MG/ML injection 4 mg (4 mg Intramuscular Given 03/03/22 1524)    ED Course/ Medical Decision Making/ A&P                           Medical Decision Making Amount and/or Complexity of Data Reviewed Labs: ordered. Radiology: ordered.  Risk Prescription drug management.   26 year old female presenting today with vaginal bleeding.  Reports its been going on for 3 days constantly and she recently started passing clots.  Differential includes but is not limited to uterine fibroids, pregnancy, hormone imbalance, coagulopathy   Past Medical History / Co-morbidities / Social History: Previous ovarian cysts   Additional history: Per chart review  patient has had multiple visits and abnormal TSH, could contribute to her abnormal bleeding   Physical Exam: Pertinent physical exam findings include No elicitable tenderness on physical exam  Lab Tests: I ordered, and personally interpreted labs.  The pertinent results include: Hemoglobin 11.4   Imaging Studies: I ordered and independently visualized and interpreted pelvic ultrasound and I agree with the radiologist that she has a thickened endometrium.  Also with a small left-sided hemorrhagic ovarian cyst.   Cardiac Monitoring:  The patient was maintained on a cardiac monitor.  I viewed and interpreted the cardiac monitored which showed an underlying rhythm of: Normal sinus.  Heart rate was originally recorded at 158 however at bedside states in the 90s.   Medications: Morphine for pain.  Reevaluation reveals improvement in symptoms   Consultations Obtained: I spoke with Dr. Despina Hidden with OB/GYN who recommends Megace and follow-up.  MDM/Disposition: This is a 26 year old female who presented today due to abnormal uterine bleeding.  Says that she has been bleeding for 3-1/2 weeks constantly.  Now passing clots.  Her ultrasound is revealing of a thickened endometrium.  This is likely something that will require hormonal treatment however patient is resistant to this.  She is agreeable to starting outpatient Megace and following up with OB/GYN.  This has been sent to her pharmacy and all of her questions were answered.  She has a stable hemoglobin, is not tachycardic as originally recorded and is stable for discharge.   Final Clinical Impression(s) / ED Diagnoses Final diagnoses:  DUB (dysfunctional uterine bleeding)    Rx / DC Orders ED Discharge Orders          Ordered    megestrol (MEGACE) 40 MG tablet        03/03/22 1606           Results and diagnoses were explained to the patient. Return precautions discussed in full. Patient had no additional questions and expressed  complete understanding.   This chart was dictated using voice recognition software.  Despite best efforts to proofread,  errors can occur which can change the documentation meaning.     Saddie Benders, PA-C 03/03/22 1608    Glyn Ade, MD 03/07/22 1455

## 2022-03-03 NOTE — ED Triage Notes (Signed)
Pt presents POV from home, ca/ox4 ambulatory to triage  Pt reports abnormal, irregular heavy menstrual cycles x3 months, pt reports. Pt unsure of the exact date for her cycles. Pt denies taking BC prevention.   Pt just started a monthly injection for migraines last month.  Pt reports today both her legs "felt different"

## 2022-03-03 NOTE — Discharge Instructions (Signed)
As we discussed, the medication I prescribed should help lessen your vaginal bleeding.  Ultimately you need to speak with OB/GYN about long-term control of your abnormal uterine bleeding.  Please return with any worsening symptoms, especially shortness of breath, dizziness or loss of consciousness.  Otherwise follow-up outpatient is acceptable.  Your work note is attached.  It was a pleasure to meet you and I hope you feel better!

## 2022-03-13 ENCOUNTER — Ambulatory Visit: Payer: Medicaid Other | Admitting: Advanced Practice Midwife

## 2022-03-13 NOTE — Progress Notes (Deleted)
   GYNECOLOGY PROGRESS NOTE  History:  26 y.o. [id number] presents to [facility] *** office today for problem gyn visit. She reports *****.  She denies h/a, dizziness, shortness of breath, n/v, or fever/chills.    The following portions of the patient's history were reviewed and updated as appropriate: allergies, current medications, past family history, past medical history, past social history, past surgical history and problem list. Last pap smear on 09/07/21 was normal.  Health Maintenance Due  Topic Date Due   COVID-19 Vaccine (1) Never done   HPV VACCINES (3 - 3-dose series) 03/10/2021   INFLUENZA VACCINE  12/20/2021     Review of Systems:  Pertinent items are noted in HPI.   Objective:  Physical Exam Last menstrual period 03/03/2022. VS reviewed, nursing note reviewed,  Constitutional: well developed, well nourished, no distress HEENT: normocephalic CV: normal rate Pulm/chest wall: normal effort Breast Exam: deferred Abdomen: soft Neuro: alert and oriented x 3 Skin: warm, dry Psych: affect normal Pelvic exam: Cervix pink, visually closed, without lesion, scant white creamy discharge, vaginal walls and external genitalia normal Bimanual exam: Cervix 0/long/high, firm, anterior, neg CMT, uterus nontender, nonenlarged, adnexa without tenderness, enlargement, or mass  Assessment & Plan:  There are no diagnoses linked to this encounter.  No follow-ups on file.   Fatima Blank, CNM 8:22 AM

## 2022-03-21 ENCOUNTER — Encounter: Payer: Self-pay | Admitting: Obstetrics and Gynecology

## 2022-03-21 ENCOUNTER — Ambulatory Visit: Payer: Medicaid Other | Admitting: Obstetrics and Gynecology

## 2022-03-21 VITALS — BP 119/77 | HR 94 | Ht 65.0 in | Wt 158.6 lb

## 2022-03-21 DIAGNOSIS — R102 Pelvic and perineal pain: Secondary | ICD-10-CM

## 2022-03-21 DIAGNOSIS — N83209 Unspecified ovarian cyst, unspecified side: Secondary | ICD-10-CM | POA: Diagnosis not present

## 2022-03-21 DIAGNOSIS — N939 Abnormal uterine and vaginal bleeding, unspecified: Secondary | ICD-10-CM

## 2022-03-21 MED ORDER — TRAMADOL HCL 50 MG PO TABS
50.0000 mg | ORAL_TABLET | Freq: Four times a day (QID) | ORAL | 0 refills | Status: DC | PRN
Start: 2022-03-21 — End: 2022-03-31

## 2022-03-21 NOTE — Progress Notes (Signed)
Patient presents for abnormal uterine bleeding. Last LMP lasted for about 2 weeks. She was given megace which stopped her bleeding for 3 days, and then bleeding started back. Pain 10/10. Taking tylenol for the pain, but no relief. Changing pads every 5-10 minutes. Bleeding is lighter today.

## 2022-03-21 NOTE — Progress Notes (Signed)
26 yo P2 with LMP 03/03/22 and BMI 26 here as an ED follow up on pelvic pain and AUB. Patient reports amenorrhea in September, followed by heavy vaginal bleeding in October which lasted for close to 2 weeks. Bleeding is now very light. She continues to experience some left LLQ pain. Patient is sexually active without contraception. She is not interested in hormonal contraception as it cause her to have mood swings and depression. Her bleeding improved with Megace  Past Medical History:  Diagnosis Date   GERD (gastroesophageal reflux disease)    Migraine    Past Surgical History:  Procedure Laterality Date   NO PAST SURGERIES     WISDOM TOOTH EXTRACTION     Family History  Problem Relation Age of Onset   Stroke Mother    Cancer Neg Hx    Diabetes Neg Hx    Heart disease Neg Hx    Hypertension Neg Hx    Hearing loss Neg Hx    Social History   Tobacco Use   Smoking status: Every Day    Packs/day: 0.25    Types: Cigarettes   Smokeless tobacco: Never   Tobacco comments:    quit summer 2018  Vaping Use   Vaping Use: Never used  Substance Use Topics   Alcohol use: No   Drug use: Yes    Types: Marijuana    Comment: 3-4 times per week   ROS See pertinent in HPI. All other systems reviewed and non contributory Blood pressure 119/77, pulse 94, height 5\' 5"  (1.651 m), weight 158 lb 9.6 oz (71.9 kg), last menstrual period 03/03/2022. GENERAL: Well-developed, well-nourished female in no acute distress.  NEURO: alert and oriented x 3  03/05/2022 PELVIC COMPLETE WITH TRANSVAGINAL  Result Date: 03/03/2022 CLINICAL DATA:  Vaginal bleeding, irregular menstrual cycle every 3 months, LMP 03/03/2022 EXAM: TRANSABDOMINAL AND TRANSVAGINAL ULTRASOUND OF PELVIS TECHNIQUE: Both transabdominal and transvaginal ultrasound examinations of the pelvis were performed. Transabdominal technique was performed for global imaging of the pelvis including uterus, ovaries, adnexal regions, and pelvic cul-de-sac. It  was necessary to proceed with endovaginal exam following the transabdominal exam to visualize the ovaries. COMPARISON:  05/04/2021 FINDINGS: Uterus Measurements: 8.8 x 4.6 x 6.3 cm = volume: 133 mL. Anteverted. Normal morphology without mass. Endometrium Thickness: 23 mm. Abnormal appearance, heterogeneous, thickened, with scattered microcystic changes and trace fluid. No discrete mass. Right ovary Measurements: 4.8 x 2.4 x 3.4 cm = volume: 20.5 mL. Normal morphology without mass Left ovary Measurements: 4.7 x 3.4 x 3.4 cm = volume: 28.4 mL. 2.7 cm diameter hemorrhagic cyst LEFT ovary; no follow-up imaging recommended Other findings Trace nonspecific free pelvic fluid.  No other adnexal masses. IMPRESSION: 2.7 cm diameter hemorrhagic cyst LEFT ovary; no follow-up imaging recommended. Abnormal thickened heterogeneous endometrial complex 23 mm thick; if bleeding remains unresponsive to hormonal or medical therapy, focal lesion work-up with sonohysterogram should be considered. Endometrial biopsy should also be considered in pre-menopausal patients at high risk for endometrial carcinoma. (Ref: Radiological Reasoning: Algorithmic Workup of Abnormal Vaginal Bleeding with Endovaginal Sonography and Sonohysterography. AJR [id number]) Electronically Signed   By: ; [id number] M.D.   On: 03/03/2022 15:39     A/P 26 yo with pelvic pain and AUB - Reviewed ultrasound findings with hemorrhagic cyst likely source of her pain - Rx Tramadol provided as patient cannot take NSAID and tylenol provides minimal relief - Follow up ultrasound ordered - RTC if no improvement in bleeding or pain - Patient is  current on pap smear

## 2022-03-27 ENCOUNTER — Ambulatory Visit: Payer: Medicaid Other | Admitting: Student

## 2022-03-31 ENCOUNTER — Telehealth: Payer: Self-pay | Admitting: Emergency Medicine

## 2022-03-31 ENCOUNTER — Other Ambulatory Visit: Payer: Self-pay | Admitting: Obstetrics and Gynecology

## 2022-03-31 ENCOUNTER — Other Ambulatory Visit: Payer: Self-pay | Admitting: Emergency Medicine

## 2022-03-31 MED ORDER — TRAMADOL HCL 50 MG PO TABS: 50.0000 mg | ORAL_TABLET | Freq: Four times a day (QID) | ORAL | 0 refills | Status: DC | PRN

## 2022-03-31 NOTE — Telephone Encounter (Signed)
RC to patient to discuss reported symptoms. Pt reports continued pelvic pain with tramadol use. States she has to take double dose.  Pt transferred to front desk to schedule f/u visit with MD as pain is not being managed. Pt given phone number to schedule a sooner apt for [place].

## 2022-03-31 NOTE — Progress Notes (Signed)
Reordered Tramadol 15tabs, no refills per Dr. Donavan Foil, MD. Pt to f/u with Dr. Jolayne Panther, MD

## 2022-04-01 ENCOUNTER — Other Ambulatory Visit (INDEPENDENT_AMBULATORY_CARE_PROVIDER_SITE_OTHER): Payer: Medicaid Other | Admitting: Obstetrics and Gynecology

## 2022-04-01 DIAGNOSIS — N83209 Unspecified ovarian cyst, unspecified side: Secondary | ICD-10-CM

## 2022-04-01 MED ORDER — TRAMADOL HCL 50 MG PO TABS: 50.0000 mg | ORAL_TABLET | Freq: Four times a day (QID) | ORAL | 0 refills | Status: DC | PRN

## 2022-04-01 NOTE — Progress Notes (Signed)
Tramadol not sent correctly on chart review.  Will resend electronically.   Mariel Aloe, MD

## 2022-04-03 ENCOUNTER — Other Ambulatory Visit: Payer: Self-pay | Admitting: Obstetrics and Gynecology

## 2022-04-03 ENCOUNTER — Other Ambulatory Visit: Payer: Self-pay

## 2022-04-03 DIAGNOSIS — N83209 Unspecified ovarian cyst, unspecified side: Secondary | ICD-10-CM

## 2022-04-03 MED ORDER — TRAMADOL HCL 50 MG PO TABS: 50.0000 mg | ORAL_TABLET | Freq: Four times a day (QID) | ORAL | 0 refills | Status: DC | PRN

## 2022-04-03 NOTE — Progress Notes (Signed)
Rx resent electronically, however, it printed instead. Will ask Provider in the Office to send

## 2022-04-17 ENCOUNTER — Ambulatory Visit (HOSPITAL_BASED_OUTPATIENT_CLINIC_OR_DEPARTMENT_OTHER)
Admission: RE | Admit: 2022-04-17 | Discharge: 2022-04-17 | Disposition: A | Discharge status: Home / Self Care | Payer: Medicaid Other | Source: Ambulatory Visit | Attending: Obstetrics and Gynecology | Admitting: Obstetrics and Gynecology

## 2022-04-17 ENCOUNTER — Other Ambulatory Visit: Payer: Self-pay | Admitting: Obstetrics and Gynecology

## 2022-04-17 DIAGNOSIS — N83209 Unspecified ovarian cyst, unspecified side: Secondary | ICD-10-CM | POA: Diagnosis present

## 2022-04-18 ENCOUNTER — Encounter: Payer: Self-pay | Admitting: Emergency Medicine

## 2022-04-18 ENCOUNTER — Other Ambulatory Visit: Payer: Self-pay | Admitting: Obstetrics and Gynecology

## 2022-04-18 ENCOUNTER — Telehealth: Payer: Self-pay | Admitting: Emergency Medicine

## 2022-04-18 DIAGNOSIS — N938 Other specified abnormal uterine and vaginal bleeding: Secondary | ICD-10-CM

## 2022-04-18 DIAGNOSIS — R102 Pelvic and perineal pain: Secondary | ICD-10-CM | POA: Insufficient documentation

## 2022-04-18 MED ORDER — NORETHINDRONE ACETATE 5 MG PO TABS: 10.0000 mg | ORAL_TABLET | Freq: Every day | ORAL | 2 refills | Status: DC

## 2022-04-18 MED ORDER — OXYCODONE-ACETAMINOPHEN 5-325 MG PO TABS: 1.0000 | ORAL_TABLET | ORAL | 0 refills | Status: DC | PRN

## 2022-04-18 NOTE — Telephone Encounter (Signed)
Incoming call from pt with c/o continued AUB and pelvic pain.  Pt informed of [place] results. Consult with Alysia Penna, MD. MD to send in Megace refill, pain medication.

## 2022-04-24 ENCOUNTER — Other Ambulatory Visit (HOSPITAL_COMMUNITY)
Admission: RE | Admit: 2022-04-24 | Discharge: 2022-04-24 | Disposition: A | Discharge status: Home / Self Care | Payer: Medicaid Other | Source: Ambulatory Visit | Attending: Obstetrics and Gynecology | Admitting: Obstetrics and Gynecology

## 2022-04-24 ENCOUNTER — Ambulatory Visit: Payer: Medicaid Other | Admitting: Obstetrics and Gynecology

## 2022-04-24 ENCOUNTER — Encounter: Payer: Self-pay | Admitting: Obstetrics and Gynecology

## 2022-04-24 VITALS — BP 107/64 | HR 123 | Wt 158.0 lb

## 2022-04-24 DIAGNOSIS — R102 Pelvic and perineal pain: Secondary | ICD-10-CM

## 2022-04-24 DIAGNOSIS — N946 Dysmenorrhea, unspecified: Secondary | ICD-10-CM | POA: Diagnosis not present

## 2022-04-24 DIAGNOSIS — Z712 Person consulting for explanation of examination or test findings: Secondary | ICD-10-CM

## 2022-04-24 MED ORDER — NORETHINDRONE-ETH ESTRADIOL 0.4-35 MG-MCG PO TABS: ORAL_TABLET | ORAL | 0 refills | Status: DC

## 2022-04-24 MED ORDER — NORETHINDRONE-ETH ESTRADIOL 0.4-35 MG-MCG PO TABS: 1.0000 | ORAL_TABLET | Freq: Every day | ORAL | 11 refills | Status: DC

## 2022-04-24 NOTE — Progress Notes (Signed)
Pt states family hx of endometriosis.  Pt is in office today for u/s results and ?testing.

## 2022-04-24 NOTE — Progress Notes (Signed)
26 yo P2 here to discuss results of recent ultrasound. Patient last seen for the evaluation of dysmenorrhea and pelvic pain related to the presence of a hemorrhagic cyst. Patient reports some improvement in her bleeding with Megace but slightly. Bleeding returned upon completion of course. She reports persistent pelvic pain. She reports concern for endometriosis due to a family history.  Past Medical History:  Diagnosis Date   GERD (gastroesophageal reflux disease)    Migraine    Past Surgical History:  Procedure Laterality Date   NO PAST SURGERIES     WISDOM TOOTH EXTRACTION     Family History  Problem Relation Age of Onset   Stroke Mother    Cancer Neg Hx    Diabetes Neg Hx    Heart disease Neg Hx    Hypertension Neg Hx    Hearing loss Neg Hx    Social History   Tobacco Use   Smoking status: Every Day    Packs/day: 0.25    Types: Cigarettes   Smokeless tobacco: Never   Tobacco comments:    quit summer 2018  Vaping Use   Vaping Use: Never used  Substance Use Topics   Alcohol use: No   Drug use: Yes    Types: Marijuana    Comment: 3-4 times per week   ROS See pertinent in HPI. All other systems reviewed and non contributory Blood pressure 107/64, pulse (!) 123, weight 158 lb (71.7 kg). GENERAL: Well-developed, well-nourished female in no acute distress.  HEENT: Normocephalic, atraumatic. Sclerae anicteric.  NECK: Supple. Normal thyroid.  LUNGS: Clear to auscultation bilaterally.  HEART: Regular rate and rhythm. BREASTS: Symmetric in size. No palpable masses or lymphadenopathy, skin changes, or nipple drainage. ABDOMEN: Soft, nontender, nondistended. No organomegaly. PELVIC: Normal external female genitalia. Vagina is pink and rugated.  Normal discharge. Normal appearing cervix. Uterus is normal in size. No adnexal mass or tenderness. Chaperone present during the pelvic exam EXTREMITIES: No cyanosis, clubbing, or edema, 2+ distal pulses.   US PELVIC COMPLETE WITH  TRANSVAGINAL  Result Date: 04/17/2022 CLINICAL DATA:  Follow-up ovarian cyst EXAM: TRANSABDOMINAL AND TRANSVAGINAL ULTRASOUND OF PELVIS TECHNIQUE: Both transabdominal and transvaginal ultrasound examinations of the pelvis were performed. Transabdominal technique was performed for global imaging of the pelvis including uterus, ovaries, adnexal regions, and pelvic cul-de-sac. It was necessary to proceed with endovaginal exam following the transabdominal exam to visualize the uterus endometrium ovaries. COMPARISON:  Ultrasound 03/03/2022 FINDINGS: Uterus Measurements: 9.3 x 5.3 x 6.1 cm = volume: 156.9 mL. No fibroids or other mass visualized. Endometrium Thickness: 14 mm.  No focal abnormality visualized. Right ovary Measurements: 4.3 x 2.3 by 2.1 cm = volume: 10.9 mL. Normal appearance/no adnexal mass. Left ovary Measurements: 3.6 x 2.1 x 2.3 cm = volume: 9.0 mL. Normal appearance/no adnexal mass. Other findings No abnormal free fluid. IMPRESSION: 1. Normal premenopausal endometrial thickness. 2. Otherwise negative pelvic ultrasound. Electronically Signed   By: Jasmine Pang M.D.   On: 04/17/2022 19:25     A/P 26 yo P2 with dysmenorrhea - Reviewed ultrasound results with the patient along with complete resolution of previously seen ovarian cyst - Patient with AUB for several months which may contribute to pelvic pain. Patient willing to try OCP taper and 1 pack of COC Rx provided - Patient referred to Dr Briscoe Deutscher if no improvement of her pelvic pain following OCP taper - vaginal swab collected to rule out infection as source of pain - Patient will be contacted with abnormal results

## 2022-04-25 LAB — CERVICOVAGINAL ANCILLARY ONLY
Bacterial Vaginitis (gardnerella): POSITIVE — AB
Candida Glabrata: NEGATIVE
Candida Vaginitis: NEGATIVE
Chlamydia: NEGATIVE
Comment: NEGATIVE
Comment: NEGATIVE
Comment: NEGATIVE
Comment: NEGATIVE
Comment: NEGATIVE
Comment: NORMAL
Neisseria Gonorrhea: NEGATIVE
Trichomonas: NEGATIVE

## 2022-04-25 MED ORDER — METRONIDAZOLE 500 MG PO TABS: 500.0000 mg | ORAL_TABLET | Freq: Two times a day (BID) | ORAL | 0 refills | Status: DC

## 2022-04-25 NOTE — Addendum Note (Signed)
Addended by: Catalina Antigua on: 04/25/2022 04:33 PM   Modules accepted: Orders

## 2022-04-27 NOTE — Progress Notes (Deleted)
No chief complaint on file.   HISTORY OF PRESENT ILLNESS:  04/27/22 ALL:  Heather Fox is a 26 y.o. female here today for follow up for migraines. She was last seen 01/2022 and reported brain fog on propranolol. She was started on Emgality and propranolol discontinued. Nurtec used for abortive therapy. Since.    HISTORY (copied from Dr Quentin Mulling previous note)  Brief HPI: 26 year old female who follows in clinic for migraines.   At her last visit brain MRI was ordered. She was started on propranolol for migraine prevention and Ubrelvy for rescue.   Interval History: She has had some improvement in migraine frequency since starting propranolol. However she feels her headache severity is worse. She currently has 2 headaches per week. She has been taking propranolol inconsistently because it causes brain fog and a sensation of pressure in her head.   Bernita Raisin does not help much for rescue. It can take hours to start working.   Brain MRI 09/13/21 was unremarkable.     Headache days per month: 8 Headache free days per month: 22   Current Headache Regimen: Preventative: propranolol 20 mg BID Abortive: Ubrelvy 100 mg PRN   Prior Therapies                                  Amitriptyline 25 mg at bedtime - drowsiness Propranolol - brain fog Imitrex 100 mg PRN - dizziness, drowsiness Maxalt 10 mg PRN - lack of efficacy Ubrelvy 100 mg PRN - lack of efficacy   REVIEW OF SYSTEMS: Out of a complete 14 system review of symptoms, the patient complains only of the following symptoms, and all other reviewed systems are negative.   ALLERGIES: Allergies  Allergen Reactions   Naproxen Other (See Comments)    Swollen Throat     HOME MEDICATIONS: Outpatient Medications Prior to Visit  Medication Sig Dispense Refill   Galcanezumab-gnlm (EMGALITY) 120 MG/ML SOAJ Inject 1 Pen into the skin every 30 (thirty) days. 1.12 mL 6   metroNIDAZOLE (FLAGYL) 500 MG tablet Take 1 tablet (500  mg total) by mouth 2 (two) times daily. 14 tablet 0   norethindrone (AYGESTIN) 5 MG tablet Take 2 tablets (10 mg total) by mouth daily. 30 tablet 2   norethindrone-ethinyl estradiol (OVCON-35) 0.4-35 MG-MCG tablet Take 3 tabletsdaily the first 3 days, followed by 2 tablets daily the next 3 days and 1 tablet daily until end of active pills, skipping the sugar pills 28 tablet 0   norethindrone-ethinyl estradiol (OVCON-35) 0.4-35 MG-MCG tablet Take 1 tablet by mouth daily. 28 tablet 11   oxyCODONE-acetaminophen (PERCOCET/ROXICET) 5-325 MG tablet Take 1 tablet by mouth every 4 (four) hours as needed. 30 tablet 0   promethazine (PHENERGAN) 25 MG tablet Take 1 tablet (25 mg total) by mouth every 6 (six) hours as needed for nausea or vomiting. 30 tablet 0   Rimegepant Sulfate (NURTEC) 75 MG TBDP Take 75 mg by mouth as needed (for migraines). Max dose 1 pill in 24 hours 8 tablet 6   No facility-administered medications prior to visit.     PAST MEDICAL HISTORY: Past Medical History:  Diagnosis Date   GERD (gastroesophageal reflux disease)    Migraine      PAST SURGICAL HISTORY: Past Surgical History:  Procedure Laterality Date   NO PAST SURGERIES     WISDOM TOOTH EXTRACTION       FAMILY HISTORY: Family  History  Problem Relation Age of Onset   Stroke Mother    Cancer Neg Hx    Diabetes Neg Hx    Heart disease Neg Hx    Hypertension Neg Hx    Hearing loss Neg Hx      SOCIAL HISTORY: Social History   Socioeconomic History   Marital status: Single    Spouse name: Not on file   Number of children: 2   Years of education: Not on file   Highest education level: Not on file  Occupational History   Not on file  Tobacco Use   Smoking status: Every Day    Packs/day: 0.25    Types: Cigarettes   Smokeless tobacco: Never   Tobacco comments:    quit summer 2018  Vaping Use   Vaping Use: Never used  Substance and Sexual Activity   Alcohol use: No   Drug use: Yes    Types:  Marijuana    Comment: 3-4 times per week   Sexual activity: Yes    Partners: Male    Birth control/protection: None  Other Topics Concern   Not on file  Social History Narrative   Caffeine- coffee 1 every other day   Social Determinants of Health   Financial Resource Strain: Not on file  Food Insecurity: Not on file  Transportation Needs: Not on file  Physical Activity: Not on file  Stress: Not on file  Social Connections: Not on file  Intimate Partner Violence: Not on file     PHYSICAL EXAM  There were no vitals filed for this visit. There is no height or weight on file to calculate BMI.  Generalized: Well developed, in no acute distress  Cardiology: normal rate and rhythm, no murmur auscultated  Respiratory: clear to auscultation bilaterally    Neurological examination  Mentation: Alert oriented to time, place, history taking. Follows all commands speech and language fluent Cranial nerve II-XII: Pupils were equal round reactive to light. Extraocular movements were full, visual field were full on confrontational test. Facial sensation and strength were normal. Uvula tongue midline. Head turning and shoulder shrug  were normal and symmetric. Motor: The motor testing reveals 5 over 5 strength of all 4 extremities. Good symmetric motor tone is noted throughout.  Sensory: Sensory testing is intact to soft touch on all 4 extremities. No evidence of extinction is noted.  Coordination: Cerebellar testing reveals good finger-nose-finger and heel-to-shin bilaterally.  Gait and station: Gait is normal. Tandem gait is normal. Romberg is negative. No drift is seen.  Reflexes: Deep tendon reflexes are symmetric and normal bilaterally.    DIAGNOSTIC DATA (LABS, IMAGING, TESTING) - I reviewed patient records, labs, notes, testing and imaging myself where available.  Lab Results  Component Value Date   WBC 7.4 03/03/2022   HGB 11.4 (L) 03/03/2022   HCT 35.1 (L) 03/03/2022   MCV  94.4 03/03/2022   PLT 185 03/03/2022      Component Value Date/Time   NA 139 05/04/2021 1636   K 3.6 05/04/2021 1636   CL 105 05/04/2021 1636   CO2 27 05/04/2021 1636   GLUCOSE 94 05/04/2021 1636   BUN 7 05/04/2021 1636   CREATININE 0.73 05/04/2021 1636   CALCIUM 9.1 05/04/2021 1636   PROT 7.1 05/04/2021 1636   ALBUMIN 4.5 05/04/2021 1636   AST 16 05/04/2021 1636   ALT 12 05/04/2021 1636   ALKPHOS 48 05/04/2021 1636   BILITOT 0.3 05/04/2021 1636   GFRNONAA >60 05/04/2021 1636  GFRAA >60 04/22/2019 1307   No results found for: "CHOL", "HDL", "LDLCALC", "LDLDIRECT", "TRIG", "CHOLHDL" Lab Results  Component Value Date   HGBA1C 5.1 04/24/2017   No results found for: "VITAMINB12" Lab Results  Component Value Date   TSH 1.040 09/23/2019        No data to display               No data to display           ASSESSMENT AND PLAN  26 y.o. year old female  has a past medical history of GERD (gastroesophageal reflux disease) and Migraine. here with    No diagnosis found.  Olayinka Leda Gauze ***.  Healthy lifestyle habits encouraged. *** will follow up with PCP as directed. *** will return to see me in ***, sooner if needed. *** verbalizes understanding and agreement with this plan.   No orders of the defined types were placed in this encounter.    No orders of the defined types were placed in this encounter.    Shawnie Dapper, MSN, FNP-C 04/27/2022, 1:34 PM  [facility] [address], [address] [place], [place] [address] [phone]

## 2022-04-27 NOTE — Patient Instructions (Incomplete)

## 2022-04-29 ENCOUNTER — Encounter: Payer: Self-pay | Admitting: Obstetrics and Gynecology

## 2022-05-01 ENCOUNTER — Other Ambulatory Visit: Payer: Self-pay | Admitting: Emergency Medicine

## 2022-05-01 ENCOUNTER — Ambulatory Visit: Payer: Medicaid Other | Admitting: Family Medicine

## 2022-05-01 DIAGNOSIS — B9689 Other specified bacterial agents as the cause of diseases classified elsewhere: Secondary | ICD-10-CM

## 2022-05-01 DIAGNOSIS — G43009 Migraine without aura, not intractable, without status migrainosus: Secondary | ICD-10-CM

## 2022-05-01 MED ORDER — METRONIDAZOLE 0.75 % VA GEL: 1.0000 | Freq: Every day | VAGINAL | 1 refills | Status: DC

## 2022-05-01 NOTE — Progress Notes (Signed)
Rx for BV, unable to tolerate oral abx.

## 2022-05-08 ENCOUNTER — Other Ambulatory Visit (HOSPITAL_BASED_OUTPATIENT_CLINIC_OR_DEPARTMENT_OTHER): Payer: Medicaid Other

## 2022-05-09 ENCOUNTER — Encounter: Payer: Self-pay | Admitting: Obstetrics and Gynecology

## 2022-05-09 ENCOUNTER — Ambulatory Visit: Payer: Medicaid Other | Admitting: Obstetrics and Gynecology

## 2022-05-09 VITALS — BP 124/78 | HR 102 | Wt 157.5 lb

## 2022-05-09 DIAGNOSIS — R102 Pelvic and perineal pain: Secondary | ICD-10-CM

## 2022-05-09 DIAGNOSIS — N739 Female pelvic inflammatory disease, unspecified: Secondary | ICD-10-CM | POA: Diagnosis not present

## 2022-05-09 LAB — POCT URINALYSIS DIPSTICK
Bilirubin, UA: NEGATIVE
Blood, UA: NEGATIVE
Glucose, UA: NEGATIVE
Ketones, UA: NEGATIVE
Leukocytes, UA: NEGATIVE
Nitrite, UA: NEGATIVE
Odor: NEGATIVE
Protein, UA: NEGATIVE
Spec Grav, UA: 1.015 (ref 1.010–1.025)
Urobilinogen, UA: 0.2 E.U./dL
pH, UA: 7 (ref 5.0–8.0)

## 2022-05-09 LAB — POCT URINE PREGNANCY: Preg Test, Ur: NEGATIVE

## 2022-05-09 MED ORDER — CELECOXIB 100 MG PO CAPS: 100.0000 mg | ORAL_CAPSULE | Freq: Two times a day (BID) | ORAL | 0 refills | Status: DC

## 2022-05-09 MED ORDER — DOXYCYCLINE HYCLATE 100 MG PO CAPS: 100.0000 mg | ORAL_CAPSULE | Freq: Two times a day (BID) | ORAL | 0 refills | Status: AC

## 2022-05-09 MED ORDER — CYCLOBENZAPRINE HCL 10 MG PO TABS: 10.0000 mg | ORAL_TABLET | Freq: Every evening | ORAL | 2 refills | Status: DC | PRN

## 2022-05-09 NOTE — Progress Notes (Signed)
Pt presents for no improvement of pelvic pain. She took OCP for 2 days but could not keep them down.  No longer bleeding. VB began in Sept and ended 05/06/22. Negative pap 09/07/21

## 2022-05-09 NOTE — Progress Notes (Signed)
GYNECOLOGY PATIENT Patient name: Heather Fox MRN [id number]  Date of birth: 1995/07/29 Chief Complaint:   No chief complaint on file.     History:  Heather Fox is a 26 y.o. [id number] being seen today for pelvic pain.    States pain started with abnormal bleeding in September. From September to about this past Friday has had frequent and irregular bleeding. After 2 weeks of bleeding presented to ED and then had outpt visits for pain.  Reports being told that lining of the uterus was very thick, and so given medication to thin it out.  Completed course of medication, Megace, with some improvement in bleeding.  Reports taking birth control pills for 2 to 3 days and stopped due to intolerable nausea.  After a different outpatient visit diagnosed bacterial vaginosis, for which she started with pills but unable to tolerate, then completed gel treatment.  Reports pain was initially contraction-like.  Unable to use tampons and had pain with insertion of gel.  Prior to current episode of pain and bleeding no prior issues of pain including with menses or with intercourse.  Previously not using tampons for bleeding, typically uses pads.  Lifelong history of reported irregular cycles.  Prior forms of contraception including Nexplanon, pills, NuvaRing, patch.  All CHC's were used during time of Nexplanon for irregular bleeding, and were all stopped and Nexplanon removed.  Reports pregnancy after Nexplanon removal.  States that without hormones her menses may last from 4 to up to 10 days.  Patient reports lifelong history of constipation, for which she is not currently taking any medications.  Last bowel movement was earlier today.  Enter patient reports having had 2 pregnancies, 1 delivery, one of the medication abortion, uncomplicated.  Has no other history of pelvic infection this year, reports having trichomonas last year.  Intermittent condom use currently. Other pain co-morbidity of migraines.    Hx of endometriosis in mother. Questions about FMLA paperwork, told previously could be used for pain; currently working from home.     Gynecologic History Patient's last menstrual period was 02/16/2022. Contraception: condoms Last Pap: 08/2021. Result was normal with negative HPV Last Mammogram: n/a Last Colonoscopy: n/a  Obstetric History OB History  Gravida Para Term Preterm AB Living  4 2 2   2 2   SAB IAB Ectopic Multiple Live Births    2   0 2    # Outcome Date GA Lbr Len/2nd Weight Sex Delivery Anes PTL Lv  4 Term 01/11/15 [redacted]w[redacted]d 15:01 / 00:32 6 lb 10.9 oz (3.03 kg) M Vag-Spont EPI  LIV  3 Term 05/21/09    F Vag-Spont  N LIV  2 IAB      TAB     1 IAB      TAB       Past Medical History:  Diagnosis Date   GERD (gastroesophageal reflux disease)    Migraine     Past Surgical History:  Procedure Laterality Date   NO PAST SURGERIES     WISDOM TOOTH EXTRACTION      Current Outpatient Medications on File Prior to Visit  Medication Sig Dispense Refill   Galcanezumab-gnlm (EMGALITY) 120 MG/ML SOAJ Inject 1 Pen into the skin every 30 (thirty) days. 1.12 mL 6   Rimegepant Sulfate (NURTEC) 75 MG TBDP Take 75 mg by mouth as needed (for migraines). Max dose 1 pill in 24 hours 8 tablet 6   metroNIDAZOLE (FLAGYL) 500 MG tablet Take 1 tablet (500 mg  total) by mouth 2 (two) times daily. (Patient not taking: Reported on 05/09/2022) 14 tablet 0   metroNIDAZOLE (METROGEL) 0.75 % vaginal gel Place 1 Applicatorful vaginally at bedtime. Apply one applicatorful to vagina at bedtime for 5 days (Patient not taking: Reported on 05/09/2022) 70 g 1   norethindrone (AYGESTIN) 5 MG tablet Take 2 tablets (10 mg total) by mouth daily. (Patient not taking: Reported on 05/09/2022) 30 tablet 2   norethindrone-ethinyl estradiol (OVCON-35) 0.4-35 MG-MCG tablet Take 3 tabletsdaily the first 3 days, followed by 2 tablets daily the next 3 days and 1 tablet daily until end of active pills, skipping the sugar  pills (Patient not taking: Reported on 05/09/2022) 28 tablet 0   norethindrone-ethinyl estradiol (OVCON-35) 0.4-35 MG-MCG tablet Take 1 tablet by mouth daily. (Patient not taking: Reported on 05/09/2022) 28 tablet 11   oxyCODONE-acetaminophen (PERCOCET/ROXICET) 5-325 MG tablet Take 1 tablet by mouth every 4 (four) hours as needed. (Patient not taking: Reported on 05/09/2022) 30 tablet 0   promethazine (PHENERGAN) 25 MG tablet Take 1 tablet (25 mg total) by mouth every 6 (six) hours as needed for nausea or vomiting. (Patient not taking: Reported on 05/09/2022) 30 tablet 0   No current facility-administered medications on file prior to visit.    Allergies  Allergen Reactions   Naproxen Other (See Comments)    Swollen Throat    Social History:  reports that she has been smoking cigarettes. She has been smoking an average of .25 packs per day. She has never used smokeless tobacco. She reports current drug use. Drug: Marijuana. She reports that she does not drink alcohol.  Family History  Problem Relation Age of Onset   Stroke Mother    Cancer Neg Hx    Diabetes Neg Hx    Heart disease Neg Hx    Hypertension Neg Hx    Hearing loss Neg Hx     The following portions of the patient's history were reviewed and updated as appropriate: allergies, current medications, past family history, past medical history, past social history, past surgical history and problem list.  Review of Systems Pertinent items noted in HPI and remainder of comprehensive ROS otherwise negative.  Physical Exam:  BP 124/78   Pulse (!) 102   Wt 157 lb 8 oz (71.4 kg)   LMP 02/16/2022 Comment: Bleeding subsided 05/06/22  BMI 26.21 kg/m  Physical Exam Vitals and nursing note reviewed. Exam conducted with a chaperone present.  Constitutional:      Appearance: Normal appearance.  Cardiovascular:     Rate and Rhythm: Normal rate.  Pulmonary:     Effort: Pulmonary effort is normal.     Breath sounds: Normal breath  sounds.  Abdominal:     Palpations: Abdomen is soft.     Comments: + carnett in bilateral lower quadrants, mostly suprapubic  Genitourinary:    Comments: Normal appearing vulva Normal vulvar sensation bilaterally Nontender superficial pelvic floor muscles Nontender ischial tuberosities bilaterally  Allodynia at introitus: No  Anal wink present Posterior vaginal wall tender Kegel 1/5 Right levator ani 8/10 Right obturator internus 10/10 Left levator ani 8/10 Left obturator internus 10/10 Anterior vaginal wall nontender Uterine tenderness tender Neurological:     General: No focal deficit present.     Mental Status: She is alert and oriented to person, place, and time.  Psychiatric:        Mood and Affect: Mood normal.        Behavior: Behavior normal.  Thought Content: Thought content normal.        Judgment: Judgment normal.     Assessment and Plan:   1. Pelvic pain Abdominopelvic myalgia on exam possibly due to longstanding constipation and flare triggered by PID/acute change in bleeding. Referral to PFPT and start muscle relaxer. Documented allergy naproxen, states longstanding use of ibuprofen but stopped due to no longer working for her migraines.  - POCT Urinalysis Dipstick - POCT urine pregnancy - Ambulatory referral to Physical Therapy - cyclobenzaprine (FLEXERIL) 10 MG tablet; Take 1 tablet (10 mg total) by mouth at bedtime as needed for muscle spasms.  Dispense: 30 tablet; Refill: 2  2. PID (pelvic inflammatory disease) Possible PID, completed course of metrogel. Doxy x2 weeks and assess bleeding.  - doxycycline (VIBRAMYCIN) 100 MG capsule; Take 1 capsule (100 mg total) by mouth 2 (two) times daily for 14 days.  Dispense: 28 capsule; Refill: 0  Routine preventative health maintenance measures emphasized. Please refer to After Visit Summary for other counseling recommendations.   Follow-up: No follow-ups on file.      Lorriane Shire, MD Obstetrician  & Gynecologist, [facility] [employer], [facility]

## 2022-05-11 ENCOUNTER — Other Ambulatory Visit: Payer: Self-pay | Admitting: *Deleted

## 2022-05-11 NOTE — Progress Notes (Signed)
FMLA forms completed and faxed

## 2022-05-18 ENCOUNTER — Telehealth: Payer: Self-pay

## 2022-05-18 NOTE — Telephone Encounter (Signed)
fmla 

## 2022-05-27 ENCOUNTER — Emergency Department (HOSPITAL_BASED_OUTPATIENT_CLINIC_OR_DEPARTMENT_OTHER): Payer: Medicaid Other

## 2022-05-27 ENCOUNTER — Encounter (HOSPITAL_BASED_OUTPATIENT_CLINIC_OR_DEPARTMENT_OTHER): Payer: Self-pay | Admitting: Emergency Medicine

## 2022-05-27 ENCOUNTER — Other Ambulatory Visit: Payer: Self-pay

## 2022-05-27 ENCOUNTER — Emergency Department (HOSPITAL_BASED_OUTPATIENT_CLINIC_OR_DEPARTMENT_OTHER)
Admission: EM | Admit: 2022-05-27 | Discharge: 2022-05-27 | Disposition: A | Discharge status: Home / Self Care | Payer: Medicaid Other | Attending: Emergency Medicine | Admitting: Emergency Medicine

## 2022-05-27 DIAGNOSIS — D72829 Elevated white blood cell count, unspecified: Secondary | ICD-10-CM | POA: Insufficient documentation

## 2022-05-27 DIAGNOSIS — F1290 Cannabis use, unspecified, uncomplicated: Secondary | ICD-10-CM | POA: Diagnosis not present

## 2022-05-27 DIAGNOSIS — R102 Pelvic and perineal pain: Secondary | ICD-10-CM | POA: Diagnosis present

## 2022-05-27 DIAGNOSIS — D649 Anemia, unspecified: Secondary | ICD-10-CM | POA: Diagnosis not present

## 2022-05-27 DIAGNOSIS — N200 Calculus of kidney: Secondary | ICD-10-CM

## 2022-05-27 DIAGNOSIS — R112 Nausea with vomiting, unspecified: Secondary | ICD-10-CM

## 2022-05-27 DIAGNOSIS — N132 Hydronephrosis with renal and ureteral calculous obstruction: Secondary | ICD-10-CM | POA: Diagnosis not present

## 2022-05-27 LAB — CBC WITH DIFFERENTIAL/PLATELET
Abs Immature Granulocytes: 0.03 10*3/uL (ref 0.00–0.07)
Basophils Absolute: 0 10*3/uL (ref 0.0–0.1)
Basophils Relative: 0 %
Eosinophils Absolute: 0 10*3/uL (ref 0.0–0.5)
Eosinophils Relative: 0 %
HCT: 34.1 % — ABNORMAL LOW (ref 36.0–46.0)
Hemoglobin: 10.7 g/dL — ABNORMAL LOW (ref 12.0–15.0)
Immature Granulocytes: 0 %
Lymphocytes Relative: 19 %
Lymphs Abs: 2.4 10*3/uL (ref 0.7–4.0)
MCH: 27 pg (ref 26.0–34.0)
MCHC: 31.4 g/dL (ref 30.0–36.0)
MCV: 86.1 fL (ref 80.0–100.0)
Monocytes Absolute: 0.9 10*3/uL (ref 0.1–1.0)
Monocytes Relative: 7 %
Neutro Abs: 9.3 10*3/uL — ABNORMAL HIGH (ref 1.7–7.7)
Neutrophils Relative %: 74 %
Platelets: 298 10*3/uL (ref 150–400)
RBC: 3.96 MIL/uL (ref 3.87–5.11)
RDW: 13.5 % (ref 11.5–15.5)
WBC: 12.7 10*3/uL — ABNORMAL HIGH (ref 4.0–10.5)
nRBC: 0 % (ref 0.0–0.2)

## 2022-05-27 LAB — WET PREP, GENITAL
Clue Cells Wet Prep HPF POC: NONE SEEN
Sperm: NONE SEEN
Trich, Wet Prep: NONE SEEN
WBC, Wet Prep HPF POC: <10 (ref <10)
Yeast Wet Prep HPF POC: NONE SEEN

## 2022-05-27 LAB — URINALYSIS, ROUTINE W REFLEX MICROSCOPIC
Bacteria, UA: NONE SEEN
Bilirubin Urine: NEGATIVE
Glucose, UA: NEGATIVE mg/dL
Ketones, ur: NEGATIVE mg/dL
Leukocytes,Ua: NEGATIVE
Nitrite: NEGATIVE
Protein, ur: 30 mg/dL — AB
RBC / HPF: >50 RBC/hpf — ABNORMAL HIGH (ref 0–5)
Specific Gravity, Urine: >1.046 — ABNORMAL HIGH (ref 1.005–1.030)
pH: 7.5 (ref 5.0–8.0)

## 2022-05-27 LAB — HEPATIC FUNCTION PANEL
ALT: 18 U/L (ref 0–44)
AST: 29 U/L (ref 15–41)
Albumin: 4.7 g/dL (ref 3.5–5.0)
Alkaline Phosphatase: 67 U/L (ref 38–126)
Bilirubin, Direct: 0.1 mg/dL (ref 0.0–0.2)
Indirect Bilirubin: 0.1 mg/dL — ABNORMAL LOW (ref 0.3–0.9)
Total Bilirubin: 0.2 mg/dL — ABNORMAL LOW (ref 0.3–1.2)
Total Protein: 8.1 g/dL (ref 6.5–8.1)

## 2022-05-27 LAB — BASIC METABOLIC PANEL
Anion gap: 12 (ref 5–15)
BUN: 12 mg/dL (ref 6–20)
CO2: 22 mmol/L (ref 22–32)
Calcium: 9.8 mg/dL (ref 8.9–10.3)
Chloride: 104 mmol/L (ref 98–111)
Creatinine, Ser: 0.87 mg/dL (ref 0.44–1.00)
GFR, Estimated: >60 mL/min (ref >60)
Glucose, Bld: 115 mg/dL — ABNORMAL HIGH (ref 70–99)
Potassium: 4.2 mmol/L (ref 3.5–5.1)
Sodium: 138 mmol/L (ref 135–145)

## 2022-05-27 LAB — HIV ANTIBODY (ROUTINE TESTING W REFLEX): HIV Screen 4th Generation wRfx: NONREACTIVE

## 2022-05-27 LAB — RAPID URINE DRUG SCREEN, HOSP PERFORMED
Amphetamines: NOT DETECTED
Barbiturates: NOT DETECTED
Benzodiazepines: NOT DETECTED
Cocaine: NOT DETECTED
Opiates: NOT DETECTED
Tetrahydrocannabinol: POSITIVE — AB

## 2022-05-27 LAB — HCG, SERUM, QUALITATIVE: Preg, Serum: NEGATIVE

## 2022-05-27 LAB — I-STAT CREATININE, ED: Creatinine, Ser: 1.8 mg/dL — ABNORMAL HIGH (ref 0.44–1.00)

## 2022-05-27 LAB — LIPASE, BLOOD: Lipase: 34 U/L (ref 11–51)

## 2022-05-27 MED ORDER — DROPERIDOL 2.5 MG/ML IJ SOLN
2.5000 mg | Freq: Once | INTRAMUSCULAR | Status: AC
  Administered 2022-05-27: 2.5 mg via INTRAVENOUS
  Filled 2022-05-27: qty 2

## 2022-05-27 MED ORDER — KETOROLAC TROMETHAMINE 15 MG/ML IJ SOLN
15.0000 mg | Freq: Once | INTRAMUSCULAR | Status: AC
  Administered 2022-05-27: 15 mg via INTRAVENOUS
  Filled 2022-05-27: qty 1

## 2022-05-27 MED ORDER — IOHEXOL 300 MG/ML  SOLN
100.0000 mL | Freq: Once | INTRAMUSCULAR | Status: AC | PRN
  Administered 2022-05-27: 75 mL via INTRAVENOUS

## 2022-05-27 MED ORDER — FENTANYL CITRATE PF 50 MCG/ML IJ SOSY
50.0000 ug | PREFILLED_SYRINGE | Freq: Once | INTRAMUSCULAR | Status: AC
  Administered 2022-05-27: 50 ug via INTRAVENOUS
  Filled 2022-05-27: qty 1

## 2022-05-27 MED ORDER — HYDROMORPHONE HCL 1 MG/ML IJ SOLN
1.0000 mg | Freq: Once | INTRAMUSCULAR | Status: AC
  Administered 2022-05-27: 1 mg via INTRAVENOUS
  Filled 2022-05-27: qty 1

## 2022-05-27 MED ORDER — ONDANSETRON HCL 4 MG/2ML IJ SOLN
4.0000 mg | Freq: Once | INTRAMUSCULAR | Status: AC
  Administered 2022-05-27: 4 mg via INTRAVENOUS
  Filled 2022-05-27: qty 2

## 2022-05-27 MED ORDER — TAMSULOSIN HCL 0.4 MG PO CAPS: 0.4000 mg | ORAL_CAPSULE | Freq: Every day | ORAL | 0 refills | Status: AC

## 2022-05-27 MED ORDER — FENTANYL CITRATE PF 50 MCG/ML IJ SOSY
50.0000 ug | PREFILLED_SYRINGE | INTRAMUSCULAR | Status: DC | PRN
  Administered 2022-05-27: 50 ug via INTRAVENOUS
  Filled 2022-05-27: qty 1

## 2022-05-27 MED ORDER — LACTATED RINGERS IV BOLUS
1000.0000 mL | Freq: Once | INTRAVENOUS | Status: AC
  Administered 2022-05-27: 1000 mL via INTRAVENOUS

## 2022-05-27 NOTE — ED Notes (Signed)
I-stat serum creatinine is erroneous, please cancel/ discontinue/ credit. D/w EDP Dr. Armandina Gemma.

## 2022-05-27 NOTE — ED Notes (Signed)
EDP at [facility] with chaperone for pelvic exam

## 2022-05-27 NOTE — ED Notes (Signed)
EDP into room, at [facility] speaking with pt and family.

## 2022-05-27 NOTE — ED Notes (Signed)
Pt to CT

## 2022-05-27 NOTE — ED Triage Notes (Signed)
  Patient comes in with lower abdominal pain that started yesterday.  Patient states she has had issues with vaginal bleeding over the last 3 months but has no bleeding today.  Patient endorses nausea/vomiting, and weakness in legs.  Patient diaphoretic, and screaming in triage stating she feels like she is going to pass out.  Pain 10/10, sharp.

## 2022-05-27 NOTE — ED Notes (Signed)
Pt stated that the pain medicine is not working. Informed John - RN.

## 2022-05-27 NOTE — ED Notes (Signed)
EDP at [facility] 

## 2022-05-27 NOTE — ED Provider Notes (Signed)
[facility] EMERGENCY DEPT Provider Note   CSN: [id number] Arrival date & time: 05/27/22  8299     History  Chief Complaint  Patient presents with   Abdominal Pain    Heather Fox is a 27 y.o. female.   Abdominal Pain Associated symptoms: nausea and vomiting   Associated symptoms: no dysuria, no vaginal bleeding and no vaginal discharge      27 year old female presenting to the emergency department with lower pelvic pain and abdominal pain nausea and vomiting.  The patient states that she has had intermittent problems with abdominal pain over the past several months.  She was found to have abnormal uterine bleeding and a thickened endometrium on ultrasound on 03/03/2022 during a visit to the emergency department.  She was found to have a hemorrhagic cyst on ultrasound during that time.  For her thickened endometrium and abnormal uterine bleeding, she was given Megace with some improvement ultimately had persistent bleeding.  She states that she had bleeding persistently for 3 months.  Follow-up ultrasound outpatient and showed resolution of her cyst and thickened endometrium.  She followed back outpatient with an OB/GYN on 04/24/2022 with persistent dysmenorrhea and pelvic pain.  She has a family history of endometriosis in her mother.  She was referred to Dr. Briscoe Deutscher after an [facility] taper who she saw in clinic on 05/09/2022.  She had been previously diagnosed with bacterial vaginosis outpatient and had been prescribed metronidazole gel.  She was prescribed a muscle relaxer for abdominopelvic myalgia which she states did not help.  It was thought that she could have PID and she completed a course of MetroGel and was prescribed doxycycline for 2 weeks.  Patient states that the most recent episode of pain came on over the past few days.  The pain has been contraction-like in waves.  She endorses a bilateral nature to the pain. No focality to the pain. Endorses increased  frequency, no dysuria.  She denies any active vaginal bleeding.  She denies any abnormal discharge.  She denies any fevers or chills.  She endorses persistent nausea and vomiting which is uncontrolled.  She consents to STI testing.  She does endorse THC use.  Unable to keep anything down.  She states that her vomitus has now turned pink and bloody.   Home Medications Prior to Admission medications   Medication Sig Start Date End Date Taking? Authorizing Provider  tamsulosin (FLOMAX) 0.4 MG CAPS capsule Take 1 capsule (0.4 mg total) by mouth daily for 10 days. 05/27/22 06/06/22 Yes Ernie Avena, MD  celecoxib (CELEBREX) 100 MG capsule Take 1 capsule (100 mg total) by mouth 2 (two) times daily. 05/09/22   Lorriane Shire, MD  cyclobenzaprine (FLEXERIL) 10 MG tablet Take 1 tablet (10 mg total) by mouth at bedtime as needed for muscle spasms. 05/09/22   Lorriane Shire, MD  Galcanezumab-gnlm (EMGALITY) 120 MG/ML SOAJ Inject 1 Pen into the skin every 30 (thirty) days. 01/30/22   Ocie Doyne, MD  metroNIDAZOLE (FLAGYL) 500 MG tablet Take 1 tablet (500 mg total) by mouth 2 (two) times daily. Patient not taking: Reported on 05/09/2022 04/25/22   Constant, Peggy, MD  metroNIDAZOLE (METROGEL) 0.75 % vaginal gel Place 1 Applicatorful vaginally at bedtime. Apply one applicatorful to vagina at bedtime for 5 days Patient not taking: Reported on 05/09/2022 05/01/22   Constant, Peggy, MD  norethindrone (AYGESTIN) 5 MG tablet Take 2 tablets (10 mg total) by mouth daily. Patient not taking: Reported on 05/09/2022 04/18/22   Alysia Penna,  Marolyn Hammock, MD  norethindrone-ethinyl estradiol Fransisca Connors) 0.4-35 MG-MCG tablet Take 3 tabletsdaily the first 3 days, followed by 2 tablets daily the next 3 days and 1 tablet daily until end of active pills, skipping the sugar pills Patient not taking: Reported on 05/09/2022 04/24/22   Constant, Peggy, MD  norethindrone-ethinyl estradiol (OVCON-35) 0.4-35 MG-MCG tablet Take 1 tablet by  mouth daily. Patient not taking: Reported on 05/09/2022 04/24/22   Constant, Peggy, MD  oxyCODONE-acetaminophen (PERCOCET/ROXICET) 5-325 MG tablet Take 1 tablet by mouth every 4 (four) hours as needed. Patient not taking: Reported on 05/09/2022 04/18/22   Hermina Staggers, MD  promethazine (PHENERGAN) 25 MG tablet Take 1 tablet (25 mg total) by mouth every 6 (six) hours as needed for nausea or vomiting. Patient not taking: Reported on 05/09/2022 03/03/21   Adam Phenix, MD  Rimegepant Sulfate (NURTEC) 75 MG TBDP Take 75 mg by mouth as needed (for migraines). Max dose 1 pill in 24 hours 01/30/22   Ocie Doyne, MD      Allergies    Naproxen    Review of Systems   Review of Systems  Gastrointestinal:  Positive for abdominal pain, nausea and vomiting.  Genitourinary:  Positive for frequency and pelvic pain. Negative for dysuria, vaginal bleeding, vaginal discharge and vaginal pain.  All other systems reviewed and are negative.   Physical Exam Updated Vital Signs BP 112/72 (BP Location: Right Arm)   Pulse 98   Temp 97.7 F (36.5 C) (Oral)   Resp 16   Ht 5\' 5"  (1.651 m)   Wt 71.2 kg   LMP 02/16/2022 Comment: Bleeding subsided 05/06/22  SpO2 99%   BMI 26.13 kg/m  Physical Exam Vitals and nursing note reviewed. Exam conducted with a chaperone present.  Constitutional:      General: She is in acute distress.     Appearance: She is well-developed. She is ill-appearing.     Comments: Acute distress, actively retching  HENT:     Head: Normocephalic and atraumatic.  Eyes:     Conjunctiva/sclera: Conjunctivae normal.  Cardiovascular:     Rate and Rhythm: Normal rate and regular rhythm.     Heart sounds: No murmur heard. Pulmonary:     Effort: Pulmonary effort is normal. No respiratory distress.     Breath sounds: Normal breath sounds.  Abdominal:     Palpations: Abdomen is soft.     Tenderness: There is abdominal tenderness in the right lower quadrant, suprapubic area and  left lower quadrant. There is guarding. There is no right CVA tenderness or left CVA tenderness.  Genitourinary:    Vagina: Normal. No bleeding.     Cervix: No cervical motion tenderness, discharge or cervical bleeding.     Uterus: Tender.      Adnexa: Right adnexa normal and left adnexa normal.  Musculoskeletal:        General: No swelling.     Cervical back: Neck supple.  Skin:    General: Skin is warm and dry.     Capillary Refill: Capillary refill takes less than 2 seconds.  Neurological:     Mental Status: She is alert.  Psychiatric:        Mood and Affect: Mood normal.     ED Results / Procedures / Treatments   Labs (all labs ordered are listed, but only abnormal results are displayed) Labs Reviewed  CBC WITH DIFFERENTIAL/PLATELET - Abnormal; Notable for the following components:      Result Value   WBC  12.7 (*)    Hemoglobin 10.7 (*)    HCT 34.1 (*)    Neutro Abs 9.3 (*)    All other components within normal limits  BASIC METABOLIC PANEL - Abnormal; Notable for the following components:   Glucose, Bld 115 (*)    All other components within normal limits  HEPATIC FUNCTION PANEL - Abnormal; Notable for the following components:   Total Bilirubin 0.2 (*)    Indirect Bilirubin 0.1 (*)    All other components within normal limits  URINALYSIS, ROUTINE W REFLEX MICROSCOPIC - Abnormal; Notable for the following components:   Specific Gravity, Urine >1.046 (*)    Hgb urine dipstick MODERATE (*)    Protein, ur 30 (*)    RBC / HPF >50 (*)    All other components within normal limits  RAPID URINE DRUG SCREEN, HOSP PERFORMED - Abnormal; Notable for the following components:   Tetrahydrocannabinol POSITIVE (*)    All other components within normal limits  I-STAT CREATININE, ED - Abnormal; Notable for the following components:   Creatinine, Ser 1.80 (*)    All other components within normal limits  WET PREP, GENITAL  LIPASE, BLOOD  HCG, SERUM, QUALITATIVE  HIV ANTIBODY  (ROUTINE TESTING W REFLEX)  RPR  GC/CHLAMYDIA PROBE AMP ([place]) NOT AT [facility]    EKG EKG Interpretation  Date/Time:  Saturday May 27 2022 08:11:24 EST Ventricular Rate:  109 PR Interval:  156 QRS Duration: 89 QT Interval:  334 QTC Calculation: 450 R Axis:   54 Text Interpretation: Sinus tachycardia Borderline T abnormalities, anterior leads Confirmed by Regan Lemming ([id number]) on 05/27/2022 8:34:14 AM  Radiology US PELVIC COMPLETE W TRANSVAGINAL AND TORSION R/O  Result Date: 05/27/2022 CLINICAL DATA:  Pelvic pain with dysfunctional uterine bleeding. EXAM: TRANSABDOMINAL AND TRANSVAGINAL ULTRASOUND OF PELVIS TECHNIQUE: Both transabdominal and transvaginal ultrasound examinations of the pelvis were performed. Transabdominal technique was performed for global imaging of the pelvis including uterus, ovaries, adnexal regions, and pelvic cul-de-sac. It was necessary to proceed with endovaginal exam following the transabdominal exam to visualize the endometrium. COMPARISON:  CT scan from earlier same day FINDINGS: Uterus Measurements: 9.3 x 4.2 x 5.4 cm = volume: 111 mL. No fibroids or other mass visualized. Endometrium Thickness: 6 mm.  No focal abnormality visualized. Right ovary Measurements: 4.3 x 2.8 x 2.7 cm = volume: 17 mL. Normal appearance/no adnexal mass. Left ovary Measurements: 3.4 x 2.4 x 2.1 cm = volume: 14 mL. Dominant follicle measures 18 mm. No adnexal mass. Other findings Trace free fluid in the cul-de-sac. IMPRESSION: 1. Trace free fluid in the cul-de-sac, likely physiologic. 2. Otherwise unremarkable transabdominal and transvaginal pelvic ultrasound. Electronically Signed   By: Misty Stanley M.D.   On: 05/27/2022 13:12   CT ABDOMEN PELVIS W CONTRAST  Result Date: 05/27/2022 CLINICAL DATA:  Lower abdominal pain. Nausea and vomiting. Weakness. EXAM: CT ABDOMEN AND PELVIS WITH CONTRAST TECHNIQUE: Multidetector CT imaging of the abdomen and pelvis was performed using the standard  protocol following bolus administration of intravenous contrast. RADIATION DOSE REDUCTION: This exam was performed according to the departmental dose-optimization program which includes automated exposure control, adjustment of the mA and/or kV according to patient size and/or use of iterative reconstruction technique. CONTRAST:  61mL OMNIPAQUE IOHEXOL 300 MG/ML  SOLN COMPARISON:  None Available. FINDINGS: Lower chest: No acute abnormality. Hepatobiliary: No focal liver abnormality is seen. No gallstones, gallbladder wall thickening, or biliary dilatation. Pancreas: Unremarkable. No pancreatic ductal dilatation or surrounding inflammatory changes. Spleen:  Normal in size without focal abnormality. Adrenals/Urinary Tract: There is mild right hydronephrosis. There is decreased attenuation in the right kidney relative to the left. No definitive striated nephrogram. Mild perinephric fluid/stranding identified. At least 2 tiny stones are identified in the right kidney with the largest measuring 2 mm. There is right ureterectasis, extending into the pelvis. The distal right ureter is difficult to visualize. There are multiple calcifications in the pelvis, most of which are definitively phleboliths. There is a calcification in the right pelvis which could represent a phlebolith versus a distal right ureteral stone measuring 2.1 mm. A single stone is identified in the left kidney measuring approximately 3 mm. The left kidney is otherwise normal no hydronephrosis or perinephric stranding. The left ureter is normal. The bladder is unremarkable. The adrenal glands are normal. Stomach/Bowel: Stomach and small bowel are normal. The colon is normal. Visualized portions of the appendix are normal. The distal appendix is not well visualized but there is no secondary evidence of appendicitis. Vascular/Lymphatic: No significant vascular findings are present. No enlarged abdominal or pelvic lymph nodes. Reproductive: Uterus and  bilateral adnexa are unremarkable. Other: No abdominal wall hernia or abnormality. No abdominopelvic ascites. Musculoskeletal: No acute or significant osseous findings. IMPRESSION: 1. There is mild right hydronephrosis and perinephric stranding. There is decreased attenuation in the right kidney relative to the left. There is right ureterectasis. There is a calcification in the right side of the pelvis which could represent a distal ureteral stone measuring 2.1 mm versus a phlebolith. The constellation of findings could represent a small distal ureteral stone or a recently passed stone. The decreased attenuation in the right kidney could be due to obstruction. Early pyelonephritis not excluded although there is no definitive striated nephrogram at this time. Recommend clinical correlation and correlation with urinalysis. 2. There are 2 tiny stones in the right kidney with the largest measuring 2 mm. 3. There is a single stone in the left kidney measuring 3 or 4 mm. 4. No other abnormalities. Electronically Signed   By: Gerome Samavid  Williams III M.D.   On: 05/27/2022 09:19    Procedures Procedures    Medications Ordered in ED Medications  lactated ringers bolus 1,000 mL (0 mLs Intravenous Stopped 05/27/22 0949)  droperidol (INAPSINE) 2.5 MG/ML injection 2.5 mg (2.5 mg Intravenous Given 05/27/22 0738)  fentaNYL (SUBLIMAZE) injection 50 mcg (50 mcg Intravenous Given 05/27/22 0739)  iohexol (OMNIPAQUE) 300 MG/ML solution 100 mL (75 mLs Intravenous Contrast Given 05/27/22 0854)  ondansetron (ZOFRAN) injection 4 mg (4 mg Intravenous Given 05/27/22 0949)  HYDROmorphone (DILAUDID) injection 1 mg (1 mg Intravenous Given 05/27/22 0948)  ketorolac (TORADOL) 15 MG/ML injection 15 mg (15 mg Intravenous Given 05/27/22 1316)    ED Course/ Medical Decision Making/ A&P Clinical Course as of 05/28/22 [id number]  Sat May 27, 2022  1013 Tetrahydrocannabinol(!): POSITIVE [JL]    Clinical Course User Index [JL] Ernie AvenaLawsing, Breanah Faddis, MD                            Medical Decision Making Amount and/or Complexity of Data Reviewed Labs: ordered. Decision-making details documented in ED Course. Radiology: ordered.  Risk Prescription drug management.   27 year old female presenting to the emergency department with lower pelvic pain and abdominal pain nausea and vomiting.  The patient states that she has had intermittent problems with abdominal pain over the past several months.  She was found to have abnormal uterine bleeding and a thickened  endometrium on ultrasound on 03/03/2022 during a visit to the emergency department.  She was found to have a hemorrhagic cyst on ultrasound during that time.  For her thickened endometrium and abnormal uterine bleeding, she was given Megace with some improvement ultimately had persistent bleeding.  She states that she had bleeding persistently for 3 months.  Follow-up ultrasound outpatient and showed resolution of her cyst and thickened endometrium.  She followed back outpatient with an OB/GYN on 04/24/2022 with persistent dysmenorrhea and pelvic pain.  She has a family history of endometriosis in her mother.  She was referred to Dr. Briscoe Deutscher after an [facility] taper who she saw in clinic on 05/09/2022.  She had been previously diagnosed with bacterial vaginosis outpatient and had been prescribed metronidazole gel.  She was prescribed a muscle relaxer for abdominopelvic myalgia which she states did not help.  It was thought that she could have PID and she completed a course of MetroGel and was prescribed doxycycline for 2 weeks.  Patient states that the most recent episode of pain came on over the past few days.  The pain has been contraction-like in waves.  She endorses a bilateral nature to the pain. No focality to the pain. Endorses increased frequency, no dysuria.  She denies any active vaginal bleeding.  She denies any abnormal discharge.  She denies any fevers or chills.  She endorses persistent nausea and vomiting  which is uncontrolled.  She consents to STI testing.  She does endorse THC use.  Unable to keep anything down.  She states that her vomitus has now turned pink and bloody.   On arrival, the patient was actively retching, in acute distress, afebrile, vitally stable, not tachycardic or tachypneic, BP 109/83, saturating 100% on room air.  Sinus tachycardia noted on cardiac telemetry on my evaluation.  Physical exam concerning for bilateral lower abdominal tenderness to palpation.  A pelvic exam was performed which revealed no gross vaginal bleeding, no cervical motion tenderness, no adnexal tenderness, no obvious pelvic discharge.  Differential diagnosis includes PID, nephrolithiasis, diverticulitis, SBO, UTI/pyelonephritis, STI, tubo-ovarian abscess, ovarian torsion, ectopic pregnancy, constipation, endometriosis, cannabis hyperemesis syndrome, cyclic vomiting syndrome.  Her evaluation significant for UDS positive for THC, urinalysis with hematuria, negative for UTI, BNP unremarkable, CBC with a mild leukocytosis to 12.7, mild anemia to 10.7, hepatic function panel unremarkable, wet prep unremarkable.  hCG was negative.  Lipase was normal.  CT abdomen pelvis revealed the following: IMPRESSION:  1. There is mild right hydronephrosis and perinephric stranding.  There is decreased attenuation in the right kidney relative to the  left. There is right ureterectasis. There is a calcification in the  right side of the pelvis which could represent a distal ureteral  stone measuring 2.1 mm versus a phlebolith. The constellation of  findings could represent a small distal ureteral stone or a recently  passed stone. The decreased attenuation in the right kidney could be  due to obstruction. Early pyelonephritis not excluded although there  is no definitive striated nephrogram at this time. Recommend  clinical correlation and correlation with urinalysis.  2. There are 2 tiny stones in the right kidney with the  largest  measuring 2 mm.  3. There is a single stone in the left kidney measuring 3 or 4 mm.  4. No other abnormalities.    Patient's symptoms are not focal to the right side, endorses bilateral symptoms.  Her CT imaging may be an incidental finding although she does have evidence of right-sided hydronephrosis  and nephrolithiasis.  Patient was administered multiple rounds of IV pain medication in addition to IV fluids and antiemetics.  Pelvic US was performed: IMPRESSION:  1. Trace free fluid in the cul-de-sac, likely physiologic.  2. Otherwise unremarkable transabdominal and transvaginal pelvic  ultrasound.    On repeat assessment, following Toradol administration and fluid resuscitation, the patient's pain had completely resolved.  I informed the patient of her laboratory and imaging workup.  Her most significant findings are on her CT imaging which are concerning for nephrolithiasis.  She has no evidence of UTI on urinalysis and had hematuria on urinalysis, she is afebrile.  She was initially tachycardic but this resolved with pain control.  She has a slight leukocytosis.  Given no evidence of UTI, will not treat for pyelonephritis.  Her wet prep was negative.  Low concern for PID at this time.  I discussed return precautions with the patient.  I advised that the patient follow-up outpatient with urology.  The patient endorsed understanding.  Stable for discharge at this time.  Advised NSAIDs and Flomax outpatient.   Final Clinical Impression(s) / ED Diagnoses Final diagnoses:  Episodic cannabis use  Nausea and vomiting, unspecified vomiting type  Pelvic pain  Nephrolithiasis    Rx / DC Orders ED Discharge Orders          Ordered    Ambulatory referral to Urology        05/27/22 1355    tamsulosin (FLOMAX) 0.4 MG CAPS capsule  Daily        05/27/22 1404              Ernie Avena, MD 05/28/22 [phone]

## 2022-05-27 NOTE — ED Notes (Addendum)
Pain med given. Pt calmer, resting, NAD, quiet, VSS. CT here for pt.

## 2022-05-27 NOTE — ED Notes (Signed)
Updated on plan and wait

## 2022-05-27 NOTE — Discharge Instructions (Addendum)
Your workup was overall reassuring with the exception of your CT which showed evidence of kidney stones.  Your symptoms could be very consistent with renal colic.  Recommend outpatient follow-up with a urologist.  Take NSAIDs for pain control and start Flomax.  Drink plenty of water.

## 2022-05-27 NOTE — ED Notes (Signed)
EDP into room, at [facility]. Family present.

## 2022-05-27 NOTE — ED Notes (Signed)
Alert, NAD, calm, interactive.  

## 2022-05-28 LAB — RPR: RPR Ser Ql: NONREACTIVE

## 2022-05-29 LAB — GC/CHLAMYDIA PROBE AMP (~~LOC~~) NOT AT ARMC
Chlamydia: NEGATIVE
Comment: NEGATIVE
Comment: NORMAL
Neisseria Gonorrhea: NEGATIVE

## 2022-05-30 ENCOUNTER — Ambulatory Visit: Payer: Medicaid Other | Admitting: Physical Therapy

## 2022-05-30 ENCOUNTER — Ambulatory Visit (HOSPITAL_COMMUNITY)
Admission: AD | Admit: 2022-05-30 | Discharge: 2022-05-30 | Disposition: A | Discharge status: Home / Self Care | Payer: Medicaid Other | Source: Ambulatory Visit | Attending: Urology | Admitting: Urology

## 2022-05-30 ENCOUNTER — Other Ambulatory Visit: Payer: Self-pay | Admitting: Urology

## 2022-05-30 ENCOUNTER — Inpatient Hospital Stay (HOSPITAL_COMMUNITY): Payer: Medicaid Other

## 2022-05-30 ENCOUNTER — Inpatient Hospital Stay (HOSPITAL_BASED_OUTPATIENT_CLINIC_OR_DEPARTMENT_OTHER): Payer: Medicaid Other | Admitting: Anesthesiology

## 2022-05-30 ENCOUNTER — Inpatient Hospital Stay (HOSPITAL_COMMUNITY): Payer: Medicaid Other | Admitting: Anesthesiology

## 2022-05-30 ENCOUNTER — Other Ambulatory Visit: Payer: Self-pay

## 2022-05-30 ENCOUNTER — Encounter (HOSPITAL_COMMUNITY)
Admission: AD | Disposition: A | Discharge status: Home / Self Care | Payer: Self-pay | Source: Ambulatory Visit | Attending: Urology

## 2022-05-30 ENCOUNTER — Encounter (HOSPITAL_COMMUNITY): Payer: Self-pay | Admitting: Urology

## 2022-05-30 DIAGNOSIS — K219 Gastro-esophageal reflux disease without esophagitis: Secondary | ICD-10-CM | POA: Insufficient documentation

## 2022-05-30 DIAGNOSIS — N132 Hydronephrosis with renal and ureteral calculous obstruction: Secondary | ICD-10-CM | POA: Diagnosis not present

## 2022-05-30 DIAGNOSIS — N201 Calculus of ureter: Secondary | ICD-10-CM | POA: Diagnosis not present

## 2022-05-30 DIAGNOSIS — F17200 Nicotine dependence, unspecified, uncomplicated: Secondary | ICD-10-CM | POA: Insufficient documentation

## 2022-05-30 DIAGNOSIS — Z793 Long term (current) use of hormonal contraceptives: Secondary | ICD-10-CM | POA: Diagnosis not present

## 2022-05-30 HISTORY — PX: CYSTOSCOPY WITH RETROGRADE PYELOGRAM, URETEROSCOPY AND STENT PLACEMENT: SHX5789

## 2022-05-30 SURGERY — CYSTOURETEROSCOPY, WITH RETROGRADE PYELOGRAM AND STENT INSERTION
Anesthesia: General | Laterality: Right

## 2022-05-30 MED ORDER — ACETAMINOPHEN 10 MG/ML IV SOLN
INTRAVENOUS | Status: DC | PRN
  Administered 2022-05-30: 1000 mg via INTRAVENOUS

## 2022-05-30 MED ORDER — ORAL CARE MOUTH RINSE: 15.0000 mL | Freq: Once | OROMUCOSAL | Status: AC

## 2022-05-30 MED ORDER — SODIUM CHLORIDE 0.9 % IR SOLN
Status: DC | PRN
  Administered 2022-05-30: 3000 mL

## 2022-05-30 MED ORDER — FENTANYL CITRATE PF 50 MCG/ML IJ SOSY: 25.0000 ug | PREFILLED_SYRINGE | INTRAMUSCULAR | Status: DC | PRN

## 2022-05-30 MED ORDER — ONDANSETRON HCL 4 MG/2ML IJ SOLN
INTRAMUSCULAR | Status: DC | PRN
  Administered 2022-05-30: 4 mg via INTRAVENOUS

## 2022-05-30 MED ORDER — PROPOFOL 10 MG/ML IV BOLUS
INTRAVENOUS | Status: DC | PRN
  Administered 2022-05-30: 170 mg via INTRAVENOUS

## 2022-05-30 MED ORDER — PROMETHAZINE HCL 25 MG/ML IJ SOLN: 6.2500 mg | INTRAMUSCULAR | Status: DC | PRN

## 2022-05-30 MED ORDER — CHLORHEXIDINE GLUCONATE 0.12 % MT SOLN
15.0000 mL | Freq: Once | OROMUCOSAL | Status: AC
  Administered 2022-05-30: 15 mL via OROMUCOSAL

## 2022-05-30 MED ORDER — CEFAZOLIN SODIUM-DEXTROSE 2-4 GM/100ML-% IV SOLN
2.0000 g | INTRAVENOUS | Status: AC
  Administered 2022-05-30: 2 g via INTRAVENOUS
  Filled 2022-05-30: qty 100

## 2022-05-30 MED ORDER — DEXAMETHASONE SODIUM PHOSPHATE 10 MG/ML IJ SOLN
INTRAMUSCULAR | Status: DC | PRN
  Administered 2022-05-30: 10 mg via INTRAVENOUS

## 2022-05-30 MED ORDER — LACTATED RINGERS IV SOLN: INTRAVENOUS | Status: DC

## 2022-05-30 MED ORDER — 0.9 % SODIUM CHLORIDE (POUR BTL) OPTIME
TOPICAL | Status: DC | PRN
  Administered 2022-05-30: 1000 mL

## 2022-05-30 MED ORDER — IOHEXOL 300 MG/ML  SOLN
INTRAMUSCULAR | Status: DC | PRN
  Administered 2022-05-30: 9 mL

## 2022-05-30 MED ORDER — LIDOCAINE 2% (20 MG/ML) 5 ML SYRINGE
INTRAMUSCULAR | Status: DC | PRN
  Administered 2022-05-30: 80 mg via INTRAVENOUS

## 2022-05-30 MED ORDER — SUCCINYLCHOLINE CHLORIDE 200 MG/10ML IV SOSY
PREFILLED_SYRINGE | INTRAVENOUS | Status: DC | PRN
  Administered 2022-05-30: 100 mg via INTRAVENOUS

## 2022-05-30 MED ORDER — MIDAZOLAM HCL 2 MG/2ML IJ SOLN
INTRAMUSCULAR | Status: DC | PRN
  Administered 2022-05-30: 2 mg via INTRAVENOUS

## 2022-05-30 MED ORDER — OXYCODONE HCL 5 MG PO TABS: 5.0000 mg | ORAL_TABLET | Freq: Three times a day (TID) | ORAL | 0 refills | Status: DC | PRN

## 2022-05-30 MED ORDER — FENTANYL CITRATE (PF) 250 MCG/5ML IJ SOLN
INTRAMUSCULAR | Status: DC | PRN
  Administered 2022-05-30: 100 ug via INTRAVENOUS
  Administered 2022-05-30: 50 ug via INTRAVENOUS

## 2022-05-30 SURGICAL SUPPLY — 19 items
BAG URO CATCHER STRL LF (MISCELLANEOUS) ×2 IMPLANT
BASKET ZERO TIP NITINOL 2.4FR (BASKET) IMPLANT
BSKT STON RTRVL ZERO TP 2.4FR (BASKET) ×1
CATH URETL OPEN 5X70 (CATHETERS) ×2 IMPLANT
CLOTH BEACON ORANGE TIMEOUT ST (SAFETY) ×2 IMPLANT
DRSG TEGADERM 2-3/8X2-3/4 SM (GAUZE/BANDAGES/DRESSINGS) IMPLANT
EXTRACTOR STONE 1.7FRX115CM (UROLOGICAL SUPPLIES) IMPLANT
GLOVE BIO SURGEON STRL SZ 6.5 (GLOVE) ×2 IMPLANT
GOWN STRL REUS W/ TWL LRG LVL3 (GOWN DISPOSABLE) ×2 IMPLANT
GOWN STRL REUS W/TWL LRG LVL3 (GOWN DISPOSABLE) ×1
GUIDEWIRE STR DUAL SENSOR (WIRE) ×2 IMPLANT
KIT TURNOVER KIT A (KITS) IMPLANT
MANIFOLD NEPTUNE II (INSTRUMENTS) ×2 IMPLANT
PACK CYSTO (CUSTOM PROCEDURE TRAY) ×2 IMPLANT
SHEATH NAVIGATOR HD 11/13X28 (SHEATH) IMPLANT
SHEATH NAVIGATOR HD 11/13X36 (SHEATH) IMPLANT
TRACTIP FLEXIVA PULSE ID 200 (Laser) IMPLANT
TUBING CONNECTING 10 (TUBING) ×2 IMPLANT
TUBING UROLOGY SET (TUBING) ×2 IMPLANT

## 2022-05-30 NOTE — Interval H&P Note (Signed)
History and Physical Interval Note:  05/30/2022 3:37 PM  Heather Fox  has presented today for surgery, with the diagnosis of right ureteral stone.  The various methods of treatment have been discussed with the patient and family. After consideration of risks, benefits and other options for treatment, the patient has consented to  Procedure(s): CYSTOSCOPY WITH RETROGRADE PYELOGRAM, URETEROSCOPY AND STENT PLACEMENT (Right) as a surgical intervention.  The patient's history has been reviewed, patient examined, no change in status, stable for surgery.  I have reviewed the patient's chart and labs.  Questions were answered to the patient's satisfaction.     [place] Blizzard D Aariyah Sampey

## 2022-05-30 NOTE — Anesthesia Postprocedure Evaluation (Signed)
Anesthesia Post Note  Patient: Heather Fox  Procedure(s) Performed: CYSTOSCOPY WITH RETROGRADE PYELOGRAM, URETEROSCOPY, HOLMIUM LASER AND STENT PLACEMENT (Right)     Patient location during evaluation: PACU Anesthesia Type: General Level of consciousness: awake and alert Pain management: pain level controlled Vital Signs Assessment: post-procedure vital signs reviewed and stable Respiratory status: spontaneous breathing, nonlabored ventilation, respiratory function stable and patient connected to nasal cannula oxygen Cardiovascular status: blood pressure returned to baseline and stable Postop Assessment: no apparent nausea or vomiting Anesthetic complications: no   No notable events documented.  Last Vitals:  Vitals:   05/30/22 1700 05/30/22 1715  BP: (!) 122/91 115/89  Pulse: (!) 114 96  Resp: 20 18  Temp:  (!) 36.4 C  SpO2: 100% 96%    Last Pain:  Vitals:   05/30/22 1715  TempSrc:   PainSc: 0-No pain                 Santa Lighter

## 2022-05-30 NOTE — Discharge Instructions (Addendum)
DISCHARGE INSTRUCTIONS FOR KIDNEY STONE/URETERAL STENT   MEDICATIONS:  1. Resume all your other meds from home  2. AZO over the counter can help with the burning/stinging when you urinate. 3.  Patient was given oxycodone in the emergency room is for moderate/severe pain, otherwise taking up to 1000 mg every 6 hours of plain Tylenol will help treat your pain.   4. Tamsulosin can help with stent discomfort   ACTIVITY:  1. No strenuous activity x 1week  2. No driving while on narcotic pain medications  3. Drink plenty of water  4. Continue to walk at home - you can still get blood clots when you are at home, so keep active, but don't over do it.  5. May return to work/school tomorrow or when you feel ready   BATHING:  1. You can shower and we recommend daily showers    SIGNS/SYMPTOMS TO CALL:  Please call us if you have a fever greater than 101.5, uncontrolled nausea/vomiting, uncontrolled pain, dizziness, unable to urinate, bloody urine, chest pain, shortness of breath, leg swelling, leg pain, redness around wound, drainage from wound, or any other concerns or questions.   You can reach [place] at [phone].   FOLLOW-UP:  1. You will be contacted for your follow up appointment and stent removal.

## 2022-05-30 NOTE — H&P (Signed)
CC/HPI: cc: urolithiasis   05/30/22: 27 yo woman presented to [facility] ED with right flank pain found to have 2 mm distal ureteral calculus with mild right hydronephrosis. Pt has had persistent nausea and unable to keep anything done for the last 2 days. She received Dilaudid and fentanyl in the ED. She has an allergy to naproxen. Her pain is 10/10 in the office. This is her first stone episode.     ALLERGIES: Naproxen    MEDICATIONS: Tamsulosin Hcl 0.4 mg capsule  Celecoxib 100 mg capsule  Cyclobenzaprine Hcl 10 mg tablet  Emgality Pen 120 mg/ml pen injector  Metronidazole 500 mg tablet  Metronidazole 0.75 % gel  Norethindrone Acetate 5 mg tablet  Norethindrone-E.Estradiol-Iron  Nurtec Odt 75 mg tablet,disintegrating  Oxycodone-Acetaminophen 5 mg-325 mg tablet  Promethazine Hcl 25 mg tablet     GU PSH: No GU PSH    NON-GU PSH: No Non-GU PSH    GU PMH: None   NON-GU PMH: GERD    FAMILY HISTORY: Kidney Stones - Mother   SOCIAL HISTORY: Marital Status: Single Preferred Language: English; Ethnicity: Not Hispanic Or Latino; Race: Black or African American Current Smoking Status: Patient smokes.   Tobacco Use Assessment Completed: Used Tobacco in last 30 days? Has never drank.  Drinks 1 caffeinated drink per day.    REVIEW OF SYSTEMS:    GU Review Female:   Patient reports frequent urination and get up at night to urinate. Patient denies hard to postpone urination, burning /pain with urination, leakage of urine, stream starts and stops, trouble starting your stream, have to strain to urinate, and being pregnant.  Gastrointestinal (Upper):   Patient reports nausea, vomiting, and indigestion/ heartburn.   Gastrointestinal (Lower):   Patient reports constipation. Patient denies diarrhea.  Constitutional:   Patient reports fever, night sweats, and fatigue. Patient denies weight loss.  Skin:   Patient denies skin rash/ lesion and itching.  Eyes:   Patient denies blurred vision  and double vision.  Ears/ Nose/ Throat:   Patient denies sore throat and sinus problems.  Hematologic/Lymphatic:   Patient denies swollen glands and easy bruising.  Cardiovascular:   Patient denies leg swelling and chest pains.  Respiratory:   Patient reports shortness of breath. Patient denies cough.  Endocrine:   Patient reports excessive thirst.   Musculoskeletal:   Patient reports back pain and joint pain.   Neurological:   Patient reports dizziness. Patient denies headaches.  Psychologic:   Patient denies depression and anxiety.   VITAL SIGNS:      05/30/2022 02:25 PM  Weight 156 lb / 70.76 kg  Height 64 in / 162.56 cm  BP 118/84 mmHg  Pulse 106 /min  Temperature 98.4 F / 36.8 C  BMI 26.8 kg/m   MULTI-SYSTEM PHYSICAL EXAMINATION:    Constitutional: Well-nourished. No physical deformities. Normally developed. Good grooming. actively vomiting.  Neck: Neck symmetrical, not swollen. Normal tracheal position.  Respiratory: No labored breathing, no use of accessory muscles.   Skin: No paleness, no jaundice, no cyanosis. No lesion, no ulcer, no rash.  Neurologic / Psychiatric: Oriented to time, oriented to place, oriented to person. No depression, no anxiety, no agitation.  Eyes: Normal conjunctivae. Normal eyelids.  Ears, Nose, Mouth, and Throat: Left ear no scars, no lesions, no masses. Right ear no scars, no lesions, no masses. Nose no scars, no lesions, no masses. Normal hearing. Normal lips.  Musculoskeletal: Normal gait and station of head and neck.     Complexity of  Data:  Records Review:   Previous Patient Records, POC Tool  Urine Test Review:   Urinalysis  X-Ray Review: C.T. Abdomen/Pelvis: Reviewed Films. Reviewed Report. Discussed With Patient. IMPRESSION:  1. There is mild right hydronephrosis and perinephric stranding.  There is decreased attenuation in the right kidney relative to the  left. There is right ureterectasis. There is a calcification in the  right side of  the pelvis which could represent a distal ureteral  stone measuring 2.1 mm versus a phlebolith. The constellation of  findings could represent a small distal ureteral stone or a recently  passed stone. The decreased attenuation in the right kidney could be  due to obstruction. Early pyelonephritis not excluded although there  is no definitive striated nephrogram at this time. Recommend  clinical correlation and correlation with urinalysis.  2. There are 2 tiny stones in the right kidney with the largest  measuring 2 mm.  3. There is a single stone in the left kidney measuring 3 or 4 mm.  4. No other abnormalities.    Electronically Signed  By: Gerome Sam III M.D.  On: 05/27/2022 09:19    PROCEDURES:          Morphine 4mg  - [id number], J2270 Given 4mg  and wasted 0mg    Qty: 4 Adm. By:  Unit: mg Lot No  Route: IM Exp. Date 01/21/2023  Freq: None Mfgr.:   Site: Right Buttock         Phenergan 25mg  - Roanna Banning, [id number] Given 25mg  and wasted 0mg    Qty: 25 Adm. By: [id number]  Unit: mg Lot No 03/23/2023  Route: IM Exp. Date 06/23/2023  Freq: None Mfgr.:   Site: Left Buttock   ASSESSMENT:      ICD-10 Details  1 GU:   Ureteral calculus - N20.1 Acute, Uncomplicated   PLAN:           Document Letter(s):  Created for Patient: Clinical Summary         Notes:   1. Ureteral calculus:  -Patient with 2 mm distal right ureteral calculus and inability to tolerate p.o. for the past 3 days  -We discussed continuation of medical expulsive therapy versus surgical intervention with ureteroscopy  -Risks and benefits of cystoscopy with right ureteroscopy, laser lithotripsy, stent placement and retrograde pyelogram discussed with the patient including but not limited to bleeding, infection, damage surrounding structures, stent discomfort, inability to remove stone, need for additional procedures, pain.

## 2022-05-30 NOTE — Transfer of Care (Signed)
Immediate Anesthesia Transfer of Care Note  Patient: Heather Fox  Procedure(s) Performed: CYSTOSCOPY WITH RETROGRADE PYELOGRAM, URETEROSCOPY, HOLMIUM LASER AND STENT PLACEMENT (Right)  Patient Location: PACU  Anesthesia Type:General  Level of Consciousness: awake and drowsy  Airway & Oxygen Therapy: Patient Spontanous Breathing and Patient connected to face mask oxygen  Post-op Assessment: Report given to RN and Post -op Vital signs reviewed and stable  Post vital signs: Reviewed and stable  Last Vitals:  Vitals Value Taken Time  BP 114/68 05/30/22 1649  Temp    Pulse 123 05/30/22 1652  Resp 26 05/30/22 1652  SpO2 95 % 05/30/22 1652  Vitals shown include unvalidated device data.  Last Pain:  Vitals:   05/30/22 1500  TempSrc: Oral  PainSc: 10-Worst pain ever      Patients Stated Pain Goal: 4 (56/38/75 6433)  Complications: No notable events documented.

## 2022-05-30 NOTE — Anesthesia Preprocedure Evaluation (Addendum)
Anesthesia Evaluation  Patient identified by MRN, date of birth, ID band Patient awake    Reviewed: Allergy & Precautions, NPO status , Patient's Chart, lab work & pertinent test results  Airway Mallampati: II  TM Distance: >3 FB Neck ROM: Full    Dental  (+) Teeth Intact, Dental Advisory Given   Pulmonary Current Smoker   Pulmonary exam normal breath sounds clear to auscultation       Cardiovascular negative cardio ROS Normal cardiovascular exam Rhythm:Regular Rate:Normal     Neuro/Psych  Headaches    GI/Hepatic Neg liver ROS,GERD  ,,  Endo/Other  negative endocrine ROS    Renal/GU Renal disease (AKI)right ureteral stone      Musculoskeletal negative musculoskeletal ROS (+)    Abdominal   Peds  Hematology  (+) Blood dyscrasia, anemia   Anesthesia Other Findings Day of surgery medications reviewed with the patient.  Reproductive/Obstetrics negative OB ROS                             Anesthesia Physical Anesthesia Plan  ASA: 2 and emergent  Anesthesia Plan: General   Post-op Pain Management: Ofirmev IV (intra-op)*   Induction: Intravenous, Rapid sequence and Cricoid pressure planned  PONV Risk Score and Plan: 3 and Midazolam, Dexamethasone and Ondansetron  Airway Management Planned: Oral ETT  Additional Equipment:   Intra-op Plan:   Post-operative Plan: Extubation in OR  Informed Consent: I have reviewed the patients History and Physical, chart, labs and discussed the procedure including the risks, benefits and alternatives for the proposed anesthesia with the patient or authorized representative who has indicated his/her understanding and acceptance.     Dental advisory given  Plan Discussed with: CRNA  Anesthesia Plan Comments:        Anesthesia Quick Evaluation

## 2022-05-30 NOTE — Anesthesia Procedure Notes (Signed)
Procedure Name: Intubation Date/Time: 05/30/2022 4:11 PM  Performed by: Lollie Sails, CRNAPre-anesthesia Checklist: Patient identified, Emergency Drugs available, Suction available, Patient being monitored and Timeout performed Patient Re-evaluated:Patient Re-evaluated prior to induction Oxygen Delivery Method: Circle system utilized Preoxygenation: Pre-oxygenation with 100% oxygen Induction Type: IV induction, Rapid sequence and Cricoid Pressure applied Laryngoscope Size: Miller and 3 Grade View: Grade I Tube type: Oral Tube size: 7.5 mm Number of attempts: 1 Airway Equipment and Method: Stylet Placement Confirmation: ETT inserted through vocal cords under direct vision, positive ETCO2 and breath sounds checked- equal and bilateral Secured at: 23 cm Tube secured with: Tape Dental Injury: Teeth and Oropharynx as per pre-operative assessment

## 2022-05-30 NOTE — Op Note (Signed)
Preoperative diagnosis: right ureteral calculus  Postoperative diagnosis: right ureteral calculus  Procedure:  Cystoscopy right ureteroscopy, laser lithotripsy  right 71F x 26cm ureteral stent placement no tether right retrograde pyelography with interpretation  Surgeon: Jacalyn Lefevre, MD  Anesthesia: General  Complications: None  Intraoperative findings:  Normal urethra Bilateral orthotropic ureteral orifices right retrograde pyelography demonstrated a filling defect within the right ureter consistent with the patient's known calculus without other abnormalities as well as mild hydronephrosis.  Of note fluoroscopy does show a distal calcification however I think this is outside the ureter. Bladder mucosa normal without masses  Very narrow right distal ureter  EBL: Minimal  Specimens: right ureteral calculus  Disposition of specimens: [facility] for stone analysis  Indication: Heather Fox is a 27 y.o.   patient with a 58mm distal right ureteral stone and associated right symptoms as well as inability tolerate p.o. for 3 days. After reviewing the management options for treatment, the patient elected to proceed with the above surgical procedure(s). We have discussed the potential benefits and risks of the procedure, side effects of the proposed treatment, the likelihood of the patient achieving the goals of the procedure, and any potential problems that might occur during the procedure or recuperation. Informed consent has been obtained.   Description of procedure:  The patient was taken to the operating room and general anesthesia was induced.  The patient was placed in the dorsal lithotomy position, prepped and draped in the usual sterile fashion, and preoperative antibiotics were administered. A preoperative time-out was performed.   Cystourethroscopy was performed.  The patient's urethra was examined and was normal. The bladder was then systematically  examined in its entirety. There was no evidence for any bladder tumors, stones, or other mucosal pathology.    Attention then turned to the right ureteral orifice and a ureteral catheter was used to intubate the ureteral orifice.  Omnipaque contrast was injected through the ureteral catheter and a retrograde pyelogram was performed with findings as dictated above.  A 0.38 sensor guidewire was then advanced up the right ureter into the renal pelvis under fluoroscopic guidance. The 4.5 Fr semirigid ureteroscope was then advanced into the ureter next to the guidewire and the calculus was identified.  The distal ureter was very narrow and the ureteroscope was navigated over a second sensor wire which was then removed with a stone was seen. The stone was then fragmented with the 242 m holmium laser fiber.  The stone fragments then migrated proximally and I was unable to get to them with the ureteroscope.  Given how tight the ureter was no further attempts with different ureteroscope were made.  The ureteroscope was removed and no trauma or injury to the ureter was noted.  The wire was then backloaded through the cystoscope and a ureteral stent was advance over the wire using Seldinger technique.  The stent was positioned appropriately under fluoroscopic and cystoscopic guidance.  The wire was then removed with an adequate stent curl noted in the renal pelvis as well as in the bladder.  The bladder was then emptied and the procedure ended.  The patient appeared to tolerate the procedure well and without complications.  The patient was able to be awakened and transferred to the recovery unit in satisfactory condition.   Disposition: The tether of the stent was removed.  Heather Fox will have follow-up scheduled in 1 week in the office and consider stent removal at that time.

## 2022-05-31 ENCOUNTER — Encounter (HOSPITAL_COMMUNITY): Payer: Self-pay | Admitting: Urology

## 2022-06-07 ENCOUNTER — Other Ambulatory Visit: Payer: Self-pay | Admitting: Urology

## 2022-06-13 NOTE — Patient Instructions (Addendum)
SURGICAL WAITING ROOM VISITATION  Patients having surgery or a procedure may have no more than 2 support people in the waiting area - these visitors may rotate.    Children under the age of 29 must have an adult with them who is not the patient.  Due to an increase in RSV and influenza rates and associated hospitalizations, children ages 5 and under may not visit patients in [place].  If the patient needs to stay at the hospital during part of their recovery, the visitor guidelines for inpatient rooms apply. Pre-op nurse will coordinate an appropriate time for 1 support person to accompany patient in pre-op.  This support person may not rotate.    Please refer to the [facility] website for the visitor guidelines for Inpatients (after your surgery is over and you are in a regular room).       Your procedure is scheduled on: 06-20-22   Report to [facility] Main Entrance    Report to admitting at      1215 PM   Call this number if you have problems the morning of surgery [phone]   Do not eat food or drink liquids :After Midnight.           If you have questions, please contact your surgeon's office.   FOLLOW  AND ANY ADDITIONAL PRE OP INSTRUCTIONS YOU RECEIVED FROM YOUR SURGEON'S OFFICE!!!     Oral Hygiene is also important to reduce your risk of infection.                                    Remember - BRUSH YOUR TEETH THE MORNING OF SURGERY WITH YOUR REGULAR TOOTHPASTE  DENTURES WILL BE REMOVED PRIOR TO SURGERY PLEASE DO NOT APPLY "Poly grip" OR ADHESIVES!!!   Do NOT smoke after Midnight   Take these medicines the morning of surgery with A SIP OF WATER:oxycodone if needed                                You may not have any metal on your body including hair pins, jewelry, and body piercing             Do not wear make-up, lotions, powders, perfumes/cologne, or deodorant  Do not wear nail polish including gel and S&S, artificial/acrylic nails,  or any other type of covering on natural nails including finger and toenails. If you have artificial nails, gel coating, etc. that needs to be removed by a nail salon please have this removed prior to surgery or surgery may need to be canceled/ delayed if the surgeon/ anesthesia feels like they are unable to be safely monitored.   Do not shave  48 hours prior to surgery.                Do not bring valuables to the hospital. [place].   Contacts, glasses, dentures or bridgework may not be worn into surgery.   Bring small overnight bag day of surgery.   DO NOT [place]. PHARMACY WILL DISPENSE MEDICATIONS LISTED ON YOUR MEDICATION LIST TO YOU DURING YOUR ADMISSION [place]!    Patients discharged on the day of surgery will not be allowed  to drive home.  Someone NEEDS to stay with you for the first 24 hours after anesthesia.                Please read over the following fact sheets you were given: IF [place] [phone]   If you received a COVID test during your pre-op visit  it is requested that you wear a mask when out in public, stay away from anyone that may not be feeling well and notify your surgeon if you develop symptoms. If you test positive for Covid or have been in contact with anyone that has tested positive in the last 10 days please notify you surgeon.    [place] - Preparing for Surgery Before surgery, you can play an important role.  Because skin is not sterile, your skin needs to be as free of germs as possible.  You can reduce the number of germs on your skin by washing with CHG (chlorahexidine gluconate) soap before surgery.  CHG is an antiseptic cleaner which kills germs and bonds with the skin to continue killing germs even after washing. Please DO NOT use if you have an allergy to CHG or antibacterial soaps.  If your skin  becomes reddened/irritated stop using the CHG and inform your nurse when you arrive at Short Stay. Do not shave (including legs and underarms) for at least 48 hours prior to the first CHG shower.  You may shave your face/neck. Please follow these instructions carefully:  1.  Shower with CHG Soap the night before surgery and the  morning of Surgery.  2.  If you choose to wash your hair, wash your hair first as usual with your  normal  shampoo.  3.  After you shampoo, rinse your hair and body thoroughly to remove the  shampoo.                           4.  Use CHG as you would any other liquid soap.  You can apply chg directly  to the skin and wash                       Gently with a scrungie or clean washcloth.  5.  Apply the CHG Soap to your body ONLY FROM THE NECK DOWN.   Do not use on face/ open                           Wound or open sores. Avoid contact with eyes, ears mouth and genitals (private parts).                       Wash face,  Genitals (private parts) with your normal soap.             6.  Wash thoroughly, paying special attention to the area where your surgery  will be performed.  7.  Thoroughly rinse your body with warm water from the neck down.  8.  DO NOT shower/wash with your normal soap after using and rinsing off  the CHG Soap.                9.  Pat yourself dry with a clean towel.            10.  Wear clean pajamas.  11.  Place clean sheets on your bed the night of your first shower and do not  sleep with pets. Day of Surgery : Do not apply any lotions/deodorants the morning of surgery.  Please wear clean clothes to the hospital/surgery center.  FAILURE TO FOLLOW THESE INSTRUCTIONS MAY RESULT IN THE CANCELLATION OF YOUR SURGERY PATIENT SIGNATURE_________________________________  NURSE SIGNATURE__________________________________  ________________________________________________________________________

## 2022-06-13 NOTE — Progress Notes (Addendum)
PCP -  Cardiologist -   PPM/ICD -  Device Orders -  Rep Notified -   Chest x-ray -  EKG - 05-30-22 epic Stress Test -  ECHO -  Cardiac Cath -  Cbc/diff,CMP 05-27-22 epic  Sleep Study -  CPAP -   Fasting Blood Sugar -  Checks Blood Sugar _____ times a day  Blood Thinner Instructions: Aspirin Instructions:  ERAS Protcol - PRE-SURGERY Ensure or G2-   COVID TEST-  COVID vaccine -  Activity-- Anesthesia review:   Patient denies shortness of breath, fever, cough and chest pain at PAT appointment   All instructions explained to the patient, with a verbal understanding of the material. Patient agrees to go over the instructions while at home for a better understanding. Patient also instructed to self quarantine after being tested for COVID-19. The opportunity to ask questions was provided.

## 2022-06-16 ENCOUNTER — Encounter (HOSPITAL_COMMUNITY): Payer: Self-pay

## 2022-06-16 ENCOUNTER — Other Ambulatory Visit: Payer: Self-pay

## 2022-06-16 ENCOUNTER — Encounter (HOSPITAL_COMMUNITY)
Admission: RE | Admit: 2022-06-16 | Discharge: 2022-06-16 | Disposition: A | Discharge status: Home / Self Care | Payer: Medicaid Other | Source: Ambulatory Visit | Attending: Urology | Admitting: Urology

## 2022-06-16 VITALS — BP 110/74 | HR 97 | Temp 99.1°F | Ht 65.0 in | Wt 153.0 lb

## 2022-06-16 DIAGNOSIS — Z01818 Encounter for other preprocedural examination: Secondary | ICD-10-CM | POA: Insufficient documentation

## 2022-06-16 HISTORY — DX: Personal history of urinary calculi: Z87.442

## 2022-06-16 HISTORY — DX: Anemia, unspecified: D64.9

## 2022-06-20 ENCOUNTER — Ambulatory Visit (HOSPITAL_COMMUNITY): Payer: Medicaid Other | Admitting: Anesthesiology

## 2022-06-20 ENCOUNTER — Encounter (HOSPITAL_COMMUNITY)
Admission: RE | Disposition: A | Discharge status: Home / Self Care | Payer: Self-pay | Source: Ambulatory Visit | Attending: Urology

## 2022-06-20 ENCOUNTER — Other Ambulatory Visit: Payer: Self-pay

## 2022-06-20 ENCOUNTER — Ambulatory Visit (HOSPITAL_BASED_OUTPATIENT_CLINIC_OR_DEPARTMENT_OTHER): Payer: Medicaid Other | Admitting: Anesthesiology

## 2022-06-20 ENCOUNTER — Ambulatory Visit (HOSPITAL_COMMUNITY)
Admission: RE | Admit: 2022-06-20 | Discharge: 2022-06-20 | Disposition: A | Discharge status: Home / Self Care | Payer: Medicaid Other | Source: Ambulatory Visit | Attending: Urology | Admitting: Urology

## 2022-06-20 ENCOUNTER — Encounter (HOSPITAL_COMMUNITY): Payer: Self-pay | Admitting: Urology

## 2022-06-20 ENCOUNTER — Ambulatory Visit (HOSPITAL_COMMUNITY): Payer: Medicaid Other

## 2022-06-20 DIAGNOSIS — N201 Calculus of ureter: Secondary | ICD-10-CM

## 2022-06-20 DIAGNOSIS — Z01818 Encounter for other preprocedural examination: Secondary | ICD-10-CM

## 2022-06-20 DIAGNOSIS — R519 Headache, unspecified: Secondary | ICD-10-CM | POA: Insufficient documentation

## 2022-06-20 DIAGNOSIS — F17200 Nicotine dependence, unspecified, uncomplicated: Secondary | ICD-10-CM | POA: Insufficient documentation

## 2022-06-20 DIAGNOSIS — N132 Hydronephrosis with renal and ureteral calculous obstruction: Secondary | ICD-10-CM | POA: Diagnosis present

## 2022-06-20 HISTORY — PX: CYSTOSCOPY WITH RETROGRADE PYELOGRAM, URETEROSCOPY AND STENT PLACEMENT: SHX5789

## 2022-06-20 LAB — POCT PREGNANCY, URINE: Preg Test, Ur: NEGATIVE

## 2022-06-20 SURGERY — CYSTOURETEROSCOPY, WITH RETROGRADE PYELOGRAM AND STENT INSERTION
Anesthesia: General | Laterality: Right

## 2022-06-20 MED ORDER — IOHEXOL 300 MG/ML  SOLN
INTRAMUSCULAR | Status: DC | PRN
  Administered 2022-06-20: 10 mL

## 2022-06-20 MED ORDER — CHLORHEXIDINE GLUCONATE 0.12 % MT SOLN
15.0000 mL | Freq: Once | OROMUCOSAL | Status: AC
  Administered 2022-06-20: 15 mL via OROMUCOSAL

## 2022-06-20 MED ORDER — PROPOFOL 10 MG/ML IV BOLUS
INTRAVENOUS | Status: DC | PRN
  Administered 2022-06-20: 200 mg via INTRAVENOUS
  Administered 2022-06-20: 50 mg via INTRAVENOUS

## 2022-06-20 MED ORDER — LIDOCAINE 2% (20 MG/ML) 5 ML SYRINGE
INTRAMUSCULAR | Status: DC | PRN
  Administered 2022-06-20: 40 mg via INTRAVENOUS
  Administered 2022-06-20: 60 mg via INTRAVENOUS

## 2022-06-20 MED ORDER — KETOROLAC TROMETHAMINE 30 MG/ML IJ SOLN
INTRAMUSCULAR | Status: DC | PRN
  Administered 2022-06-20: 30 mg via INTRAVENOUS

## 2022-06-20 MED ORDER — CIPROFLOXACIN HCL 500 MG PO TABS: 500.0000 mg | ORAL_TABLET | Freq: Once | ORAL | 0 refills | Status: AC

## 2022-06-20 MED ORDER — LIDOCAINE HCL (PF) 2 % IJ SOLN
INTRAMUSCULAR | Status: AC
  Filled 2022-06-20: qty 5

## 2022-06-20 MED ORDER — FENTANYL CITRATE (PF) 100 MCG/2ML IJ SOLN
INTRAMUSCULAR | Status: AC
  Filled 2022-06-20: qty 2

## 2022-06-20 MED ORDER — OXYCODONE HCL 5 MG PO TABS: 5.0000 mg | ORAL_TABLET | Freq: Once | ORAL | Status: DC | PRN

## 2022-06-20 MED ORDER — MIDAZOLAM HCL 5 MG/5ML IJ SOLN
INTRAMUSCULAR | Status: DC | PRN
  Administered 2022-06-20: 2 mg via INTRAVENOUS

## 2022-06-20 MED ORDER — DEXAMETHASONE SODIUM PHOSPHATE 10 MG/ML IJ SOLN
INTRAMUSCULAR | Status: DC | PRN
  Administered 2022-06-20: 10 mg via INTRAVENOUS

## 2022-06-20 MED ORDER — AMISULPRIDE (ANTIEMETIC) 5 MG/2ML IV SOLN: 10.0000 mg | Freq: Once | INTRAVENOUS | Status: DC | PRN

## 2022-06-20 MED ORDER — OXYCODONE HCL 5 MG/5ML PO SOLN: 5.0000 mg | Freq: Once | ORAL | Status: DC | PRN

## 2022-06-20 MED ORDER — SODIUM CHLORIDE 0.9 % IR SOLN
Status: DC | PRN
  Administered 2022-06-20: 3000 mL via INTRAVESICAL

## 2022-06-20 MED ORDER — FENTANYL CITRATE PF 50 MCG/ML IJ SOSY: 25.0000 ug | PREFILLED_SYRINGE | INTRAMUSCULAR | Status: DC | PRN

## 2022-06-20 MED ORDER — CEFAZOLIN SODIUM-DEXTROSE 2-4 GM/100ML-% IV SOLN
2.0000 g | INTRAVENOUS | Status: AC
  Administered 2022-06-20: 2 g via INTRAVENOUS

## 2022-06-20 MED ORDER — FENTANYL CITRATE (PF) 100 MCG/2ML IJ SOLN
INTRAMUSCULAR | Status: DC | PRN
  Administered 2022-06-20 (×2): 50 ug via INTRAVENOUS

## 2022-06-20 MED ORDER — ONDANSETRON HCL 4 MG/2ML IJ SOLN
INTRAMUSCULAR | Status: AC
  Filled 2022-06-20: qty 2

## 2022-06-20 MED ORDER — PROPOFOL 10 MG/ML IV BOLUS
INTRAVENOUS | Status: AC
  Filled 2022-06-20: qty 20

## 2022-06-20 MED ORDER — ONDANSETRON HCL 4 MG/2ML IJ SOLN
INTRAMUSCULAR | Status: DC | PRN
  Administered 2022-06-20: 4 mg via INTRAVENOUS

## 2022-06-20 MED ORDER — DEXAMETHASONE SODIUM PHOSPHATE 10 MG/ML IJ SOLN
INTRAMUSCULAR | Status: AC
  Filled 2022-06-20: qty 1

## 2022-06-20 MED ORDER — STERILE WATER FOR IRRIGATION IR SOLN
Status: DC | PRN
  Administered 2022-06-20: 1000 mL

## 2022-06-20 MED ORDER — ORAL CARE MOUTH RINSE: 15.0000 mL | Freq: Once | OROMUCOSAL | Status: AC

## 2022-06-20 MED ORDER — PROMETHAZINE HCL 25 MG/ML IJ SOLN: 6.2500 mg | INTRAMUSCULAR | Status: DC | PRN

## 2022-06-20 MED ORDER — CEFAZOLIN SODIUM-DEXTROSE 2-4 GM/100ML-% IV SOLN
INTRAVENOUS | Status: AC
  Filled 2022-06-20: qty 100

## 2022-06-20 MED ORDER — ACETAMINOPHEN 10 MG/ML IV SOLN: 1000.0000 mg | Freq: Once | INTRAVENOUS | Status: DC | PRN

## 2022-06-20 MED ORDER — LACTATED RINGERS IV SOLN: INTRAVENOUS | Status: DC

## 2022-06-20 MED ORDER — MIDAZOLAM HCL 2 MG/2ML IJ SOLN
INTRAMUSCULAR | Status: AC
  Filled 2022-06-20: qty 2

## 2022-06-20 SURGICAL SUPPLY — 24 items
BAG URO CATCHER STRL LF (MISCELLANEOUS) ×2 IMPLANT
BASKET ZERO TIP NITINOL 2.4FR (BASKET) IMPLANT
BSKT STON RTRVL ZERO TP 2.4FR (BASKET) ×1
CATH URETL OPEN 5X70 (CATHETERS) ×2 IMPLANT
CLOTH BEACON ORANGE TIMEOUT ST (SAFETY) ×2 IMPLANT
DRSG TEGADERM 2-3/8X2-3/4 SM (GAUZE/BANDAGES/DRESSINGS) IMPLANT
EXTRACTOR STONE 1.7FRX115CM (UROLOGICAL SUPPLIES) IMPLANT
FIBER LASER MOSES 200 DFL (Laser) IMPLANT
FIBER LASER MOSES 365 DFL (Laser) IMPLANT
GLOVE BIO SURGEON STRL SZ 6.5 (GLOVE) ×2 IMPLANT
GOWN STRL REUS W/ TWL LRG LVL3 (GOWN DISPOSABLE) ×2 IMPLANT
GOWN STRL REUS W/TWL LRG LVL3 (GOWN DISPOSABLE) ×1
GUIDEWIRE STR DUAL SENSOR (WIRE) ×2 IMPLANT
KIT TURNOVER KIT A (KITS) IMPLANT
LASER FIB FLEXIVA PULSE ID 365 (Laser) IMPLANT
MANIFOLD NEPTUNE II (INSTRUMENTS) ×2 IMPLANT
PACK CYSTO (CUSTOM PROCEDURE TRAY) ×2 IMPLANT
SHEATH NAVIGATOR HD 11/13X28 (SHEATH) IMPLANT
SHEATH NAVIGATOR HD 11/13X36 (SHEATH) IMPLANT
STENT URET 6FRX24 CONTOUR (STENTS) IMPLANT
TRACTIP FLEXIVA PULS ID 200XHI (Laser) IMPLANT
TRACTIP FLEXIVA PULSE ID 200 (Laser)
TUBING CONNECTING 10 (TUBING) ×2 IMPLANT
TUBING UROLOGY SET (TUBING) ×2 IMPLANT

## 2022-06-20 NOTE — Anesthesia Preprocedure Evaluation (Addendum)
Anesthesia Evaluation  Patient identified by MRN, date of birth, ID band Patient awake    Reviewed: Allergy & Precautions, NPO status , Patient's Chart, lab work & pertinent test results  Airway Mallampati: II  TM Distance: >3 FB Neck ROM: Full    Dental no notable dental hx.    Pulmonary Current Smoker and Patient abstained from smoking.   Pulmonary exam normal        Cardiovascular negative cardio ROS Normal cardiovascular exam     Neuro/Psych  Headaches  negative psych ROS   GI/Hepatic Neg liver ROS,GERD  ,,  Endo/Other  negative endocrine ROS    Renal/GU negative Renal ROS     Musculoskeletal negative musculoskeletal ROS (+)    Abdominal   Peds  Hematology negative hematology ROS (+)   Anesthesia Other Findings RIGHT URETERAL CALCULUS  Reproductive/Obstetrics Hcg negative                             Anesthesia Physical Anesthesia Plan  ASA: 2  Anesthesia Plan: General   Post-op Pain Management:    Induction: Intravenous  PONV Risk Score and Plan: 2 and Ondansetron, Dexamethasone, Midazolam and Treatment may vary due to age or medical condition  Airway Management Planned: LMA  Additional Equipment:   Intra-op Plan:   Post-operative Plan: Extubation in OR  Informed Consent: I have reviewed the patients History and Physical, chart, labs and discussed the procedure including the risks, benefits and alternatives for the proposed anesthesia with the patient or authorized representative who has indicated his/her understanding and acceptance.     Dental advisory given  Plan Discussed with: CRNA  Anesthesia Plan Comments:        Anesthesia Quick Evaluation

## 2022-06-20 NOTE — Op Note (Signed)
Preoperative diagnosis: right ureteral calculus  Postoperative diagnosis: right ureteral calculus  Procedure:  Cystoscopy right ureteroscopy with basket stone extraction right 75F x 24 ureteral stent placement - with tether   Surgeon: Jacalyn Lefevre, MD  Anesthesia: General  Complications: None  Intraoperative findings:  Normal urethra Bilateral orthotropic ureteral orifices Bladder mucosa normal without masses   EBL: Minimal  Specimens: right ureteral calculus  Disposition of specimens: [facility] for stone analysis  Indication: Heather Fox is a 27 y.o.   patient with a 55mm right ureteral stone and associated right symptoms who underwent previous ureteroscopy with lithotripsy and stent placement.  I was unable to basket the fragments of that time so she now returns for second look. After reviewing the management options for treatment, the patient elected to proceed with the above surgical procedure(s). We have discussed the potential benefits and risks of the procedure, side effects of the proposed treatment, the likelihood of the patient achieving the goals of the procedure, and any potential problems that might occur during the procedure or recuperation. Informed consent has been obtained.   Description of procedure:  The patient was taken to the operating room and general anesthesia was induced.  The patient was placed in the dorsal lithotomy position, prepped and draped in the usual sterile fashion, and preoperative antibiotics were administered. A preoperative time-out was performed.   Cystourethroscopy was performed.  The patient's urethra was examined and was normal.  There was no evidence for any bladder tumors, stones, or other mucosal pathology.    Attention then turned to the right ureteral orifice and the existing ureteral stent was grasped with a grasper and brought to the urethral meatus.  A 0.38 sensor wire was then advanced through the  stent up to the kidney with fluoroscopic guidance.  The stent was removed and examined.  It was deemed to be intact and then discarded.  Next a semirigid ureteroscope was advanced alongside the wire to the proximal ureter.  No stone fragments or trauma to the ureter were encountered.  A second wire was placed through the ureteroscope and the ureteroscope was removed.  A ureteral access sheath was placed over one of the wires and advanced to the proximal ureter with fluoroscopic guidance.  The inner sheath and wire were removed.  Flexible ureteroscopy then took place.  There were 3 submillimeter fragments that were removed with a 0 tip basket.  No other stones were seen.  The ureteroscope was removed and unison with the access sheath taking care to examine the ureter on the way out.  No residual fragments or trauma to the ureter was noted.    The wire was then backloaded through the cystoscope and a ureteral stent was advance over the wire using Seldinger technique.  The stent was positioned appropriately under fluoroscopic and cystoscopic guidance.  The wire was then removed with an adequate stent curl noted in the renal pelvis as well as in the bladder.  The bladder was then emptied and the procedure ended.  The patient appeared to tolerate the procedure well and without complications.  The patient was able to be awakened and transferred to the recovery unit in satisfactory condition.   Disposition: The tether of the stent was left on and tucked inside the patient's vagina.  Instructions for removing the stent have been provided to the patient.

## 2022-06-20 NOTE — Transfer of Care (Signed)
Immediate Anesthesia Transfer of Care Note  Patient: Heather Fox  Procedure(s) Performed: CYSTOSCOPY WITH RETROGRADE PYELOGRAM, URETEROSCOPY AND STENT PLACEMENT (Right)  Patient Location: PACU  Anesthesia Type:General  Level of Consciousness: drowsy  Airway & Oxygen Therapy: Patient Spontanous Breathing and Patient connected to face mask oxygen  Post-op Assessment: Report given to RN and Post -op Vital signs reviewed and stable  Post vital signs: Reviewed and stable  Last Vitals:  Vitals Value Taken Time  BP 129/96 06/20/22 1516  Temp    Pulse 79 06/20/22 1517  Resp 22 06/20/22 1517  SpO2 100 % 06/20/22 1517  Vitals shown include unvalidated device data.  Last Pain:  Vitals:   06/20/22 1322  TempSrc:   PainSc: 0-No pain         Complications: No notable events documented.

## 2022-06-20 NOTE — Interval H&P Note (Signed)
History and Physical Interval Note:  06/20/2022 1:55 PM  Heather Fox  has presented today for surgery, with the diagnosis of RIGHT URETERAL CALCULUS.  The various methods of treatment have been discussed with the patient and family. After consideration of risks, benefits and other options for treatment, the patient has consented to  Procedure(s) with comments: CYSTOSCOPY WITH RETROGRADE PYELOGRAM, URETEROSCOPY AND STENT PLACEMENT (Right) - 1 HR HOLMIUM LASER APPLICATION (Right) as a surgical intervention.  The patient's history has been reviewed, patient examined, no change in status, stable for surgery.  I have reviewed the patient's chart and labs.  Questions were answered to the patient's satisfaction.     Mikel Pyon D Husein Guedes

## 2022-06-20 NOTE — Anesthesia Procedure Notes (Signed)
Procedure Name: LMA Insertion Date/Time: 06/20/2022 2:39 PM  Performed by: Sharlette Dense, CRNAPatient Re-evaluated:Patient Re-evaluated prior to induction Oxygen Delivery Method: Circle system utilized Preoxygenation: Pre-oxygenation with 100% oxygen Induction Type: IV induction LMA: LMA inserted LMA Size: 4.0 Number of attempts: 1 Placement Confirmation: positive ETCO2 and breath sounds checked- equal and bilateral Tube secured with: Tape

## 2022-06-20 NOTE — H&P (Signed)
CC/HPI: cc: urolithiasis   05/30/22: 27 yo woman presented to [facility] ED with right flank pain found to have 2 mm distal ureteral calculus with mild right hydronephrosis. Pt has had persistent nausea and unable to keep anything done for the last 2 days. She received Dilaudid and fentanyl in the ED. She has an allergy to naproxen. Her pain is 10/10 in the office. This is her first stone episode.   06/02/2022: 27 year old woman with a 2 mm distal right ureteral calculus underwent ureteroscopy with lithotripsy and stent placement on 05/30/2022. She returns with right flank pain and gross hematuria with clots. She was unsure if this is normal after her surgery. She is actually feeling a little bit better today. No fevers or chills. She is constipated.   06/06/2022: 27 year old female who underwent a right ureteroscopy due to 2 mm distal right ureteral stone. She was found to have a very tight right ureter. Dr. Claudia Desanctis had mentioned possibly going in and taking a second look and the patient would like to proceed with this prior to her stent removal. She is otherwise tolerating the stent but does report that it is uncomfortable.     ALLERGIES: Naproxen    MEDICATIONS: Tamsulosin Hcl 0.4 mg capsule  Celecoxib 100 mg capsule  Cyclobenzaprine Hcl 10 mg tablet  Emgality Pen 120 mg/ml pen injector  Metronidazole 500 mg tablet  Metronidazole 0.75 % gel  Norethindrone Acetate 5 mg tablet  Norethindrone-E.Estradiol-Iron  Nurtec Odt 75 mg tablet,disintegrating  Oxycodone Hcl 5 mg tablet 1 tablet PO Q 6 H PRN severe flank pain  Oxycodone-Acetaminophen 5 mg-325 mg tablet  Promethazine Hcl 25 mg tablet     GU PSH: Ureteroscopic laser litho, Right - 05/30/2022     NON-GU PSH: No Non-GU PSH    GU PMH: Flank Pain - 06/02/2022 Ureteral calculus - 06/02/2022, - 05/30/2022    NON-GU PMH: GERD    FAMILY HISTORY: Kidney Stones - Mother   SOCIAL HISTORY: Marital Status: Single Preferred Language: English;  Ethnicity: Not Hispanic Or Latino; Race: Black or African American Current Smoking Status: Patient smokes.   Tobacco Use Assessment Completed: Used Tobacco in last 30 days? Has never drank.  Drinks 1 caffeinated drink per day.    REVIEW OF SYSTEMS:    GU Review Female:   Patient reports frequent urination. Patient denies hard to postpone urination, burning /pain with urination, get up at night to urinate, leakage of urine, stream starts and stops, trouble starting your stream, have to strain to urinate, and being pregnant.  Gastrointestinal (Upper):   Patient denies nausea, vomiting, and indigestion/ heartburn.  Gastrointestinal (Lower):   Patient denies diarrhea and constipation.  Constitutional:   Patient denies fever, night sweats, weight loss, and fatigue.  Skin:   Patient denies skin rash/ lesion and itching.  Eyes:   Patient denies blurred vision and double vision.  Ears/ Nose/ Throat:   Patient denies sinus problems and sore throat.  Musculoskeletal:   Patient denies back pain and joint pain.  Neurological:   Patient denies headaches and dizziness.  Psychologic:   Patient denies depression and anxiety.   Notes: Patient stated she is suppose to get a scan first but not sure.    VITAL SIGNS:      06/06/2022 03:18 PM  BP 100/65 mmHg  Pulse 118 /min  Temperature 98.0 F / 36.6 C   MULTI-SYSTEM PHYSICAL EXAMINATION:    Constitutional: Well-nourished. No physical deformities. Normally developed. Good grooming.  Respiratory: No labored breathing,  no use of accessory muscles.   Cardiovascular: Normal temperature, normal extremity pulses, no swelling, no varicosities.  Skin: No paleness, no jaundice, no cyanosis. No lesion, no ulcer, no rash.  Neurologic / Psychiatric: Oriented to time, oriented to place, oriented to person. No depression, no anxiety, no agitation.  Gastrointestinal: No mass, no tenderness, no rigidity, non obese abdomen.     Complexity of Data:  Source Of  History:  Patient  Records Review:   Previous Doctor Records, Previous Hospital Records, Previous Patient Records  Urine Test Review:   Urinalysis   06/06/22  Urinalysis  Urine Appearance Slightly Cloudy   Urine Color Yellow   Urine Glucose Neg mg/dL  Urine Bilirubin Neg mg/dL  Urine Ketones Neg mg/dL  Urine Specific Gravity 1.025   Urine Blood 3+ ery/uL  Urine pH 6.0   Urine Protein 1+ mg/dL  Urine Urobilinogen 0.2 mg/dL  Urine Nitrites Neg   Urine Leukocyte Esterase 3+ leu/uL  Urine WBC/hpf 6 - 10/hpf   Urine RBC/hpf 40 - 60/hpf   Urine Epithelial Cells 10 - 20/hpf   Urine Bacteria Many (>50/hpf)   Urine Mucous Present   Urine Yeast NS (Not Seen)   Urine Trichomonas Not Present   Urine Cystals NS (Not Seen)   Urine Casts NS (Not Seen)   Urine Sperm Not Present    PROCEDURES:          Urinalysis w/Scope Dipstick Dipstick Cont'd Micro  Color: Yellow Bilirubin: Neg mg/dL WBC/hpf: 6 - 10/hpf  Appearance: Slightly Cloudy Ketones: Neg mg/dL RBC/hpf: 40 - 60/hpf  Specific Gravity: 1.025 Blood: 3+ ery/uL Bacteria: Many (>50/hpf)  pH: 6.0 Protein: 1+ mg/dL Cystals: NS (Not Seen)  Glucose: Neg mg/dL Urobilinogen: 0.2 mg/dL Casts: NS (Not Seen)    Nitrites: Neg Trichomonas: Not Present    Leukocyte Esterase: 3+ leu/uL Mucous: Present      Epithelial Cells: 10 - 20/hpf      Yeast: NS (Not Seen)      Sperm: Not Present    ASSESSMENT:      ICD-10 Details  1 GU:   Ureteral calculus - N20.1 Right, Acute, Uncomplicated   PLAN:           Orders Labs CULTURE, URINE          Document Letter(s):  Created for Patient: Clinical Summary         Notes:   I will send her urine for precautionary culture and follow-up with her according to those results. I I discussed the patient for stent removal with Dr. Claudia Desanctis and she advised that if the patient would like to remove the stent we could remove it and see how she does or the other option is the patient can go back for a second look to  ensure passage or fragmentation of prior stone. The patient would like to go for a second look and a green sheet was placed today.   Urinalysis will be sent for precautionary culture today. Stone intervention was discussed in detail today. For ureteroscopy, the patient understands that there is a chance for a staged procedure. Patient also understands that there is risk for bleeding, infection, injury to surrounding organs, and general risks of anesthesia. The patient also understands the placement of a stent and the risks of stent placement including, risk for infection, the risk for pain, and the risk for injury. For ESWL, the patient understands that there is a chance of failure of procedure, there is also a risk for bruising,  infection, bleeding, and injury to surrounding structures. The patient verbalized understanding to these risks.

## 2022-06-20 NOTE — Discharge Instructions (Signed)
DISCHARGE INSTRUCTIONS FOR KIDNEY STONE/URETERAL STENT   MEDICATIONS:  1. Resume all your other meds from home  2. AZO over the counter can help with the burning/stinging when you urinate. 3. Tramadol is for moderate/severe pain, otherwise taking up to 1000 mg every 6 hours of plainTylenol will help treat your pain.   4. Take Cipro one hour prior to removal of your stent.    ACTIVITY:  1. No strenuous activity x 1week  2. No driving while on narcotic pain medications  3. Drink plenty of water  4. Continue to walk at home - you can still get blood clots when you are at home, so keep active, but don't over do it.  5. May return to work/school tomorrow or when you feel ready   BATHING:  1. You can shower and we recommend daily showers  2. You have a string coming from your urethra: The stent string is attached to your ureteral stent. Do not pull on this.   SIGNS/SYMPTOMS TO CALL:  Please call us if you have a fever greater than 101.5, uncontrolled nausea/vomiting, uncontrolled pain, dizziness, unable to urinate, bloody urine, chest pain, shortness of breath, leg swelling, leg pain, redness around wound, drainage from wound, or any other concerns or questions.   You can reach [place] at [phone].   FOLLOW-UP:  1. You have a string attached to your stent, you may remove it on Friday, 2/2. To do this, pull the string until the stent is completely removed. You may feel an odd sensation in your back.  Take 1 tab of Cipro just prior to stent removal.

## 2022-06-21 ENCOUNTER — Ambulatory Visit: Payer: Medicaid Other | Admitting: Physical Therapy

## 2022-06-21 ENCOUNTER — Encounter (HOSPITAL_COMMUNITY): Payer: Self-pay | Admitting: Urology

## 2022-06-21 NOTE — Anesthesia Postprocedure Evaluation (Signed)
Anesthesia Post Note  Patient: Heather Fox  Procedure(s) Performed: CYSTOSCOPY WITH RETROGRADE PYELOGRAM, URETEROSCOPY AND STENT PLACEMENT (Right)     Patient location during evaluation: PACU Anesthesia Type: General Level of consciousness: awake Pain management: pain level controlled Vital Signs Assessment: post-procedure vital signs reviewed and stable Respiratory status: spontaneous breathing, nonlabored ventilation and respiratory function stable Cardiovascular status: blood pressure returned to baseline and stable Postop Assessment: no apparent nausea or vomiting Anesthetic complications: no   No notable events documented.  Last Vitals:  Vitals:   06/20/22 1530 06/20/22 1555  BP: 120/87 120/79  Pulse: 74 79  Resp: 15 20  Temp: 36.6 C 36.6 C  SpO2: 98% 99%    Last Pain:  Vitals:   06/20/22 1555  TempSrc:   PainSc: 0-No pain                 Vyla Pint P Carrina Schoenberger

## 2022-06-24 ENCOUNTER — Inpatient Hospital Stay (HOSPITAL_COMMUNITY)
Admission: EM | Admit: 2022-06-24 | Discharge: 2022-06-26 | DRG: 690 | Disposition: A | Discharge status: Home / Self Care | Payer: Medicaid Other | Attending: Internal Medicine | Admitting: Internal Medicine

## 2022-06-24 ENCOUNTER — Encounter (HOSPITAL_COMMUNITY): Payer: Self-pay

## 2022-06-24 ENCOUNTER — Other Ambulatory Visit: Payer: Self-pay

## 2022-06-24 ENCOUNTER — Emergency Department (HOSPITAL_COMMUNITY): Payer: Medicaid Other

## 2022-06-24 DIAGNOSIS — Z79899 Other long term (current) drug therapy: Secondary | ICD-10-CM

## 2022-06-24 DIAGNOSIS — N10 Acute tubulo-interstitial nephritis: Secondary | ICD-10-CM | POA: Diagnosis present

## 2022-06-24 DIAGNOSIS — N12 Tubulo-interstitial nephritis, not specified as acute or chronic: Principal | ICD-10-CM

## 2022-06-24 DIAGNOSIS — Z1152 Encounter for screening for COVID-19: Secondary | ICD-10-CM | POA: Diagnosis not present

## 2022-06-24 DIAGNOSIS — E876 Hypokalemia: Secondary | ICD-10-CM | POA: Diagnosis present

## 2022-06-24 DIAGNOSIS — D509 Iron deficiency anemia, unspecified: Secondary | ICD-10-CM | POA: Diagnosis present

## 2022-06-24 DIAGNOSIS — D649 Anemia, unspecified: Secondary | ICD-10-CM | POA: Insufficient documentation

## 2022-06-24 DIAGNOSIS — Z823 Family history of stroke: Secondary | ICD-10-CM

## 2022-06-24 DIAGNOSIS — F17210 Nicotine dependence, cigarettes, uncomplicated: Secondary | ICD-10-CM | POA: Diagnosis present

## 2022-06-24 DIAGNOSIS — K5900 Constipation, unspecified: Secondary | ICD-10-CM | POA: Diagnosis present

## 2022-06-24 DIAGNOSIS — Z886 Allergy status to analgesic agent status: Secondary | ICD-10-CM

## 2022-06-24 DIAGNOSIS — Z87442 Personal history of urinary calculi: Secondary | ICD-10-CM

## 2022-06-24 DIAGNOSIS — K219 Gastro-esophageal reflux disease without esophagitis: Secondary | ICD-10-CM | POA: Diagnosis present

## 2022-06-24 DIAGNOSIS — R6510 Systemic inflammatory response syndrome (SIRS) of non-infectious origin without acute organ dysfunction: Secondary | ICD-10-CM | POA: Insufficient documentation

## 2022-06-24 LAB — URINALYSIS, W/ REFLEX TO CULTURE (INFECTION SUSPECTED)
Bacteria, UA: NONE SEEN
Bilirubin Urine: NEGATIVE
Bilirubin Urine: NEGATIVE
Glucose, UA: NEGATIVE mg/dL
Glucose, UA: NEGATIVE mg/dL
Ketones, ur: 20 mg/dL — AB
Ketones, ur: 40 mg/dL — AB
Leukocytes,Ua: NEGATIVE
Nitrite: NEGATIVE
Nitrite: NEGATIVE
Protein, ur: 100 mg/dL — AB
Protein, ur: 30 mg/dL — AB
Specific Gravity, Urine: 1.025 (ref 1.005–1.030)
Specific Gravity, Urine: <1.005 — ABNORMAL LOW (ref 1.005–1.030)
pH: 5 (ref 5.0–8.0)
pH: 7 (ref 5.0–8.0)

## 2022-06-24 LAB — RESP PANEL BY RT-PCR (RSV, FLU A&B, COVID)  RVPGX2
Influenza A by PCR: NEGATIVE
Influenza B by PCR: NEGATIVE
Resp Syncytial Virus by PCR: NEGATIVE
SARS Coronavirus 2 by RT PCR: NEGATIVE

## 2022-06-24 LAB — CBC WITH DIFFERENTIAL/PLATELET
Abs Immature Granulocytes: 0.08 10*3/uL — ABNORMAL HIGH (ref 0.00–0.07)
Basophils Absolute: 0 10*3/uL (ref 0.0–0.1)
Basophils Relative: 0 %
Eosinophils Absolute: 0 10*3/uL (ref 0.0–0.5)
Eosinophils Relative: 0 %
HCT: 36.9 % (ref 36.0–46.0)
Hemoglobin: 11.5 g/dL — ABNORMAL LOW (ref 12.0–15.0)
Immature Granulocytes: 0 %
Lymphocytes Relative: 10 %
Lymphs Abs: 1.9 10*3/uL (ref 0.7–4.0)
MCH: 25.5 pg — ABNORMAL LOW (ref 26.0–34.0)
MCHC: 31.2 g/dL (ref 30.0–36.0)
MCV: 81.8 fL (ref 80.0–100.0)
Monocytes Absolute: 2 10*3/uL — ABNORMAL HIGH (ref 0.1–1.0)
Monocytes Relative: 11 %
Neutro Abs: 14.7 10*3/uL — ABNORMAL HIGH (ref 1.7–7.7)
Neutrophils Relative %: 79 %
Platelets: 205 10*3/uL (ref 150–400)
RBC: 4.51 MIL/uL (ref 3.87–5.11)
RDW: 15.5 % (ref 11.5–15.5)
WBC: 18.7 10*3/uL — ABNORMAL HIGH (ref 4.0–10.5)
nRBC: 0 % (ref 0.0–0.2)

## 2022-06-24 LAB — IRON AND TIBC
Iron: 10 ug/dL — ABNORMAL LOW (ref 28–170)
Saturation Ratios: 3 % — ABNORMAL LOW (ref 10.4–31.8)
TIBC: 389 ug/dL (ref 250–450)
UIBC: 379 ug/dL

## 2022-06-24 LAB — COMPREHENSIVE METABOLIC PANEL
ALT: 19 U/L (ref 0–44)
AST: 22 U/L (ref 15–41)
Albumin: 4 g/dL (ref 3.5–5.0)
Alkaline Phosphatase: 69 U/L (ref 38–126)
Anion gap: 14 (ref 5–15)
BUN: 16 mg/dL (ref 6–20)
CO2: 26 mmol/L (ref 22–32)
Calcium: 9.5 mg/dL (ref 8.9–10.3)
Chloride: 96 mmol/L — ABNORMAL LOW (ref 98–111)
Creatinine, Ser: 0.97 mg/dL (ref 0.44–1.00)
GFR, Estimated: >60 mL/min (ref >60)
Glucose, Bld: 99 mg/dL (ref 70–99)
Potassium: 3.4 mmol/L — ABNORMAL LOW (ref 3.5–5.1)
Sodium: 136 mmol/L (ref 135–145)
Total Bilirubin: 0.5 mg/dL (ref 0.3–1.2)
Total Protein: 8.4 g/dL — ABNORMAL HIGH (ref 6.5–8.1)

## 2022-06-24 LAB — PROTIME-INR
INR: 1.2 (ref 0.8–1.2)
Prothrombin Time: 15.2 seconds (ref 11.4–15.2)

## 2022-06-24 LAB — APTT: aPTT: 25 seconds (ref 24–36)

## 2022-06-24 LAB — LACTIC ACID, PLASMA
Lactic Acid, Venous: 1.5 mmol/L (ref 0.5–1.9)
Lactic Acid, Venous: 1.1 mmol/L (ref 0.5–1.9)

## 2022-06-24 LAB — MAGNESIUM: Magnesium: 1.7 mg/dL (ref 1.7–2.4)

## 2022-06-24 LAB — PREGNANCY, URINE: Preg Test, Ur: NEGATIVE

## 2022-06-24 LAB — I-STAT BETA HCG BLOOD, ED (MC, WL, AP ONLY): I-stat hCG, quantitative: 9.4 m[IU]/mL — ABNORMAL HIGH (ref <5)

## 2022-06-24 LAB — LIPASE, BLOOD: Lipase: 39 U/L (ref 11–51)

## 2022-06-24 MED ORDER — SODIUM CHLORIDE 0.9 % IV BOLUS
1000.0000 mL | Freq: Once | INTRAVENOUS | Status: AC
  Administered 2022-06-24: 1000 mL via INTRAVENOUS

## 2022-06-24 MED ORDER — DOCUSATE SODIUM 100 MG PO CAPS
100.0000 mg | ORAL_CAPSULE | Freq: Two times a day (BID) | ORAL | Status: DC
  Filled 2022-06-24: qty 1

## 2022-06-24 MED ORDER — FENTANYL CITRATE PF 50 MCG/ML IJ SOSY
50.0000 ug | PREFILLED_SYRINGE | Freq: Once | INTRAMUSCULAR | Status: AC
  Administered 2022-06-24: 50 ug via INTRAVENOUS
  Filled 2022-06-24: qty 1

## 2022-06-24 MED ORDER — IOHEXOL 300 MG/ML  SOLN
100.0000 mL | Freq: Once | INTRAMUSCULAR | Status: AC | PRN
  Administered 2022-06-24: 100 mL via INTRAVENOUS

## 2022-06-24 MED ORDER — SODIUM CHLORIDE 0.9 % IV SOLN
2.0000 g | Freq: Three times a day (TID) | INTRAVENOUS | Status: DC
  Administered 2022-06-24 – 2022-06-26 (×6): 2 g via INTRAVENOUS
  Filled 2022-06-24 (×6): qty 12.5

## 2022-06-24 MED ORDER — POTASSIUM CHLORIDE CRYS ER 20 MEQ PO TBCR
40.0000 meq | EXTENDED_RELEASE_TABLET | Freq: Every day | ORAL | Status: DC
  Administered 2022-06-24: 40 meq via ORAL
  Filled 2022-06-24: qty 2

## 2022-06-24 MED ORDER — SODIUM CHLORIDE 0.9 % IV BOLUS (SEPSIS)
1000.0000 mL | Freq: Once | INTRAVENOUS | Status: AC
  Administered 2022-06-24: 1000 mL via INTRAVENOUS

## 2022-06-24 MED ORDER — SODIUM CHLORIDE 0.9 % IV SOLN
2.0000 g | Freq: Once | INTRAVENOUS | Status: AC
  Administered 2022-06-24: 2 g via INTRAVENOUS
  Filled 2022-06-24: qty 12.5

## 2022-06-24 MED ORDER — HYDROMORPHONE HCL 1 MG/ML IJ SOLN
1.0000 mg | Freq: Once | INTRAMUSCULAR | Status: AC
  Administered 2022-06-24: 1 mg via INTRAVENOUS
  Filled 2022-06-24: qty 1

## 2022-06-24 MED ORDER — ACETAMINOPHEN 650 MG RE SUPP: 650.0000 mg | Freq: Four times a day (QID) | RECTAL | Status: DC | PRN

## 2022-06-24 MED ORDER — ACETAMINOPHEN 325 MG PO TABS: 650.0000 mg | ORAL_TABLET | Freq: Four times a day (QID) | ORAL | Status: DC | PRN

## 2022-06-24 MED ORDER — LACTATED RINGERS IV SOLN: INTRAVENOUS | Status: DC

## 2022-06-24 MED ORDER — SODIUM CHLORIDE 0.9 % IV SOLN
2.0000 g | INTRAVENOUS | Status: DC
  Administered 2022-06-24: 2 g via INTRAVENOUS
  Filled 2022-06-24: qty 20

## 2022-06-24 MED ORDER — ONDANSETRON HCL 4 MG/2ML IJ SOLN
4.0000 mg | Freq: Once | INTRAMUSCULAR | Status: AC
  Administered 2022-06-24: 4 mg via INTRAVENOUS
  Filled 2022-06-24: qty 2

## 2022-06-24 MED ORDER — LIP MEDEX EX OINT: TOPICAL_OINTMENT | CUTANEOUS | Status: DC | PRN

## 2022-06-24 MED ORDER — SODIUM CHLORIDE 0.9 % IV SOLN: INTRAVENOUS | Status: DC

## 2022-06-24 MED ORDER — PROCHLORPERAZINE EDISYLATE 10 MG/2ML IJ SOLN: 10.0000 mg | Freq: Four times a day (QID) | INTRAMUSCULAR | Status: DC | PRN

## 2022-06-24 MED ORDER — OXYCODONE HCL 5 MG PO TABS
10.0000 mg | ORAL_TABLET | ORAL | Status: DC | PRN
  Administered 2022-06-25 – 2022-06-26 (×3): 10 mg via ORAL
  Filled 2022-06-24 (×3): qty 2

## 2022-06-24 MED ORDER — FENTANYL CITRATE PF 50 MCG/ML IJ SOSY
25.0000 ug | PREFILLED_SYRINGE | INTRAMUSCULAR | Status: DC | PRN
  Administered 2022-06-24 – 2022-06-25 (×3): 25 ug via INTRAVENOUS
  Filled 2022-06-24 (×3): qty 1

## 2022-06-24 MED ORDER — POLYETHYLENE GLYCOL 3350 17 G PO PACK: 17.0000 g | PACK | Freq: Every day | ORAL | Status: DC | PRN

## 2022-06-24 NOTE — H&P (Signed)
History and Physical    Patient: Heather Fox PIR:[id number] DOB: 07/09/1995 DOA: 06/24/2022 DOS: the patient was seen and examined on 06/24/2022 PCP: [facility]  Patient coming from: Home  Chief Complaint:  Chief Complaint  Patient presents with   Emesis   HPI: Heather Fox Heather Fox is a 27 y.o. female with medical history significant of hydronephrosis. Presenting with right flank pain, N/V. She reports that she had a ureteral stent placed on 06/20/22. After going home, she started having N/V. She was unable to keep anything down. So notice over the next 2 days she was developing fevers. Her N/V persisted. She developed flank pain. She spoke with her urology team and they recommended that she pull her stent. So she did, but it did not help her symptoms. When her symptom did not improve this morning, she decided to come to the ED for evaluation. She denies any other aggravating or alleviating factors.    Review of Systems: As mentioned in the history of present illness. All other systems reviewed and are negative. Past Medical History:  Diagnosis Date   Anemia    GERD (gastroesophageal reflux disease)    History of kidney stones    Migraine    Past Surgical History:  Procedure Laterality Date   CYSTOSCOPY WITH RETROGRADE PYELOGRAM, URETEROSCOPY AND STENT PLACEMENT Right 05/30/2022   Procedure: CYSTOSCOPY WITH RETROGRADE PYELOGRAM, URETEROSCOPY, HOLMIUM LASER AND STENT PLACEMENT;  Surgeon: Noel Christmas, MD;  Location: [facility];  Service: Urology;  Laterality: Right;   CYSTOSCOPY WITH RETROGRADE PYELOGRAM, URETEROSCOPY AND STENT PLACEMENT Right 06/20/2022   Procedure: CYSTOSCOPY WITH RETROGRADE PYELOGRAM, URETEROSCOPY AND STENT PLACEMENT;  Surgeon: Noel Christmas, MD;  Location: [facility];  Service: Urology;  Laterality: Right;  1 HR   NO PAST SURGERIES     WISDOM TOOTH EXTRACTION     Social History:  reports that she has been smoking cigarettes. She has  been smoking an average of .25 packs per day. She has never used smokeless tobacco. She reports that she does not currently use drugs after having used the following drugs: Marijuana. She reports that she does not drink alcohol.  Allergies  Allergen Reactions   Aleve [Naproxen] Other (See Comments)    Swollen Throat    Family History  Problem Relation Age of Onset   Stroke Mother    Cancer Neg Hx    Diabetes Neg Hx    Heart disease Neg Hx    Hypertension Neg Hx    Hearing loss Neg Hx     Prior to Admission medications   Medication Sig Start Date End Date Taking? Authorizing Provider  celecoxib (CELEBREX) 100 MG capsule Take 1 capsule (100 mg total) by mouth 2 (two) times daily. Patient not taking: Reported on 06/13/2022 05/09/22   Lorriane Shire, MD  cyclobenzaprine (FLEXERIL) 10 MG tablet Take 1 tablet (10 mg total) by mouth at bedtime as needed for muscle spasms. Patient not taking: Reported on 06/13/2022 05/09/22   Lorriane Shire, MD  Galcanezumab-gnlm (EMGALITY) 120 MG/ML SOAJ Inject 1 Pen into the skin every 30 (thirty) days. Patient not taking: Reported on 06/13/2022 01/30/22   Ocie Doyne, MD  metroNIDAZOLE (FLAGYL) 500 MG tablet Take 1 tablet (500 mg total) by mouth 2 (two) times daily. Patient not taking: Reported on 05/09/2022 04/25/22   Constant, Peggy, MD  metroNIDAZOLE (METROGEL) 0.75 % vaginal gel Place 1 Applicatorful vaginally at bedtime. Apply one applicatorful to vagina at bedtime for 5 days  Patient not taking: Reported on 05/09/2022 05/01/22   Constant, Peggy, MD  nitrofurantoin, macrocrystal-monohydrate, (MACROBID) 100 MG capsule Take 100 mg by mouth 2 (two) times daily.    [provider]  norethindrone (AYGESTIN) 5 MG tablet Take 2 tablets (10 mg total) by mouth daily. Patient not taking: Reported on 05/09/2022 04/18/22   Chancy Milroy, MD  norethindrone-ethinyl estradiol Lorraine Lax) 0.4-35 MG-MCG tablet Take 3 tabletsdaily the first 3 days,  followed by 2 tablets daily the next 3 days and 1 tablet daily until end of active pills, skipping the sugar pills Patient not taking: Reported on 05/09/2022 04/24/22   Constant, Peggy, MD  norethindrone-ethinyl estradiol (OVCON-35) 0.4-35 MG-MCG tablet Take 1 tablet by mouth daily. Patient not taking: Reported on 05/09/2022 04/24/22   Constant, Peggy, MD  oxyCODONE (ROXICODONE) 5 MG immediate release tablet Take 1 tablet (5 mg total) by mouth every 8 (eight) hours as needed. 05/30/22 05/30/23  Robley Fries, MD  oxyCODONE-acetaminophen (PERCOCET/ROXICET) 5-325 MG tablet Take 1 tablet by mouth every 4 (four) hours as needed. Patient not taking: Reported on 05/09/2022 04/18/22   Chancy Milroy, MD  Rimegepant Sulfate (NURTEC) 75 MG TBDP Take 75 mg by mouth as needed (for migraines). Max dose 1 pill in 24 hours 01/30/22   Genia Harold, MD  tamsulosin (FLOMAX) 0.4 MG CAPS capsule Take 0.4 mg by mouth daily as needed (urinary discomfort).    [provider]    Physical Exam: Vitals:   06/24/22 1015 06/24/22 1349 06/24/22 1400 06/24/22 1402  BP: 107/63 106/71 115/72 115/72  Pulse: 95 88 92 93  Resp: (!) 23 (!) 22 12 15   Temp:  98.9 F (37.2 C)    TempSrc:  Oral    SpO2: 99%  99% 100%   General: 27 y.o. female resting in bed in NAD Eyes: PERRL, normal sclera ENMT: Nares patent w/o discharge, orophaynx clear, dentition normal, ears w/o discharge/lesions/ulcers Neck: Supple, trachea midline Cardiovascular: RRR, +S1, S2, no m/g/r, equal pulses throughout Respiratory: CTABL, no w/r/r, normal WOB GI: BS+, ND, RLQ/right flank TTP, no masses noted, no organomegaly noted MSK: No e/c/c Neuro: A&O x 3, no focal deficits Psyc: Appropriate interaction and affect, calm/cooperative  Data Reviewed:  Results for orders placed or performed during the hospital encounter of 06/24/22 (from the past 24 hour(s))  CBC with Differential     Status: Abnormal   Collection Time: 06/24/22 10:05 AM   Result Value Ref Range   WBC 18.7 (H) 4.0 - 10.5 K/uL   RBC 4.51 3.87 - 5.11 MIL/uL   Hemoglobin 11.5 (L) 12.0 - 15.0 g/dL   HCT 36.9 36.0 - 46.0 %   MCV 81.8 80.0 - 100.0 fL   MCH 25.5 (L) 26.0 - 34.0 pg   MCHC 31.2 30.0 - 36.0 g/dL   RDW 15.5 11.5 - 15.5 %   Platelets 205 150 - 400 K/uL   nRBC 0.0 0.0 - 0.2 %   Neutrophils Relative % 79 %   Neutro Abs 14.7 (H) 1.7 - 7.7 K/uL   Lymphocytes Relative 10 %   Lymphs Abs 1.9 0.7 - 4.0 K/uL   Monocytes Relative 11 %   Monocytes Absolute 2.0 (H) 0.1 - 1.0 K/uL   Eosinophils Relative 0 %   Eosinophils Absolute 0.0 0.0 - 0.5 K/uL   Basophils Relative 0 %   Basophils Absolute 0.0 0.0 - 0.1 K/uL   Immature Granulocytes 0 %   Abs Immature Granulocytes 0.08 (H) 0.00 - 0.07 K/uL  Comprehensive metabolic panel     Status: Abnormal   Collection Time: 06/24/22 10:05 AM  Result Value Ref Range   Sodium 136 135 - 145 mmol/L   Potassium 3.4 (L) 3.5 - 5.1 mmol/L   Chloride 96 (L) 98 - 111 mmol/L   CO2 26 22 - 32 mmol/L   Glucose, Bld 99 70 - 99 mg/dL   BUN 16 6 - 20 mg/dL   Creatinine, Ser 0.97 0.44 - 1.00 mg/dL   Calcium 9.5 8.9 - 10.3 mg/dL   Total Protein 8.4 (H) 6.5 - 8.1 g/dL   Albumin 4.0 3.5 - 5.0 g/dL   AST 22 15 - 41 U/L   ALT 19 0 - 44 U/L   Alkaline Phosphatase 69 38 - 126 U/L   Total Bilirubin 0.5 0.3 - 1.2 mg/dL   GFR, Estimated >60 >60 mL/min   Anion gap 14 5 - 15  Lipase, blood     Status: None   Collection Time: 06/24/22 10:05 AM  Result Value Ref Range   Lipase 39 11 - 51 U/L  Lactic acid, plasma     Status: None   Collection Time: 06/24/22 10:05 AM  Result Value Ref Range   Lactic Acid, Venous 1.5 0.5 - 1.9 mmol/L  I-Stat Beta hCG blood, ED ([facility], [facility], [facility] only)     Status: Abnormal   Collection Time: 06/24/22 10:23 AM  Result Value Ref Range   I-stat hCG, quantitative 9.4 (H) <5 mIU/mL   Comment 3          Urinalysis, w/ Reflex to Culture (Infection Suspected) -Urine, Clean Catch     Status: Abnormal   Collection  Time: 06/24/22 11:35 AM  Result Value Ref Range   Specimen Source URINE, CLEAN CATCH    Color, Urine YELLOW YELLOW   APPearance CLOUDY (A) CLEAR   Specific Gravity, Urine 1.025 1.005 - 1.030   pH 5.0 5.0 - 8.0   Glucose, UA NEGATIVE NEGATIVE mg/dL   Hgb urine dipstick SMALL (A) NEGATIVE   Bilirubin Urine NEGATIVE NEGATIVE   Ketones, ur 20 (A) NEGATIVE mg/dL   Protein, ur 100 (A) NEGATIVE mg/dL   Nitrite NEGATIVE NEGATIVE   Leukocytes,Ua SMALL (A) NEGATIVE   RBC / HPF 11-20 0 - 5 RBC/hpf   WBC, UA 21-50 0 - 5 WBC/hpf   Bacteria, UA RARE (A) NONE SEEN   Squamous Epithelial / HPF 21-50 0 - 5 /HPF   Mucus PRESENT   Pregnancy, urine     Status: None   Collection Time: 06/24/22 11:35 AM  Result Value Ref Range   Preg Test, Ur NEGATIVE NEGATIVE  Resp panel by RT-PCR (RSV, Flu A&B, Covid) Anterior Nasal Swab     Status: None   Collection Time: 06/24/22  2:08 PM   Specimen: Anterior Nasal Swab  Result Value Ref Range   SARS Coronavirus 2 by RT PCR NEGATIVE NEGATIVE   Influenza A by PCR NEGATIVE NEGATIVE   Influenza B by PCR NEGATIVE NEGATIVE   Resp Syncytial Virus by PCR NEGATIVE NEGATIVE  Protime-INR     Status: None   Collection Time: 06/24/22  2:34 PM  Result Value Ref Range   Prothrombin Time 15.2 11.4 - 15.2 seconds   INR 1.2 0.8 - 1.2  APTT     Status: None   Collection Time: 06/24/22  2:34 PM  Result Value Ref Range   aPTT 25 24 - 36 seconds   CT ab/pelvis 1. Development of 2 areas of focal  right renal hypoenhancement most consistent with focal pyelonephritis/lobar nephronia. No drainable abscess. 2. Resolution of previously described right-sided hydronephrosis. 3.  Possible constipation.  CXR: No active disease.   Assessment and Plan: Acute pyelonephritis SIRS     - admit to inpt, tele     - continue cefepime     - follow UCx     - fluids, pain control, anti-emetics  Hypokalemia     - replace K+, check Mg2+  Normocytic anemia     - no evidence of bleed      - check iron studies  Advance Care Planning:   Code Status: FULL  Consults: None  Family Communication: None at bedside  Severity of Illness: The appropriate patient status for this patient is INPATIENT. Inpatient status is judged to be reasonable and necessary in order to provide the required intensity of service to ensure the patient's safety. The patient's presenting symptoms, physical exam findings, and initial radiographic and laboratory data in the context of their chronic comorbidities is felt to place them at high risk for further clinical deterioration. Furthermore, it is not anticipated that the patient will be medically stable for discharge from the hospital within 2 midnights of admission.   * I certify that at the point of admission it is my clinical judgment that the patient will require inpatient hospital care spanning beyond 2 midnights from the point of admission due to high intensity of service, high risk for further deterioration and high frequency of surveillance required.*  Author: Jonnie Finner, DO 06/24/2022 3:39 PM  For on call review [email or web address].

## 2022-06-24 NOTE — ED Triage Notes (Addendum)
Patient had a right lithotripsy Tuesday. A stent was placed and she took it out yesterday. She said she cannot stop vomiting, chills, fever (she cannot remember what is was at home). Right flank pain.

## 2022-06-24 NOTE — ED Provider Notes (Signed)
[place] EMERGENCY DEPARTMENT AT [facility] Provider Note   CSN: [id number] Arrival date & time: 06/24/22  7017     History  Chief Complaint  Patient presents with   Emesis    Heather Fox is a 27 y.o. female with a past medical history of migraine and kidney stone status post stent placement on 06/20/2022 presenting today for evaluation of right flank pain.  She reports in the last 3 days she has had constant, sharp, right-sided flank pain.  She removed her stent yesterday following urology instruction.  She reports seeing blood in her urine the last couple days.  She endorses nausea, vomiting, unable to keep anything down.  She reports taking pain medication however she would vomit after taking the pills. She has also tried promethazine but reports no improvement. Denies chest pain, shortness of breath.   Emesis   Past Medical History:  Diagnosis Date   Anemia    GERD (gastroesophageal reflux disease)    History of kidney stones    Migraine    Past Surgical History:  Procedure Laterality Date   CYSTOSCOPY WITH RETROGRADE PYELOGRAM, URETEROSCOPY AND STENT PLACEMENT Right 05/30/2022   Procedure: CYSTOSCOPY WITH RETROGRADE PYELOGRAM, URETEROSCOPY, HOLMIUM LASER AND STENT PLACEMENT;  Surgeon: Robley Fries, MD;  Location: [facility];  Service: Urology;  Laterality: Right;   CYSTOSCOPY WITH RETROGRADE PYELOGRAM, URETEROSCOPY AND STENT PLACEMENT Right 06/20/2022   Procedure: CYSTOSCOPY WITH RETROGRADE PYELOGRAM, URETEROSCOPY AND STENT PLACEMENT;  Surgeon: Robley Fries, MD;  Location: [facility];  Service: Urology;  Laterality: Right;  1 HR   NO PAST SURGERIES     WISDOM TOOTH EXTRACTION       Home Medications Prior to Admission medications   Medication Sig Start Date End Date Taking? Authorizing Provider  acetaminophen (TYLENOL) 500 MG tablet Take 500 mg by mouth every 6 (six) hours as needed for mild pain.   Yes [provider]  oxyCODONE (ROXICODONE)  5 MG immediate release tablet Take 1 tablet (5 mg total) by mouth every 8 (eight) hours as needed. Patient taking differently: Take 5 mg by mouth every 8 (eight) hours as needed for moderate pain. 05/30/22 05/30/23 Yes Pace, Johny Chess, MD  promethazine (PHENERGAN) 25 MG tablet Take 25 mg by mouth every 6 (six) hours as needed for nausea or vomiting. 06/22/22  Yes [provider]  celecoxib (CELEBREX) 100 MG capsule Take 1 capsule (100 mg total) by mouth 2 (two) times daily. Patient not taking: Reported on 06/13/2022 05/09/22   Darliss Cheney, MD  cyclobenzaprine (FLEXERIL) 10 MG tablet Take 1 tablet (10 mg total) by mouth at bedtime as needed for muscle spasms. Patient not taking: Reported on 06/13/2022 05/09/22   Darliss Cheney, MD  Galcanezumab-gnlm (EMGALITY) 120 MG/ML SOAJ Inject 1 Pen into the skin every 30 (thirty) days. Patient not taking: Reported on 06/13/2022 01/30/22   Genia Harold, MD  metroNIDAZOLE (FLAGYL) 500 MG tablet Take 1 tablet (500 mg total) by mouth 2 (two) times daily. Patient not taking: Reported on 05/09/2022 04/25/22   Constant, Peggy, MD  metroNIDAZOLE (METROGEL) 0.75 % vaginal gel Place 1 Applicatorful vaginally at bedtime. Apply one applicatorful to vagina at bedtime for 5 days Patient not taking: Reported on 05/09/2022 05/01/22   Constant, Peggy, MD  nitrofurantoin, macrocrystal-monohydrate, (MACROBID) 100 MG capsule Take 100 mg by mouth 2 (two) times daily. Patient not taking: Reported on 06/24/2022    [provider]  norethindrone (AYGESTIN) 5 MG tablet Take 2 tablets (  10 mg total) by mouth daily. Patient not taking: Reported on 05/09/2022 04/18/22   Chancy Milroy, MD  norethindrone-ethinyl estradiol Lorraine Lax) 0.4-35 MG-MCG tablet Take 3 tabletsdaily the first 3 days, followed by 2 tablets daily the next 3 days and 1 tablet daily until end of active pills, skipping the sugar pills Patient not taking: Reported on 05/09/2022 04/24/22   Constant,  Peggy, MD  norethindrone-ethinyl estradiol (OVCON-35) 0.4-35 MG-MCG tablet Take 1 tablet by mouth daily. Patient not taking: Reported on 05/09/2022 04/24/22   Constant, Peggy, MD  oxyCODONE-acetaminophen (PERCOCET/ROXICET) 5-325 MG tablet Take 1 tablet by mouth every 4 (four) hours as needed. Patient not taking: Reported on 05/09/2022 04/18/22   Chancy Milroy, MD  Rimegepant Sulfate (NURTEC) 75 MG TBDP Take 75 mg by mouth as needed (for migraines). Max dose 1 pill in 24 hours Patient not taking: Reported on 06/24/2022 01/30/22   Genia Harold, MD      Allergies    Aleve [naproxen]    Review of Systems   Review of Systems  Gastrointestinal:  Positive for vomiting.    Physical Exam Updated Vital Signs BP 109/61 (BP Location: Left Arm)   Pulse 95   Temp 99.3 F (37.4 C) (Oral)   Resp 18   Ht 5\' 5"  (1.651 m)   Wt 70 kg   LMP 06/14/2022 (Approximate) Comment: 06-20-22 UA pregnancy test negative  SpO2 100%   BMI 25.68 kg/m  Physical Exam Vitals and nursing note reviewed.  Constitutional:      Appearance: Normal appearance.  HENT:     Head: Normocephalic and atraumatic.     Mouth/Throat:     Mouth: Mucous membranes are moist.  Eyes:     General: No scleral icterus. Cardiovascular:     Rate and Rhythm: Normal rate and regular rhythm.     Pulses: Normal pulses.     Heart sounds: Normal heart sounds.  Pulmonary:     Effort: Pulmonary effort is normal.     Breath sounds: Normal breath sounds.  Abdominal:     General: Abdomen is flat.     Palpations: Abdomen is soft.     Tenderness: There is no abdominal tenderness.  Musculoskeletal:        General: No deformity.     Comments: Tenderness to palpation to right flank, right upper quadrant and right lower quadrant.  Skin:    General: Skin is warm.     Findings: No rash.  Neurological:     General: No focal deficit present.     Mental Status: She is alert.  Psychiatric:        Mood and Affect: Mood normal.     ED  Results / Procedures / Treatments   Labs (all labs ordered are listed, but only abnormal results are displayed) Labs Reviewed  CBC WITH DIFFERENTIAL/PLATELET - Abnormal; Notable for the following components:      Result Value   WBC 18.7 (*)    Hemoglobin 11.5 (*)    MCH 25.5 (*)    Neutro Abs 14.7 (*)    Monocytes Absolute 2.0 (*)    Abs Immature Granulocytes 0.08 (*)    All other components within normal limits  COMPREHENSIVE METABOLIC PANEL - Abnormal; Notable for the following components:   Potassium 3.4 (*)    Chloride 96 (*)    Total Protein 8.4 (*)    All other components within normal limits  URINALYSIS, W/ REFLEX TO CULTURE (INFECTION SUSPECTED) - Abnormal; Notable for the  following components:   APPearance CLOUDY (*)    Hgb urine dipstick SMALL (*)    Ketones, ur 20 (*)    Protein, ur 100 (*)    Leukocytes,Ua SMALL (*)    Bacteria, UA RARE (*)    All other components within normal limits  URINALYSIS, W/ REFLEX TO CULTURE (INFECTION SUSPECTED) - Abnormal; Notable for the following components:   Specific Gravity, Urine <1.005 (*)    Hgb urine dipstick SMALL (*)    Ketones, ur 40 (*)    Protein, ur 30 (*)    All other components within normal limits  I-STAT BETA HCG BLOOD, ED ([facility], [facility], [facility] ONLY) - Abnormal; Notable for the following components:   I-stat hCG, quantitative 9.4 (*)    All other components within normal limits  RESP PANEL BY RT-PCR (RSV, FLU A&B, COVID)  RVPGX2  CULTURE, BLOOD (ROUTINE X 2)  CULTURE, BLOOD (ROUTINE X 2)  LIPASE, BLOOD  LACTIC ACID, PLASMA  LACTIC ACID, PLASMA  PREGNANCY, URINE  PROTIME-INR  APTT  MAGNESIUM  PROTIME-INR  CORTISOL-AM, BLOOD  PROCALCITONIN  COMPREHENSIVE METABOLIC PANEL  CBC  IRON AND TIBC    EKG None  Radiology DG Chest Port 1 View  Result Date: 06/24/2022 CLINICAL DATA:  Possible sepsis.  Chills and fever. EXAM: PORTABLE CHEST 1 VIEW COMPARISON:  05/22/2020 FINDINGS: Nipple piercings. Numerous leads and wires  project over the chest. Patient rotated left. Midline trachea.  Normal heart size and mediastinal contours. Sharp costophrenic angles.  No pneumothorax.  Clear lungs. Mild right hemidiaphragm elevation. IMPRESSION: No active disease. Electronically Signed   By: Abigail Miyamoto M.D.   On: 06/24/2022 14:25   CT ABDOMEN PELVIS W CONTRAST  Result Date: 06/24/2022 CLINICAL DATA:  Abdominal pain. Right flank pain. Stone removed 05/30/2022. EXAM: CT ABDOMEN AND PELVIS WITH CONTRAST TECHNIQUE: Multidetector CT imaging of the abdomen and pelvis was performed using the standard protocol following bolus administration of intravenous contrast. RADIATION DOSE REDUCTION: This exam was performed according to the departmental dose-optimization program which includes automated exposure control, adjustment of the mA and/or kV according to patient size and/or use of iterative reconstruction technique. CONTRAST:  128mL OMNIPAQUE IOHEXOL 300 MG/ML  SOLN COMPARISON:  05/27/2022 FINDINGS: Lower chest: Clear lung bases. Normal heart size without pericardial or pleural effusion. Hepatobiliary: Normal liver. Normal gallbladder, without biliary ductal dilatation. Pancreas: Normal, without mass or ductal dilatation. Spleen: Normal in size, without focal abnormality. Adrenals/Urinary Tract: Normal adrenal glands. Normal left kidney. The previously described right-sided hydroureteronephrosis has resolved. Interval development of areas of focal, rounded hypoattenuation within the right kidney including within the upper pole at 2.1 cm in 14/2 and interpolar region at 2.2 cm on 18/2. The previously described right pelvic calcification is no longer identified and has presumably been removed. There is a more posterior vascular calcification in the right hemipelvis on 63/2. Decompressed urinary bladder. Stomach/Bowel: Normal stomach, without wall thickening. Colonic stool burden suggests constipation. Normal terminal ileum. Appendix likely identified  and normal. Normal small bowel. Vascular/Lymphatic: Normal caliber of the aorta and branch vessels. No abdominopelvic adenopathy. Reproductive: Normal uterus and adnexa. Other: No significant free fluid.  No free intraperitoneal air. Musculoskeletal: No acute osseous abnormality. IMPRESSION: 1. Development of 2 areas of focal right renal hypoenhancement most consistent with focal pyelonephritis/lobar nephronia. No drainable abscess. 2. Resolution of previously described right-sided hydronephrosis. 3.  Possible constipation. Electronically Signed   By: Abigail Miyamoto M.D.   On: 06/24/2022 12:17    Procedures Procedures  Medications Ordered in ED Medications  prochlorperazine (COMPAZINE) injection 10 mg (has no administration in time range)  fentaNYL (SUBLIMAZE) injection 25 mcg (25 mcg Intravenous Given 06/24/22 1847)  0.9 %  sodium chloride infusion ( Intravenous New Bag/Given 06/24/22 1807)  acetaminophen (TYLENOL) tablet 650 mg (has no administration in time range)    Or  acetaminophen (TYLENOL) suppository 650 mg (has no administration in time range)  oxyCODONE (Oxy IR/ROXICODONE) immediate release tablet 10 mg (has no administration in time range)  docusate sodium (COLACE) capsule 100 mg (has no administration in time range)  polyethylene glycol (MIRALAX / GLYCOLAX) packet 17 g (has no administration in time range)  potassium chloride SA (KLOR-CON M) CR tablet 40 mEq (has no administration in time range)  ceFEPIme (MAXIPIME) 2 g in sodium chloride 0.9 % 100 mL IVPB (has no administration in time range)  lip balm (CARMEX) ointment (has no administration in time range)  ondansetron (ZOFRAN) injection 4 mg (4 mg Intravenous Given 06/24/22 1010)  HYDROmorphone (DILAUDID) injection 1 mg (1 mg Intravenous Given 06/24/22 1012)  sodium chloride 0.9 % bolus 1,000 mL (0 mLs Intravenous Stopped 06/24/22 1130)  fentaNYL (SUBLIMAZE) injection 50 mcg (50 mcg Intravenous Given 06/24/22 1108)  iohexol (OMNIPAQUE)  300 MG/ML solution 100 mL (100 mLs Intravenous Contrast Given 06/24/22 1151)  sodium chloride 0.9 % bolus 1,000 mL (0 mLs Intravenous Stopped 06/24/22 1700)  ceFEPIme (MAXIPIME) 2 g in sodium chloride 0.9 % 100 mL IVPB (0 g Intravenous Stopped 06/24/22 1700)  fentaNYL (SUBLIMAZE) injection 50 mcg (50 mcg Intravenous Given 06/24/22 1555)    ED Course/ Medical Decision Making/ A&P                             Medical Decision Making Amount and/or Complexity of Data Reviewed Labs: ordered. Radiology: ordered. ECG/medicine tests: ordered.  Risk Prescription drug management. Decision regarding hospitalization.   This patient presents to the ED for flank pain, this involves an extensive number of treatment options, and is a complaint that carries with a high risk of complications and morbidity.  The differential diagnosis includes cystitis, pyelonephritis, STI, intra-abdominal infection, postop complication, sepsis, infectious etiology.  This is not an exhaustive list.  Lab tests: I ordered and personally interpreted labs.  The pertinent results include: WBC 18.7. Hbg 11.5. Platelets unremarkable.  Potassium 3.4. BUN, creatinine unremarkable. UA significant for Hgb, leukocytes, bacteria.  Imaging studies: I ordered imaging studies. I personally reviewed, interpreted imaging and agree with the radiologist's interpretations. The results include: CT abdomen pelvis showed development of 2 areas of focal right renal hypoenhancement most consistent with focal pyelonephritis/lobar nephronia. No drainable abscess.  Problem list/ ED course/ Critical interventions/ Medical management: HPI: See above Vital signs within normal range and stable throughout visit. Laboratory/imaging studies significant for: See above. On physical examination, patient is afebrile and ill appearing. Patient presents with flank pain likely secondary to renal colic from likely non-obstructed non infected kidney stone. Given history,  exam, and work up I have low suspicion for atypical appendicitis, acute cholecystitis, AAA, or other emergent intraabdominal pathology.  Based on patient clinical presentation, labs/imaging I do think patient has sepsis secondary to pyelonephritis.  Dilaudid, morphine ordered for pain.  Zofran ordered for nausea. Antibiotic treatment initiated with cefepime.  2 L of normal saline ordered.  I do think patient require admission for further evaluation and management.  I have reviewed the patient home medicines and have made adjustments as  needed.  Cardiac monitoring/EKG: The patient was maintained on a cardiac monitor.  I personally reviewed and interpreted the cardiac monitor which showed an underlying rhythm of: sinus rhythm.  Additional history obtained: External records from outside source obtained and reviewed including: Chart review including previous notes, labs, imaging.  Consultations obtained: I requested consultation with Dr. Ronaldo Miyamoto, and discussed lab and imaging findings as well as pertinent plan.  He recommended admission.  Disposition Patient is admitted to the hospital. This chart was dictated using voice recognition software.  Despite best efforts to proofread,  errors can occur which can change the documentation meaning.          Final Clinical Impression(s) / ED Diagnoses Final diagnoses:  Pyelonephritis    Rx / DC Orders ED Discharge Orders     None         Jeanelle Malling, [place] 06/24/22 1949    Gloris Manchester, MD 06/26/22 [phone]

## 2022-06-24 NOTE — Progress Notes (Signed)
Pharmacy Antibiotic Note  Heather Fox is a 27 y.o. female admitted on 06/24/2022 with nausea, vomiting. Patient had ureteral stent placed 1/30. Developed flank pain, urology team recommended that she pull her stent which she did but did not help her symptoms. Pharmacy has been consulted for cefepime dosing for pyelonephritis.  Plan: -Cefepime 2 g IV q8h -Continue to follow renal function, cultures and clinical progress for dose adjustments and de-escalation as indicated    Temp (24hrs), Avg:99 F (37.2 C), Min:98.9 F (37.2 C), Max:99 F (37.2 C)  Recent Labs  Lab 06/24/22 1005  WBC 18.7*  CREATININE 0.97  LATICACIDVEN 1.5    Estimated Creatinine Clearance: 84.5 mL/min (by C-G formula based on SCr of 0.97 mg/dL).    Allergies  Allergen Reactions   Aleve [Naproxen] Other (See Comments)    Swollen Throat    Antimicrobials this admission: Cefepime 2/3 >> Ceftriaxone 2/3 x 1  Dose adjustments this admission: NA  Microbiology results: 2/3 BCx: pending 2/3 UCx: needs to be collected    Thank you for allowing pharmacy to be a part of this patient's care.  Tawnya Crook, PharmD, BCPS Clinical Pharmacist 06/24/2022 4:19 PM

## 2022-06-24 NOTE — ED Notes (Signed)
.  ed

## 2022-06-24 NOTE — Progress Notes (Signed)
PHARMACY -  BRIEF ANTIBIOTIC NOTE   Pharmacy has received consult(s) for cefepime from an ED provider.  The patient's profile has been reviewed for ht/wt/allergies/indication/available labs.    Pt received ceftriaxone 2 g then escalated to cefepime  One time order(s) placed for cefepime 2 g  Further antibiotics/pharmacy consults should be ordered by admitting physician if indicated.                       Thank you,  Tawnya Crook, PharmD, BCPS Clinical Pharmacist 06/24/2022 2:33 PM

## 2022-06-24 NOTE — Sepsis Progress Note (Signed)
Verified that antibiotics were started prior to blood cultures drawn.

## 2022-06-24 NOTE — ED Notes (Signed)
ED TO INPATIENT HANDOFF REPORT  ED Nurse Name and Phone #: Adenike Shidler, EMT-P  S Name/Age/Gender Emri Mat Carne 27 y.o. female Room/Bed: WA23/WA23  Code Status   Code Status: Full Code  Home/SNF/Other  Patient oriented to: self, place, time, and situation Is this baseline? Yes   Triage Complete: Triage complete  Chief Complaint Acute pyelonephritis [N10]  Triage Note Patient had a right lithotripsy Tuesday. A stent was placed and she took it out yesterday. She said she cannot stop vomiting, chills, fever (she cannot remember what is was at home). Right flank pain.    Allergies Allergies  Allergen Reactions   Aleve [Naproxen] Other (See Comments)    Swollen Throat    Level of Care/Admitting Diagnosis ED Disposition     ED Disposition  Admit   Condition  --   Comment  Hospital Area: [facility] [[id number]]  Level of Care: Telemetry [5]  Admit to tele based on following criteria: Monitor for Ischemic changes  May admit patient to [facility] or [facility] if equivalent level of care is available:: No  Covid Evaluation: Asymptomatic - no recent exposure (last 10 days) testing not required  Diagnosis: Acute pyelonephritis [536644]  Admitting Physician: Jonnie Finner [[id number]]  Attending Physician: Jonnie Finner [[id number]]  Certification:: I certify this patient will need inpatient services for at least 2 midnights  Estimated Length of Stay: 2          B Medical/Surgery History Past Medical History:  Diagnosis Date   Anemia    GERD (gastroesophageal reflux disease)    History of kidney stones    Migraine    Past Surgical History:  Procedure Laterality Date   CYSTOSCOPY WITH RETROGRADE PYELOGRAM, URETEROSCOPY AND STENT PLACEMENT Right 05/30/2022   Procedure: CYSTOSCOPY WITH RETROGRADE PYELOGRAM, URETEROSCOPY, HOLMIUM LASER AND STENT PLACEMENT;  Surgeon: Robley Fries, MD;  Location: [facility];  Service: Urology;  Laterality: Right;    CYSTOSCOPY WITH RETROGRADE PYELOGRAM, URETEROSCOPY AND STENT PLACEMENT Right 06/20/2022   Procedure: CYSTOSCOPY WITH RETROGRADE PYELOGRAM, URETEROSCOPY AND STENT PLACEMENT;  Surgeon: Robley Fries, MD;  Location: [facility];  Service: Urology;  Laterality: Right;  1 HR   NO PAST SURGERIES     WISDOM TOOTH EXTRACTION       A IV Location/Drains/Wounds Patient Lines/Drains/Airways Status     Active Line/Drains/Airways     Name Placement date Placement time Site Days   Peripheral IV 06/24/22 20 G 1" Left Antecubital 06/24/22  1008  Antecubital  less than 1   Ureteral Drain/Stent Right ureter 6 Fr. 06/20/22  1458  Right ureter  4            Intake/Output Last 24 hours No intake or output data in the 24 hours ending 06/24/22 1805  Labs/Imaging Results for orders placed or performed during the hospital encounter of 06/24/22 (from the past 48 hour(s))  CBC with Differential     Status: Abnormal   Collection Time: 06/24/22 10:05 AM  Result Value Ref Range   WBC 18.7 (H) 4.0 - 10.5 K/uL   RBC 4.51 3.87 - 5.11 MIL/uL   Hemoglobin 11.5 (L) 12.0 - 15.0 g/dL   HCT 36.9 36.0 - 46.0 %   MCV 81.8 80.0 - 100.0 fL   MCH 25.5 (L) 26.0 - 34.0 pg   MCHC 31.2 30.0 - 36.0 g/dL   RDW 15.5 11.5 - 15.5 %   Platelets 205 150 - 400 K/uL   nRBC 0.0 0.0 -  0.2 %   Neutrophils Relative % 79 %   Neutro Abs 14.7 (H) 1.7 - 7.7 K/uL   Lymphocytes Relative 10 %   Lymphs Abs 1.9 0.7 - 4.0 K/uL   Monocytes Relative 11 %   Monocytes Absolute 2.0 (H) 0.1 - 1.0 K/uL   Eosinophils Relative 0 %   Eosinophils Absolute 0.0 0.0 - 0.5 K/uL   Basophils Relative 0 %   Basophils Absolute 0.0 0.0 - 0.1 K/uL   Immature Granulocytes 0 %   Abs Immature Granulocytes 0.08 (H) 0.00 - 0.07 K/uL    Comment: Performed at [facility], [place] [address] [place], [place] [address]  Comprehensive metabolic panel     Status: Abnormal   Collection Time: 06/24/22 10:05 AM  Result Value Ref Range   Sodium 136  135 - 145 mmol/L   Potassium 3.4 (L) 3.5 - 5.1 mmol/L   Chloride 96 (L) 98 - 111 mmol/L   CO2 26 22 - 32 mmol/L   Glucose, Bld 99 70 - 99 mg/dL    Comment: Glucose reference range applies only to samples taken after fasting for at least 8 hours.   BUN 16 6 - 20 mg/dL   Creatinine, Ser 0.97 0.44 - 1.00 mg/dL   Calcium 9.5 8.9 - 10.3 mg/dL   Total Protein 8.4 (H) 6.5 - 8.1 g/dL   Albumin 4.0 3.5 - 5.0 g/dL   AST 22 15 - 41 U/L   ALT 19 0 - 44 U/L   Alkaline Phosphatase 69 38 - 126 U/L   Total Bilirubin 0.5 0.3 - 1.2 mg/dL   GFR, Estimated >60 >60 mL/min    Comment: (NOTE) Calculated using the CKD-EPI Creatinine Equation (2021)    Anion gap 14 5 - 15    Comment: Performed at [facility], [place] [address], [place], [place] [address]  Lipase, blood     Status: None   Collection Time: 06/24/22 10:05 AM  Result Value Ref Range   Lipase 39 11 - 51 U/L    Comment: Performed at [facility], [place] [address] [place], [place] [address]  Lactic acid, plasma     Status: None   Collection Time: 06/24/22 10:05 AM  Result Value Ref Range   Lactic Acid, Venous 1.5 0.5 - 1.9 mmol/L    Comment: Performed at [facility], [place] [address], [place], [place] [address]  I-Stat Beta hCG blood, ED ([facility], [facility], [facility] only)     Status: Abnormal   Collection Time: 06/24/22 10:23 AM  Result Value Ref Range   I-stat hCG, quantitative 9.4 (H) <5 mIU/mL   Comment 3            Comment:   GEST. AGE      CONC.  (mIU/mL)   <=1 WEEK        5 - 50     2 WEEKS       50 - 500     3 WEEKS       100 - 10,000     4 WEEKS     1,000 - 30,000        FEMALE AND NON-PREGNANT FEMALE:     LESS THAN 5 mIU/mL   Urinalysis, w/ Reflex to Culture (Infection Suspected) -Urine, Clean Catch     Status: Abnormal   Collection Time: 06/24/22 11:35 AM  Result Value Ref Range   Specimen Source URINE, CLEAN CATCH    Color, Urine YELLOW  YELLOW   APPearance CLOUDY (A) CLEAR   Specific  Gravity, Urine 1.025 1.005 - 1.030   pH 5.0 5.0 - 8.0   Glucose, UA NEGATIVE NEGATIVE mg/dL   Hgb urine dipstick SMALL (A) NEGATIVE   Bilirubin Urine NEGATIVE NEGATIVE   Ketones, ur 20 (A) NEGATIVE mg/dL   Protein, ur 528 (A) NEGATIVE mg/dL   Nitrite NEGATIVE NEGATIVE   Leukocytes,Ua SMALL (A) NEGATIVE   RBC / HPF 11-20 0 - 5 RBC/hpf   WBC, UA 21-50 0 - 5 WBC/hpf    Comment:        Reflex urine culture not performed if WBC <=10, OR if Squamous epithelial cells >5. If Squamous epithelial cells >5 suggest recollection.    Bacteria, UA RARE (A) NONE SEEN   Squamous Epithelial / HPF 21-50 0 - 5 /HPF   Mucus PRESENT     Comment: Performed at [facility], [address], [place], [place] [address]  Pregnancy, urine     Status: None   Collection Time: 06/24/22 11:35 AM  Result Value Ref Range   Preg Test, Ur NEGATIVE NEGATIVE    Comment:        THE SENSITIVITY OF THIS METHODOLOGY IS >20 mIU/mL. Performed at [facility], [address] [place], [place] [address]   Resp panel by RT-PCR (RSV, Flu A&B, Covid) Anterior Nasal Swab     Status: None   Collection Time: 06/24/22  2:08 PM   Specimen: Anterior Nasal Swab  Result Value Ref Range   SARS Coronavirus 2 by RT PCR NEGATIVE NEGATIVE    Comment: (NOTE) SARS-CoV-2 target nucleic acids are NOT DETECTED.  The SARS-CoV-2 RNA is generally detectable in upper respiratory specimens during the acute phase of infection. The lowest concentration of SARS-CoV-2 viral copies this assay can detect is 138 copies/mL. A negative result does not preclude SARS-Cov-2 infection and should not be used as the sole basis for treatment or other patient management decisions. A negative result may occur with  improper specimen collection/handling, submission of specimen other than nasopharyngeal swab, presence of viral mutation(s) within the areas targeted by this assay, and inadequate number of viral copies(<138  copies/mL). A negative result must be combined with clinical observations, patient history, and epidemiological information. The expected result is Negative.  Fact Sheet for Patients:  BloggerCourse.com  Fact Sheet for Healthcare Providers:  SeriousBroker.it  This test is no t yet approved or cleared by the [place] FDA and  has been authorized for detection and/or diagnosis of SARS-CoV-2 by FDA under an Emergency Use Authorization (EUA). This EUA will remain  in effect (meaning this test can be used) for the duration of the COVID-19 declaration under Section 564(b)(1) of the Act, 21 U.S.C.section 360bbb-3(b)(1), unless the authorization is terminated  or revoked sooner.       Influenza A by PCR NEGATIVE NEGATIVE   Influenza B by PCR NEGATIVE NEGATIVE    Comment: (NOTE) The Xpert Xpress SARS-CoV-2/FLU/RSV plus assay is intended as an aid in the diagnosis of influenza from Nasopharyngeal swab specimens and should not be used as a sole basis for treatment. Nasal washings and aspirates are unacceptable for Xpert Xpress SARS-CoV-2/FLU/RSV testing.  Fact Sheet for Patients: BloggerCourse.com  Fact Sheet for Healthcare Providers: SeriousBroker.it  This test is not yet approved or cleared by the [place] FDA and has been authorized for detection and/or diagnosis of SARS-CoV-2 by FDA under an Emergency Use Authorization (EUA). This EUA will remain in effect (  meaning this test can be used) for the duration of the COVID-19 declaration under Section 564(b)(1) of the Act, 21 U.S.C. section 360bbb-3(b)(1), unless the authorization is terminated or revoked.     Resp Syncytial Virus by PCR NEGATIVE NEGATIVE    Comment: (NOTE) Fact Sheet for Patients: BloggerCourse.com  Fact Sheet for Healthcare  Providers: SeriousBroker.it  This test is not yet approved or cleared by the [place] FDA and has been authorized for detection and/or diagnosis of SARS-CoV-2 by FDA under an Emergency Use Authorization (EUA). This EUA will remain in effect (meaning this test can be used) for the duration of the COVID-19 declaration under Section 564(b)(1) of the Act, 21 U.S.C. section 360bbb-3(b)(1), unless the authorization is terminated or revoked.  Performed at [facility], [address] [place], [place] [address]   Protime-INR     Status: None   Collection Time: 06/24/22  2:34 PM  Result Value Ref Range   Prothrombin Time 15.2 11.4 - 15.2 seconds   INR 1.2 0.8 - 1.2    Comment: (NOTE) INR goal varies based on device and disease states. Performed at [facility], [address], [place], [place] [address]   APTT     Status: None   Collection Time: 06/24/22  2:34 PM  Result Value Ref Range   aPTT 25 24 - 36 seconds    Comment: Performed at [facility], [address] [place], [place] [address]  Lactic acid, plasma     Status: None   Collection Time: 06/24/22  4:08 PM  Result Value Ref Range   Lactic Acid, Venous 1.1 0.5 - 1.9 mmol/L    Comment: Performed at [facility], [address], [place], [place] [address]   DG Chest Port 1 View  Result Date: 06/24/2022 CLINICAL DATA:  Possible sepsis.  Chills and fever. EXAM: PORTABLE CHEST 1 VIEW COMPARISON:  05/22/2020 FINDINGS: Nipple piercings. Numerous leads and wires project over the chest. Patient rotated left. Midline trachea.  Normal heart size and mediastinal contours. Sharp costophrenic angles.  No pneumothorax.  Clear lungs. Mild right hemidiaphragm elevation. IMPRESSION: No active disease. Electronically Signed   By: Jeronimo Greaves M.D.   On: 06/24/2022 14:25   CT ABDOMEN PELVIS W CONTRAST  Result Date: 06/24/2022 CLINICAL DATA:   Abdominal pain. Right flank pain. Stone removed 05/30/2022. EXAM: CT ABDOMEN AND PELVIS WITH CONTRAST TECHNIQUE: Multidetector CT imaging of the abdomen and pelvis was performed using the standard protocol following bolus administration of intravenous contrast. RADIATION DOSE REDUCTION: This exam was performed according to the departmental dose-optimization program which includes automated exposure control, adjustment of the mA and/or kV according to patient size and/or use of iterative reconstruction technique. CONTRAST:  OMNIPAQUE IOHEXOL 300 MG/ML  SOLN COMPARISON:  05/27/2022 FINDINGS: Lower chest: Clear lung bases. Normal heart size without pericardial or pleural effusion. Hepatobiliary: Normal liver. Normal gallbladder, without biliary ductal dilatation. Pancreas: Normal, without mass or ductal dilatation. Spleen: Normal in size, without focal abnormality. Adrenals/Urinary Tract: Normal adrenal glands. Normal left kidney. The previously described right-sided hydroureteronephrosis has resolved. Interval development of areas of focal, rounded hypoattenuation within the right kidney including within the upper pole at 2.1 cm in 14/2 and interpolar region at 2.2 cm on 18/2. The previously described right pelvic calcification is no longer identified and has presumably been removed. There is a more posterior vascular calcification in the right hemipelvis on 63/2. Decompressed urinary bladder. Stomach/Bowel: Normal stomach, without wall  thickening. Colonic stool burden suggests constipation. Normal terminal ileum. Appendix likely identified and normal. Normal small bowel. Vascular/Lymphatic: Normal caliber of the aorta and branch vessels. No abdominopelvic adenopathy. Reproductive: Normal uterus and adnexa. Other: No significant free fluid.  No free intraperitoneal air. Musculoskeletal: No acute osseous abnormality. IMPRESSION: 1. Development of 2 areas of focal right renal hypoenhancement most consistent with  focal pyelonephritis/lobar nephronia. No drainable abscess. 2. Resolution of previously described right-sided hydronephrosis. 3.  Possible constipation. Electronically Signed   By: Abigail Miyamoto M.D.   On: 06/24/2022 12:17    Pending Labs Unresulted Labs (From admission, onward)     Start     Ordered   06/24/22 1608  Magnesium  Add-on,   AD        06/24/22 1607   06/24/22 1337  Urinalysis, w/ Reflex to Culture (Infection Suspected) -Urine, Clean Catch  (Septic presentation on arrival (screening labs, nursing and treatment orders for obvious sepsis))  Once,   URGENT       Question:  Specimen Source  Answer:  Urine, Clean Catch   06/24/22 1337   06/24/22 1332  Blood Culture (routine x 2)  (Septic presentation on arrival (screening labs, nursing and treatment orders for obvious sepsis))  BLOOD CULTURE X 2,   STAT      06/24/22 1337   Signed and Held  [employer] morning,   R        Signed and Held   Signed and Held  Cortisol-am, blood  Tomorrow morning,   R        Signed and Held   Signed and Held  Procalcitonin  Tomorrow morning,   R        Signed and Held   Signed and Held  Comprehensive metabolic panel  Tomorrow morning,   R        Signed and Held   Signed and Held  CBC  Tomorrow morning,   R        Signed and Held   Signed and Held  Iron and TIBC  Once,   R        Signed and Held            Vitals/Pain Today's Vitals   06/24/22 1500 06/24/22 1600 06/24/22 1700 06/24/22 1737  BP: 110/73 104/72    Pulse: 93 98    Resp: 19 19    Temp:    98.9 F (37.2 C)  TempSrc:      SpO2: 99% 98%    PainSc:   3      Isolation Precautions No active isolations  Medications Medications  prochlorperazine (COMPAZINE) injection 10 mg (has no administration in time range)  fentaNYL (SUBLIMAZE) injection 25 mcg (has no administration in time range)  0.9 %  sodium chloride infusion (has no administration in time range)  ceFEPIme (MAXIPIME) 2 g in sodium chloride 0.9 % 100 mL IVPB  (has no administration in time range)  ondansetron (ZOFRAN) injection 4 mg (4 mg Intravenous Given 06/24/22 1010)  HYDROmorphone (DILAUDID) injection 1 mg (1 mg Intravenous Given 06/24/22 1012)  sodium chloride 0.9 % bolus 1,000 mL (0 mLs Intravenous Stopped 06/24/22 1130)  fentaNYL (SUBLIMAZE) injection 50 mcg (50 mcg Intravenous Given 06/24/22 1108)  iohexol (OMNIPAQUE) 300 MG/ML solution 100 mL (100 mLs Intravenous Contrast Given 06/24/22 1151)  sodium chloride 0.9 % bolus 1,000 mL (1,000 mLs Intravenous New Bag/Given 06/24/22 1429)  ceFEPIme (MAXIPIME) 2 g in sodium chloride 0.9 % 100 mL  IVPB (0 g Intravenous Stopped 06/24/22 1700)  fentaNYL (SUBLIMAZE) injection 50 mcg (50 mcg Intravenous Given 06/24/22 1555)    Mobility walks with person assist     Focused Assessments     R Recommendations: See Admitting Provider Note  Report given to:   Additional Notes: Pt only has one IV.  Pt had to have ultrasound IV.

## 2022-06-24 NOTE — Sepsis Progress Note (Signed)
Sepsis protocol is being followed by [facility]. 

## 2022-06-25 DIAGNOSIS — D649 Anemia, unspecified: Secondary | ICD-10-CM

## 2022-06-25 DIAGNOSIS — E876 Hypokalemia: Secondary | ICD-10-CM

## 2022-06-25 DIAGNOSIS — D509 Iron deficiency anemia, unspecified: Secondary | ICD-10-CM

## 2022-06-25 LAB — COMPREHENSIVE METABOLIC PANEL
ALT: 15 U/L (ref 0–44)
AST: 15 U/L (ref 15–41)
Albumin: 2.8 g/dL — ABNORMAL LOW (ref 3.5–5.0)
Alkaline Phosphatase: 41 U/L (ref 38–126)
Anion gap: 8 (ref 5–15)
BUN: 8 mg/dL (ref 6–20)
CO2: 22 mmol/L (ref 22–32)
Calcium: 8 mg/dL — ABNORMAL LOW (ref 8.9–10.3)
Chloride: 106 mmol/L (ref 98–111)
Creatinine, Ser: 0.62 mg/dL (ref 0.44–1.00)
GFR, Estimated: >60 mL/min (ref >60)
Glucose, Bld: 88 mg/dL (ref 70–99)
Potassium: 3.9 mmol/L (ref 3.5–5.1)
Sodium: 136 mmol/L (ref 135–145)
Total Bilirubin: 0.4 mg/dL (ref 0.3–1.2)
Total Protein: 5.6 g/dL — ABNORMAL LOW (ref 6.5–8.1)

## 2022-06-25 LAB — CBC
HCT: 27.7 % — ABNORMAL LOW (ref 36.0–46.0)
Hemoglobin: 8.4 g/dL — ABNORMAL LOW (ref 12.0–15.0)
MCH: 25.5 pg — ABNORMAL LOW (ref 26.0–34.0)
MCHC: 30.3 g/dL (ref 30.0–36.0)
MCV: 83.9 fL (ref 80.0–100.0)
Platelets: 182 10*3/uL (ref 150–400)
RBC: 3.3 MIL/uL — ABNORMAL LOW (ref 3.87–5.11)
RDW: 15.6 % — ABNORMAL HIGH (ref 11.5–15.5)
WBC: 11.6 10*3/uL — ABNORMAL HIGH (ref 4.0–10.5)
nRBC: 0 % (ref 0.0–0.2)

## 2022-06-25 LAB — PROTIME-INR
INR: 1.2 (ref 0.8–1.2)
Prothrombin Time: 14.9 seconds (ref 11.4–15.2)

## 2022-06-25 LAB — CORTISOL-AM, BLOOD: Cortisol - AM: 8.1 ug/dL (ref 6.7–22.6)

## 2022-06-25 LAB — PROCALCITONIN: Procalcitonin: 0.15 ng/mL

## 2022-06-25 MED ORDER — MAGNESIUM SULFATE 2 GM/50ML IV SOLN
2.0000 g | Freq: Once | INTRAVENOUS | Status: AC
  Administered 2022-06-25: 2 g via INTRAVENOUS
  Filled 2022-06-25: qty 50

## 2022-06-25 MED ORDER — SENNOSIDES-DOCUSATE SODIUM 8.6-50 MG PO TABS
1.0000 | ORAL_TABLET | Freq: Two times a day (BID) | ORAL | Status: DC
  Administered 2022-06-25: 1 via ORAL
  Filled 2022-06-25 (×2): qty 1

## 2022-06-25 MED ORDER — POLYETHYLENE GLYCOL 3350 17 G PO PACK: 17.0000 g | PACK | Freq: Every day | ORAL | Status: DC

## 2022-06-25 MED ORDER — MORPHINE SULFATE (PF) 2 MG/ML IV SOLN
2.0000 mg | INTRAVENOUS | Status: DC | PRN
  Administered 2022-06-26: 2 mg via INTRAVENOUS
  Filled 2022-06-25: qty 1

## 2022-06-25 NOTE — Progress Notes (Signed)
PROGRESS NOTE    Heather Fox  DVV:[id number] DOB: 10/24/1995 DOA: 06/24/2022 PCP: [facility]    Chief Complaint  Patient presents with   Emesis    Brief Narrative:  HPI per Dr. Marylyn Ishihara  Heather Fox is a 27 y.o. female with medical history significant of hydronephrosis. Presenting with right flank pain, N/V. She reports that she had a ureteral stent placed on 06/20/22. After going home, she started having N/V. She was unable to keep anything down. So notice over the next 2 days she was developing fevers. Her N/V persisted. She developed flank pain. She spoke with her urology team and they recommended that she pull her stent. So she did, but it did not help her symptoms. When her symptom did not improve this morning, she decided to come to the ED for evaluation. She denies any other aggravating or alleviating factors.     Patient seen in the ED, CT abdomen and pelvis, workup done concerning for acute pyelonephritis.  Patient placed empirically on IV antibiotics.  Assessment & Plan:   Principal Problem:   Acute pyelonephritis Active Problems:   SIRS (systemic inflammatory response syndrome) (HCC)   Hypokalemia   Normocytic anemia   Iron deficiency anemia  #1 acute pyelonephritis -Patient presented with nausea, vomiting, right flank pain after undergoing ureteral stent placement 06/20/2022. -Patient contacted urologist, who recommended patient pull his stent which she did. -Patient noted on presentation to have a significant leukocytosis with soft/borderline blood pressure. -CT abdomen and pelvis done with development of 2 areas of focal right renal hypoenhancement most consistent with focal pyelonephritis/lobar nephronia, no drainable abscess noted.  Resolution of previously described right-sided hydronephrosis.  Possible constipation. -Leukocytosis trending down. -Check urine cultures. -Patient improving clinically. -Continue IV cefepime, IV  fluids, IV pain medications, IV antiemetics, supportive care.  2.  Hypokalemia -Magnesium noted at 1.7. -Magnesium sulfate 2 g IV x 1. -Potassium repleted currently at 3.9.  3.  Iron deficiency anemia -Patient with no overt bleeding. -Anemia panel consistent with iron deficiency anemia and a menstruating female. -Hemoglobin currently at 8.4 from 11.5 on admission. -Follow H&H. -Transfuse for hemoglobin < 7.    DVT prophylaxis: SCDs Code Status: Full Family Communication: Updated patient.  No family at bedside. Disposition: Home when clinically improved.  Status is: Inpatient Remains inpatient appropriate because: Severity of illness   Consultants:  None  Procedures:  CT abdomen and pelvis 06/24/2022 Chest x-ray 06/24/2022   Antimicrobials: IV cefepime 06/24/2022>>>>>   Subjective: Patient laying in bed.  Denies any chest pain or shortness of breath.  Still with complaints of right flank pain which she feels is improving since admission.  Cannot taking deep breaths due to right flank pain.  Denies any dysuria.  Nausea and vomiting improving.  Able to tolerate oral intake this morning.  Objective: Vitals:   06/24/22 2304 06/25/22 0309 06/25/22 0744 06/25/22 1200  BP: 107/69 106/66 114/74 (!) 101/59  Pulse: 95 90 84 89  Resp: 18 18 18 20   Temp: 99.6 F (37.6 C) 99.3 F (37.4 C)  98.5 F (36.9 C)  TempSrc: Oral Oral Oral Oral  SpO2: 100% 100% 100% 100%  Weight:      Height:        Intake/Output Summary (Last 24 hours) at 06/25/2022 1639 Last data filed at 06/25/2022 1300 Gross per 24 hour  Intake 1400 ml  Output --  Net 1400 ml   Filed Weights   06/24/22 1904  Weight:  70 kg    Examination:  General exam: Appears calm and comfortable  Respiratory system: Clear to auscultation. Respiratory effort normal. Cardiovascular system: S1 & S2 heard, RRR. No JVD, murmurs, rubs, gallops or clicks. No pedal edema. Gastrointestinal system: Abdomen is nondistended, soft and  right CVA TTP, positive bowel sounds.  No rebound.  No guarding.  Central nervous system: Alert and oriented. No focal neurological deficits. Extremities: Symmetric 5 x 5 power. Skin: No rashes, lesions or ulcers Psychiatry: Judgement and insight appear normal. Mood & affect appropriate.     Data Reviewed: I have personally reviewed following labs and imaging studies  CBC: Recent Labs  Lab 06/24/22 1005 06/25/22 0556  WBC 18.7* 11.6*  NEUTROABS 14.7*  --   HGB 11.5* 8.4*  HCT 36.9 27.7*  MCV 81.8 83.9  PLT 205 384    Basic Metabolic Panel: Recent Labs  Lab 06/24/22 1005 06/24/22 2008 06/25/22 0556  NA 136  --  136  K 3.4*  --  3.9  CL 96*  --  106  CO2 26  --  22  GLUCOSE 99  --  88  BUN 16  --  8  CREATININE 0.97  --  0.62  CALCIUM 9.5  --  8.0*  MG  --  1.7  --     GFR: Estimated Creatinine Clearance: 102.8 mL/min (by C-G formula based on SCr of 0.62 mg/dL).  Liver Function Tests: Recent Labs  Lab 06/24/22 1005 06/25/22 0556  AST 22 15  ALT 19 15  ALKPHOS 69 41  BILITOT 0.5 0.4  PROT 8.4* 5.6*  ALBUMIN 4.0 2.8*    CBG: No results for input(s): "GLUCAP" in the last 168 hours.   Recent Results (from the past 240 hour(s))  Resp panel by RT-PCR (RSV, Flu A&B, Covid) Anterior Nasal Swab     Status: None   Collection Time: 06/24/22  2:08 PM   Specimen: Anterior Nasal Swab  Result Value Ref Range Status   SARS Coronavirus 2 by RT PCR NEGATIVE NEGATIVE Final    Comment: (NOTE) SARS-CoV-2 target nucleic acids are NOT DETECTED.  The SARS-CoV-2 RNA is generally detectable in upper respiratory specimens during the acute phase of infection. The lowest concentration of SARS-CoV-2 viral copies this assay can detect is 138 copies/mL. A negative result does not preclude SARS-Cov-2 infection and should not be used as the sole basis for treatment or other patient management decisions. A negative result may occur with  improper specimen collection/handling,  submission of specimen other than nasopharyngeal swab, presence of viral mutation(s) within the areas targeted by this assay, and inadequate number of viral copies(<138 copies/mL). A negative result must be combined with clinical observations, patient history, and epidemiological information. The expected result is Negative.  Fact Sheet for Patients:  EntrepreneurPulse.com.au  Fact Sheet for Healthcare Providers:  IncredibleEmployment.be  This test is no t yet approved or cleared by the [place] FDA and  has been authorized for detection and/or diagnosis of SARS-CoV-2 by FDA under an Emergency Use Authorization (EUA). This EUA will remain  in effect (meaning this test can be used) for the duration of the COVID-19 declaration under Section 564(b)(1) of the Act, 21 U.S.C.section 360bbb-3(b)(1), unless the authorization is terminated  or revoked sooner.       Influenza A by PCR NEGATIVE NEGATIVE Final   Influenza B by PCR NEGATIVE NEGATIVE Final    Comment: (NOTE) The Xpert Xpress SARS-CoV-2/FLU/RSV plus assay is intended as an aid in the diagnosis  of influenza from Nasopharyngeal swab specimens and should not be used as a sole basis for treatment. Nasal washings and aspirates are unacceptable for Xpert Xpress SARS-CoV-2/FLU/RSV testing.  Fact Sheet for Patients: EntrepreneurPulse.com.au  Fact Sheet for Healthcare Providers: IncredibleEmployment.be  This test is not yet approved or cleared by the [place] FDA and has been authorized for detection and/or diagnosis of SARS-CoV-2 by FDA under an Emergency Use Authorization (EUA). This EUA will remain in effect (meaning this test can be used) for the duration of the COVID-19 declaration under Section 564(b)(1) of the Act, 21 U.S.C. section 360bbb-3(b)(1), unless the authorization is terminated or revoked.     Resp Syncytial Virus by PCR NEGATIVE  NEGATIVE Final    Comment: (NOTE) Fact Sheet for Patients: EntrepreneurPulse.com.au  Fact Sheet for Healthcare Providers: IncredibleEmployment.be  This test is not yet approved or cleared by the [place] FDA and has been authorized for detection and/or diagnosis of SARS-CoV-2 by FDA under an Emergency Use Authorization (EUA). This EUA will remain in effect (meaning this test can be used) for the duration of the COVID-19 declaration under Section 564(b)(1) of the Act, 21 U.S.C. section 360bbb-3(b)(1), unless the authorization is terminated or revoked.  Performed at [facility], [place] [address], [place], [place] [address]   Blood Culture (routine x 2)     Status: None (Preliminary result)   Collection Time: 06/24/22  2:35 PM   Specimen: Site Not Specified; Blood  Result Value Ref Range Status   Specimen Description   Final    SITE NOT SPECIFIED BOTTLES DRAWN AEROBIC AND ANAEROBIC Performed at [facility] [address] [place], [place] [address]    Special Requests   Final    Blood Culture results may not be optimal due to an excessive volume of blood received in culture bottles Performed at [facility] [address] [place], [place] [address]    Culture   Final    NO GROWTH < 24 HOURS Performed at [facility] [address] [place], [place] [address]    Report Status PENDING  Incomplete  Blood Culture (routine x 2)     Status: None (Preliminary result)   Collection Time: 06/24/22  8:08 PM   Specimen: BLOOD  Result Value Ref Range Status   Specimen Description   Final    BLOOD BLOOD RIGHT ARM Performed at [facility] [address], [place], [place] [address]    Special Requests   Final    BOTTLES DRAWN AEROBIC ONLY Blood Culture results may not be optimal due to an inadequate volume of blood received in culture bottles Performed at [facility] [address] [place], [place] [address]    Culture   Final    NO GROWTH < 12 HOURS Performed at [facility] [address], [place], [place] [address]    Report Status PENDING  Incomplete         Radiology Studies: DG Chest Port 1 View  Result Date: 06/24/2022 CLINICAL DATA:  Possible sepsis.  Chills and fever. EXAM: PORTABLE CHEST 1 VIEW COMPARISON:  05/22/2020 FINDINGS: Nipple piercings. Numerous leads and wires project over the chest. Patient rotated left. Midline trachea.  Normal heart size and mediastinal contours. Sharp costophrenic angles.  No pneumothorax.  Clear lungs. Mild right hemidiaphragm elevation. IMPRESSION: No active disease. Electronically Signed   By: Abigail Miyamoto M.D.   On: 06/24/2022 14:25  CT ABDOMEN PELVIS W CONTRAST  Result Date: 06/24/2022 CLINICAL DATA:  Abdominal pain. Right flank pain. Stone removed 05/30/2022. EXAM: CT ABDOMEN AND PELVIS WITH CONTRAST TECHNIQUE: Multidetector CT imaging of the abdomen and pelvis was performed using the standard protocol following bolus administration of intravenous contrast. RADIATION DOSE REDUCTION: This exam was performed according to the departmental dose-optimization program which includes automated exposure control, adjustment of the mA and/or kV according to patient size and/or use of iterative reconstruction technique. CONTRAST:  OMNIPAQUE IOHEXOL 300 MG/ML  SOLN COMPARISON:  05/27/2022 FINDINGS: Lower chest: Clear lung bases. Normal heart size without pericardial or pleural effusion. Hepatobiliary: Normal liver. Normal gallbladder, without biliary ductal dilatation. Pancreas: Normal, without mass or ductal dilatation. Spleen: Normal in size, without focal abnormality. Adrenals/Urinary Tract: Normal adrenal glands. Normal left kidney. The previously described right-sided hydroureteronephrosis has resolved. Interval development of areas of focal, rounded hypoattenuation within the right  kidney including within the upper pole at 2.1 cm in 14/2 and interpolar region at 2.2 cm on 18/2. The previously described right pelvic calcification is no longer identified and has presumably been removed. There is a more posterior vascular calcification in the right hemipelvis on 63/2. Decompressed urinary bladder. Stomach/Bowel: Normal stomach, without wall thickening. Colonic stool burden suggests constipation. Normal terminal ileum. Appendix likely identified and normal. Normal small bowel. Vascular/Lymphatic: Normal caliber of the aorta and branch vessels. No abdominopelvic adenopathy. Reproductive: Normal uterus and adnexa. Other: No significant free fluid.  No free intraperitoneal air. Musculoskeletal: No acute osseous abnormality. IMPRESSION: 1. Development of 2 areas of focal right renal hypoenhancement most consistent with focal pyelonephritis/lobar nephronia. No drainable abscess. 2. Resolution of previously described right-sided hydronephrosis. 3.  Possible constipation. Electronically Signed   By: Jeronimo Greaves M.D.   On: 06/24/2022 12:17        Scheduled Meds:  polyethylene glycol  17 g Oral Daily   senna-docusate  1 tablet Oral BID   Continuous Infusions:  sodium chloride 125 mL/hr at 06/25/22 1117   ceFEPime (MAXIPIME) IV 2 g (06/25/22 1433)     LOS: 1 day    Time spent: 35 minutes    Ramiro Harvest, MD [facility]   To contact the attending provider between 7A-7P or the covering provider during after hours 7P-7A, please log into the web site [email or web address] and access using universal [place] password for that web site. If you do not have the password, please call the hospital operator.  06/25/2022, 4:39 PM

## 2022-06-26 LAB — CBC WITH DIFFERENTIAL/PLATELET
Abs Immature Granulocytes: 0.04 10*3/uL (ref 0.00–0.07)
Basophils Absolute: 0 10*3/uL (ref 0.0–0.1)
Basophils Relative: 0 %
Eosinophils Absolute: 0 10*3/uL (ref 0.0–0.5)
Eosinophils Relative: 0 %
HCT: 28.7 % — ABNORMAL LOW (ref 36.0–46.0)
Hemoglobin: 8.6 g/dL — ABNORMAL LOW (ref 12.0–15.0)
Immature Granulocytes: 0 %
Lymphocytes Relative: 18 %
Lymphs Abs: 1.7 10*3/uL (ref 0.7–4.0)
MCH: 25.1 pg — ABNORMAL LOW (ref 26.0–34.0)
MCHC: 30 g/dL (ref 30.0–36.0)
MCV: 83.9 fL (ref 80.0–100.0)
Monocytes Absolute: 1.2 10*3/uL — ABNORMAL HIGH (ref 0.1–1.0)
Monocytes Relative: 12 %
Neutro Abs: 6.6 10*3/uL (ref 1.7–7.7)
Neutrophils Relative %: 70 %
Platelets: 202 10*3/uL (ref 150–400)
RBC: 3.42 MIL/uL — ABNORMAL LOW (ref 3.87–5.11)
RDW: 15.8 % — ABNORMAL HIGH (ref 11.5–15.5)
WBC: 9.6 10*3/uL (ref 4.0–10.5)
nRBC: 0 % (ref 0.0–0.2)

## 2022-06-26 LAB — MAGNESIUM: Magnesium: 1.9 mg/dL (ref 1.7–2.4)

## 2022-06-26 LAB — BASIC METABOLIC PANEL
Anion gap: 9 (ref 5–15)
BUN: 6 mg/dL (ref 6–20)
CO2: 22 mmol/L (ref 22–32)
Calcium: 8.5 mg/dL — ABNORMAL LOW (ref 8.9–10.3)
Chloride: 106 mmol/L (ref 98–111)
Creatinine, Ser: 0.67 mg/dL (ref 0.44–1.00)
GFR, Estimated: >60 mL/min (ref >60)
Glucose, Bld: 87 mg/dL (ref 70–99)
Potassium: 3.7 mmol/L (ref 3.5–5.1)
Sodium: 137 mmol/L (ref 135–145)

## 2022-06-26 LAB — URINE CULTURE: Culture: NO GROWTH

## 2022-06-26 MED ORDER — POLYETHYLENE GLYCOL 3350 17 G PO PACK: 17.0000 g | PACK | Freq: Every day | ORAL | 0 refills | Status: DC

## 2022-06-26 MED ORDER — SENNOSIDES-DOCUSATE SODIUM 8.6-50 MG PO TABS: 1.0000 | ORAL_TABLET | Freq: Two times a day (BID) | ORAL | Status: DC

## 2022-06-26 MED ORDER — SULFAMETHOXAZOLE-TRIMETHOPRIM 800-160 MG PO TABS: 1.0000 | ORAL_TABLET | Freq: Two times a day (BID) | ORAL | 0 refills | Status: AC

## 2022-06-26 MED ORDER — OXYCODONE HCL 5 MG PO TABS: 5.0000 mg | ORAL_TABLET | ORAL | 0 refills | Status: DC | PRN

## 2022-06-26 NOTE — Progress Notes (Signed)
  Transition of Care (TOC) Screening Note   Patient Details  Name: Heather Fox Date of Birth: 03/27/1996   Transition of Care [facility]) CM/SW Contact:    Vassie Moselle, LCSW Phone Number: 06/26/2022, 12:37 PM    Transition of Care Department [facility]) has reviewed patient and no TOC needs have been identified at this time. We will continue to monitor patient advancement through interdisciplinary progression rounds. If new patient transition needs arise, please place a TOC consult.

## 2022-06-26 NOTE — Discharge Summary (Signed)
Physician Discharge Summary  TANNA LOEFFLER YTK:[id number] DOB: 09-17-1995 DOA: 06/24/2022  PCP: [facility]  Admit date: 06/24/2022 Discharge date: 06/26/2022  Time spent: 55 minutes  Recommendations for Outpatient Follow-up:  Follow-up with Dr. Arita Miss, urology as scheduled.  Acute pyelonephritis will need to be followed up upon. Follow-up with [facility] in 2 weeks.  On follow-up patient's acute pyelonephritis need to be followed up upon.   Discharge Diagnoses:  Principal Problem:   Acute pyelonephritis Active Problems:   SIRS (systemic inflammatory response syndrome) (HCC)   Hypokalemia   Normocytic anemia   Iron deficiency anemia   Discharge Condition: Stable and improved.  Diet recommendation: Regular  Filed Weights   06/24/22 1904  Weight: 70 kg    History of present illness:  HPI per Dr Ronaldo Miyamoto  Heather Fox is a 27 y.o. female with medical history significant of hydronephrosis. Presenting with right flank pain, N/V. She reports that she had a ureteral stent placed on 06/20/22. After going home, she started having N/V. She was unable to keep anything down. So notice over the next 2 days she was developing fevers. Her N/V persisted. She developed flank pain. She spoke with her urology team and they recommended that she pull her stent. So she did, but it did not help her symptoms. When her symptom did not improve this morning, she decided to come to the ED for evaluation. She denies any other aggravating or alleviating factors.  Hospital Course:  #1 acute pyelonephritis -Patient presented with nausea, vomiting, right flank pain after undergoing ureteral stent placement 06/20/2022. -Patient contacted urologist, who recommended patient pull his stent which she did. -Patient noted on presentation to have a significant leukocytosis with soft/borderline blood pressure. -CT abdomen and pelvis done with development of 2  areas of focal right renal hypoenhancement most consistent with focal pyelonephritis/lobar nephronia, no drainable abscess noted.  Resolution of previously described right-sided hydronephrosis.  Possible constipation. -Leukocytosis trended down and had resolved by day of discharge. -Urine cultures obtained with no growth. -Patient improved clinically during the hospitalization maintained on IV cefepime and will be transition to oral Bactrim for 8 more days to complete a 10-day course of antibiotic treatment. -Follow-up with PCP, urology as scheduled.   2.  Hypokalemia -Magnesium noted at 1.7. -Repleted during the hospitalization.   3.  Iron deficiency anemia -Patient with no overt bleeding. -Anemia panel consistent with iron deficiency anemia and a menstruating female. -Hemoglobin trended down however stabilized at 8.6 by day of discharge, likely dilutional effect.   -Outpatient follow-up with PCP.   Procedures: CT abdomen and pelvis 06/24/2022 Chest x-ray 06/24/2022  Consultations: None  Discharge Exam: Vitals:   06/26/22 0427 06/26/22 1314  BP: 111/74 109/66  Pulse: 85 96  Resp: 16 16  Temp: (!) 97.5 F (36.4 C) 97.6 F (36.4 C)  SpO2: 100% 100%    General: NAD Cardiovascular: Regular rate rhythm no murmurs rubs or gallops.  No JVD.  No lower extremity edema. Respiratory: Lungs clear to auscultation bilaterally.  No wheezes, no crackles, no rhonchi.  Fair air movement.  Speaking in full sentences. GI: Soft, nondistended, positive bowel sounds, decreased right CVA TTP.  Discharge Instructions   Discharge Instructions     Diet general   Complete by: As directed    Increase activity slowly   Complete by: As directed       Allergies as of 06/26/2022       Reactions  Aleve [naproxen] Other (See Comments)   Swollen Throat        Medication List     STOP taking these medications    celecoxib 100 MG capsule Commonly known as: CeleBREX   cyclobenzaprine 10 MG  tablet Commonly known as: FLEXERIL   metroNIDAZOLE 0.75 % vaginal gel Commonly known as: METROGEL   norethindrone 5 MG tablet Commonly known as: Aygestin   norethindrone-ethinyl estradiol 0.4-35 MG-MCG tablet Commonly known as: OVCON-35       TAKE these medications    acetaminophen 500 MG tablet Commonly known as: TYLENOL Take 500 mg by mouth every 6 (six) hours as needed for mild pain.   Emgality 120 MG/ML Soaj Generic drug: Galcanezumab-gnlm Inject 1 Pen into the skin every 30 (thirty) days.   Nurtec 75 MG Tbdp Generic drug: Rimegepant Sulfate Take 75 mg by mouth as needed (for migraines). Max dose 1 pill in 24 hours   oxyCODONE 5 MG immediate release tablet Commonly known as: Roxicodone Take 1 tablet (5 mg total) by mouth every 4 (four) hours as needed for moderate pain. What changed:  when to take this reasons to take this   polyethylene glycol 17 g packet Commonly known as: MIRALAX / GLYCOLAX Take 17 g by mouth daily. Start taking on: June 27, 2022   promethazine 25 MG tablet Commonly known as: PHENERGAN Take 25 mg by mouth every 6 (six) hours as needed for nausea or vomiting.   senna-docusate 8.6-50 MG tablet Commonly known as: Senokot-S Take 1 tablet by mouth 2 (two) times daily.   sulfamethoxazole-trimethoprim 800-160 MG tablet Commonly known as: BACTRIM DS Take 1 tablet by mouth 2 (two) times daily for 8 days.       Allergies  Allergen Reactions   Aleve [Naproxen] Other (See Comments)    [place]. Schedule an appointment as soon as possible for a visit in 2 week(s).   Specialty: Pediatrics Contact information: [place] [address] [phone]         Robley Fries, MD Follow up.   Specialty: Urology Why: Follow-up as scheduled. Contact information: [address] [place] [address] [phone]                   The results of significant diagnostics from this hospitalization (including imaging, microbiology, ancillary and laboratory) are listed below for reference.    Significant Diagnostic Studies: DG Chest Port 1 View  Result Date: 06/24/2022 CLINICAL DATA:  Possible sepsis.  Chills and fever. EXAM: PORTABLE CHEST 1 VIEW COMPARISON:  05/22/2020 FINDINGS: Nipple piercings. Numerous leads and wires project over the chest. Patient rotated left. Midline trachea.  Normal heart size and mediastinal contours. Sharp costophrenic angles.  No pneumothorax.  Clear lungs. Mild right hemidiaphragm elevation. IMPRESSION: No active disease. Electronically Signed   By: Abigail Miyamoto M.D.   On: 06/24/2022 14:25   CT ABDOMEN PELVIS W CONTRAST  Result Date: 06/24/2022 CLINICAL DATA:  Abdominal pain. Right flank pain. Stone removed 05/30/2022. EXAM: CT ABDOMEN AND PELVIS WITH CONTRAST TECHNIQUE: Multidetector CT imaging of the abdomen and pelvis was performed using the standard protocol following bolus administration of intravenous contrast. RADIATION DOSE REDUCTION: This exam was performed according to the departmental dose-optimization program which includes automated exposure control, adjustment of the mA and/or kV according to patient size and/or use of iterative reconstruction technique. CONTRAST:  162mL OMNIPAQUE  IOHEXOL 300 MG/ML  SOLN COMPARISON:  05/27/2022 FINDINGS: Lower chest: Clear lung bases. Normal heart size without pericardial or pleural effusion. Hepatobiliary: Normal liver. Normal gallbladder, without biliary ductal dilatation. Pancreas: Normal, without mass or ductal dilatation. Spleen: Normal in size, without focal abnormality. Adrenals/Urinary Tract: Normal adrenal glands. Normal left kidney. The previously described right-sided hydroureteronephrosis has resolved. Interval development of areas of focal, rounded hypoattenuation within the right kidney including within the upper pole at 2.1 cm in 14/2 and  interpolar region at 2.2 cm on 18/2. The previously described right pelvic calcification is no longer identified and has presumably been removed. There is a more posterior vascular calcification in the right hemipelvis on 63/2. Decompressed urinary bladder. Stomach/Bowel: Normal stomach, without wall thickening. Colonic stool burden suggests constipation. Normal terminal ileum. Appendix likely identified and normal. Normal small bowel. Vascular/Lymphatic: Normal caliber of the aorta and branch vessels. No abdominopelvic adenopathy. Reproductive: Normal uterus and adnexa. Other: No significant free fluid.  No free intraperitoneal air. Musculoskeletal: No acute osseous abnormality. IMPRESSION: 1. Development of 2 areas of focal right renal hypoenhancement most consistent with focal pyelonephritis/lobar nephronia. No drainable abscess. 2. Resolution of previously described right-sided hydronephrosis. 3.  Possible constipation. Electronically Signed   By: Jeronimo Greaves M.D.   On: 06/24/2022 12:17   DG C-Arm 1-60 Min-No Report  Result Date: 06/20/2022 Fluoroscopy was utilized by the requesting physician.  No radiographic interpretation.   DG C-Arm 1-60 Min-No Report  Result Date: 05/30/2022 Fluoroscopy was utilized by the requesting physician.  No radiographic interpretation.    Microbiology: Recent Results (from the past 240 hour(s))  Urine Culture (for pregnant, neutropenic or urologic patients or patients with an indwelling urinary catheter)     Status: None   Collection Time: 06/24/22  8:52 AM   Specimen: Urine, Clean Catch  Result Value Ref Range Status   Specimen Description   Final    URINE, CLEAN CATCH Performed at [facility], [address], [place], [place] [address]    Special Requests   Final    NONE Performed at [facility], [address], [place], [place] [address]    Culture   Final    NO GROWTH Performed at [facility], [address] [place], [place] [address]    Report Status 06/26/2022 FINAL  Final  Resp panel by RT-PCR (RSV, Flu A&B, Covid) Anterior Nasal Swab     Status: None   Collection Time: 06/24/22  2:08 PM   Specimen: Anterior Nasal Swab  Result Value Ref Range Status   SARS Coronavirus 2 by RT PCR NEGATIVE NEGATIVE Final    Comment: (NOTE) SARS-CoV-2 target nucleic acids are NOT DETECTED.  The SARS-CoV-2 RNA is generally detectable in upper respiratory specimens during the acute phase of infection. The lowest concentration of SARS-CoV-2 viral copies this assay can detect is 138 copies/mL. A negative result does not preclude SARS-Cov-2 infection and should not be used as the sole basis for treatment or other patient management decisions. A negative result may occur with  improper specimen collection/handling, submission of specimen other than nasopharyngeal swab, presence of viral mutation(s) within the areas targeted by this assay, and inadequate number of viral copies(<138 copies/mL). A negative result must be combined with clinical observations, patient history, and epidemiological information. The expected result is Negative.  Fact Sheet for Patients:  BloggerCourse.com  Fact Sheet for Healthcare Providers:  SeriousBroker.it  This test is no t yet approved or cleared by  the [employer] and  has been authorized for detection and/or diagnosis of SARS-CoV-2 by FDA under an Emergency Use Authorization (EUA). This EUA will remain  in effect (meaning this test can be used) for the duration of the COVID-19 declaration under Section 564(b)(1) of the Act, 21 U.S.C.section 360bbb-3(b)(1), unless the authorization is terminated  or revoked sooner.       Influenza A by PCR NEGATIVE NEGATIVE Final   Influenza B by PCR NEGATIVE NEGATIVE Final    Comment: (NOTE) The Xpert Xpress SARS-CoV-2/FLU/RSV plus assay is intended as an aid in the diagnosis  of influenza from Nasopharyngeal swab specimens and should not be used as a sole basis for treatment. Nasal washings and aspirates are unacceptable for Xpert Xpress SARS-CoV-2/FLU/RSV testing.  Fact Sheet for Patients: EntrepreneurPulse.com.au  Fact Sheet for Healthcare Providers: IncredibleEmployment.be  This test is not yet approved or cleared by the [place] FDA and has been authorized for detection and/or diagnosis of SARS-CoV-2 by FDA under an Emergency Use Authorization (EUA). This EUA will remain in effect (meaning this test can be used) for the duration of the COVID-19 declaration under Section 564(b)(1) of the Act, 21 U.S.C. section 360bbb-3(b)(1), unless the authorization is terminated or revoked.     Resp Syncytial Virus by PCR NEGATIVE NEGATIVE Final    Comment: (NOTE) Fact Sheet for Patients: EntrepreneurPulse.com.au  Fact Sheet for Healthcare Providers: IncredibleEmployment.be  This test is not yet approved or cleared by the [place] FDA and has been authorized for detection and/or diagnosis of SARS-CoV-2 by FDA under an Emergency Use Authorization (EUA). This EUA will remain in effect (meaning this test can be used) for the duration of the COVID-19 declaration under Section 564(b)(1) of the Act, 21 U.S.C. section 360bbb-3(b)(1), unless the authorization is terminated or revoked.  Performed at [facility], [place] [address] [place], [place] [address]   Blood Culture (routine x 2)     Status: None (Preliminary result)   Collection Time: 06/24/22  2:35 PM   Specimen: Site Not Specified; Blood  Result Value Ref Range Status   Specimen Description   Final    SITE NOT SPECIFIED BOTTLES DRAWN AEROBIC AND ANAEROBIC Performed at [facility] [address] [place], [place] [address]    Special Requests   Final    Blood Culture results may not be  optimal due to an excessive volume of blood received in culture bottles Performed at [facility] [address], [place], [place] [address]    Culture   Final    NO GROWTH 2 DAYS Performed at [facility] [address] [place], [place] [address]    Report Status PENDING  Incomplete  Blood Culture (routine x 2)     Status: None (Preliminary result)   Collection Time: 06/24/22  8:08 PM   Specimen: BLOOD  Result Value Ref Range Status   Specimen Description   Final    BLOOD BLOOD RIGHT ARM Performed at [facility] [address], [place], [place] [address]    Special Requests   Final    BOTTLES DRAWN AEROBIC ONLY Blood Culture results may not be optimal due to an inadequate volume of blood received in culture bottles Performed at [facility] [address], [place], [place] [address]    Culture   Final    NO GROWTH 1 DAY Performed at [facility], [place] [address] [place], [place] [address]  Report Status PENDING  Incomplete     Labs: Basic Metabolic Panel: Recent Labs  Lab 06/24/22 1005 06/24/22 2008 06/25/22 0556 06/26/22 0502  NA 136  --  136 137  K 3.4*  --  3.9 3.7  CL 96*  --  106 106  CO2 26  --  22 22  GLUCOSE 99  --  88 87  BUN 16  --  8 6  CREATININE 0.97  --  0.62 0.67  CALCIUM 9.5  --  8.0* 8.5*  MG  --  1.7  --  1.9   Liver Function Tests: Recent Labs  Lab 06/24/22 1005 06/25/22 0556  AST 22 15  ALT 19 15  ALKPHOS 69 41  BILITOT 0.5 0.4  PROT 8.4* 5.6*  ALBUMIN 4.0 2.8*   Recent Labs  Lab 06/24/22 1005  LIPASE 39   No results for input(s): "AMMONIA" in the last 168 hours. CBC: Recent Labs  Lab 06/24/22 1005 06/25/22 0556 06/26/22 0502  WBC 18.7* 11.6* 9.6  NEUTROABS 14.7*  --  6.6  HGB 11.5* 8.4* 8.6*  HCT 36.9 27.7* 28.7*  MCV 81.8 83.9 83.9  PLT 205 182 202   Cardiac Enzymes: No results for input(s): "CKTOTAL", "CKMB", "CKMBINDEX", "TROPONINI" in the  last 168 hours. BNP: BNP (last 3 results) No results for input(s): "BNP" in the last 8760 hours.  ProBNP (last 3 results) No results for input(s): "PROBNP" in the last 8760 hours.  CBG: No results for input(s): "GLUCAP" in the last 168 hours.     Signed:  Irine Seal MD.  [facility] 06/26/2022, 2:37 PM

## 2022-06-26 NOTE — Plan of Care (Signed)
  Problem: Fluid Volume: Goal: Hemodynamic stability will improve Outcome: Completed/Met   Problem: Clinical Measurements: Goal: Diagnostic test results will improve Outcome: Completed/Met Goal: Signs and symptoms of infection will decrease Outcome: Completed/Met   Problem: Respiratory: Goal: Ability to maintain adequate ventilation will improve Outcome: Completed/Met   Problem: Education: Goal: Knowledge of General Education information will improve Description: Including pain rating scale, medication(s)/side effects and non-pharmacologic comfort measures Outcome: Completed/Met   Problem: Health Behavior/Discharge Planning: Goal: Ability to manage health-related needs will improve Outcome: Completed/Met   Problem: Clinical Measurements: Goal: Ability to maintain clinical measurements within normal limits will improve Outcome: Completed/Met Goal: Will remain free from infection Outcome: Completed/Met Goal: Diagnostic test results will improve Outcome: Completed/Met Goal: Respiratory complications will improve Outcome: Completed/Met Goal: Cardiovascular complication will be avoided Outcome: Completed/Met   Problem: Activity: Goal: Risk for activity intolerance will decrease Outcome: Completed/Met   Problem: Nutrition: Goal: Adequate nutrition will be maintained Outcome: Completed/Met   Problem: Coping: Goal: Level of anxiety will decrease Outcome: Completed/Met   Problem: Elimination: Goal: Will not experience complications related to bowel motility Outcome: Completed/Met Goal: Will not experience complications related to urinary retention Outcome: Completed/Met   Problem: Pain Managment: Goal: General experience of comfort will improve Outcome: Completed/Met   Problem: Safety: Goal: Ability to remain free from injury will improve Outcome: Completed/Met   Problem: Skin Integrity: Goal: Risk for impaired skin integrity will decrease Outcome:  Completed/Met   

## 2022-06-29 LAB — CULTURE, BLOOD (ROUTINE X 2): Culture: NO GROWTH

## 2022-06-30 LAB — CULTURE, BLOOD (ROUTINE X 2): Culture: NO GROWTH

## 2022-07-20 ENCOUNTER — Emergency Department (HOSPITAL_COMMUNITY): Payer: Medicaid Other

## 2022-07-20 ENCOUNTER — Other Ambulatory Visit: Payer: Self-pay

## 2022-07-20 ENCOUNTER — Emergency Department (HOSPITAL_COMMUNITY)
Admission: EM | Admit: 2022-07-20 | Discharge: 2022-07-21 | Disposition: A | Discharge status: Home / Self Care | Payer: Medicaid Other | Attending: Emergency Medicine | Admitting: Emergency Medicine

## 2022-07-20 DIAGNOSIS — N83201 Unspecified ovarian cyst, right side: Secondary | ICD-10-CM

## 2022-07-20 DIAGNOSIS — R109 Unspecified abdominal pain: Secondary | ICD-10-CM | POA: Diagnosis present

## 2022-07-20 DIAGNOSIS — R000 Tachycardia, unspecified: Secondary | ICD-10-CM | POA: Insufficient documentation

## 2022-07-20 LAB — COMPREHENSIVE METABOLIC PANEL
ALT: 14 U/L (ref 0–44)
AST: 23 U/L (ref 15–41)
Albumin: 4.1 g/dL (ref 3.5–5.0)
Alkaline Phosphatase: 79 U/L (ref 38–126)
Anion gap: 7 (ref 5–15)
BUN: 13 mg/dL (ref 6–20)
CO2: 22 mmol/L (ref 22–32)
Calcium: 9 mg/dL (ref 8.9–10.3)
Chloride: 105 mmol/L (ref 98–111)
Creatinine, Ser: 0.77 mg/dL (ref 0.44–1.00)
GFR, Estimated: >60 mL/min (ref >60)
Glucose, Bld: 108 mg/dL — ABNORMAL HIGH (ref 70–99)
Potassium: 4 mmol/L (ref 3.5–5.1)
Sodium: 134 mmol/L — ABNORMAL LOW (ref 135–145)
Total Bilirubin: 0.3 mg/dL (ref 0.3–1.2)
Total Protein: 7.9 g/dL (ref 6.5–8.1)

## 2022-07-20 LAB — I-STAT BETA HCG BLOOD, ED (MC, WL, AP ONLY): I-stat hCG, quantitative: <5 m[IU]/mL (ref <5)

## 2022-07-20 LAB — LACTIC ACID, PLASMA: Lactic Acid, Venous: 1.3 mmol/L (ref 0.5–1.9)

## 2022-07-20 MED ORDER — IOHEXOL 300 MG/ML  SOLN
100.0000 mL | Freq: Once | INTRAMUSCULAR | Status: AC | PRN
  Administered 2022-07-20: 80 mL via INTRAVENOUS

## 2022-07-20 MED ORDER — HYDROMORPHONE HCL 1 MG/ML IJ SOLN
1.0000 mg | Freq: Once | INTRAMUSCULAR | Status: AC
  Administered 2022-07-20: 1 mg via INTRAVENOUS
  Filled 2022-07-20: qty 1

## 2022-07-20 MED ORDER — ONDANSETRON HCL 4 MG/2ML IJ SOLN
4.0000 mg | Freq: Once | INTRAMUSCULAR | Status: AC
  Administered 2022-07-20: 4 mg via INTRAVENOUS
  Filled 2022-07-20: qty 2

## 2022-07-20 NOTE — ED Triage Notes (Signed)
Pt with recent admission for infection. She  states had 2 surgeries last month for issues with her kidneys and stent placements.  Progressively worsening pain with vomiting throughout the week this week.  Right flank pain radiating around to lower abdomen.

## 2022-07-20 NOTE — ED Provider Notes (Signed)
[facility] Provider Note   CSN: [id number] Arrival date & time: 07/20/22  2210     History  Chief Complaint  Patient presents with   Flank Pain    Heather Fox is a 27 y.o. female.  The history is provided by the patient and medical records.  Flank Pain   27 y.o. F with history of kidney stones, recent basket retrieval with stenting complicated by development of pyelonephritis, presenting to the ED with right flank pain.  Discharged from hospital on 06/26/2022 on oral Bactrim.  States she completed full course of the medication and felt better for a few days but symptoms have slowly recurred.  Reports over the past 7 to 10 days she has had intermittent right flank pain, more consistent over the past 3 to 4 days.  She denies fever but has been having hot/cold chills.  She has had some nausea/vomiting.  No diarrhea.  Denies current urinary symptoms.  States she did talk with her PCP who recommended she seek evaluation.  She has been taking tylenol and motrin at home without relief.  Home Medications Prior to Admission medications   Medication Sig Start Date End Date Taking? Authorizing Provider  acetaminophen (TYLENOL) 500 MG tablet Take 500 mg by mouth every 6 (six) hours as needed for mild pain.    [provider]  Galcanezumab-gnlm (EMGALITY) 120 MG/ML SOAJ Inject 1 Pen into the skin every 30 (thirty) days. Patient not taking: Reported on 06/13/2022 01/30/22   Genia Harold, MD  oxyCODONE (ROXICODONE) 5 MG immediate release tablet Take 1 tablet (5 mg total) by mouth every 4 (four) hours as needed for moderate pain. 06/26/22   Eugenie Filler, MD  polyethylene glycol (MIRALAX / GLYCOLAX) 17 g packet Take 17 g by mouth daily. 06/27/22   Eugenie Filler, MD  promethazine (PHENERGAN) 25 MG tablet Take 25 mg by mouth every 6 (six) hours as needed for nausea or vomiting. 06/22/22   [provider]  Rimegepant Sulfate  (NURTEC) 75 MG TBDP Take 75 mg by mouth as needed (for migraines). Max dose 1 pill in 24 hours Patient not taking: Reported on 06/24/2022 01/30/22   Genia Harold, MD  senna-docusate (SENOKOT-S) 8.6-50 MG tablet Take 1 tablet by mouth 2 (two) times daily. 06/26/22   Eugenie Filler, MD      Allergies    Aleve [naproxen]    Review of Systems   Review of Systems  Gastrointestinal:  Positive for nausea and vomiting.  Genitourinary:  Positive for flank pain.  All other systems reviewed and are negative.   Physical Exam Updated Vital Signs BP (!) 116/92 (BP Location: Right Arm)   Pulse (!) 117   Temp 98.1 F (36.7 C) (Oral)   Resp 18   Ht '5\' 5"'$  (1.651 m)   Wt 70.3 kg   SpO2 100%   BMI 25.79 kg/m   Physical Exam Vitals and nursing note reviewed.  Constitutional:      Appearance: She is well-developed.     Comments: Uncomfortable appearing  HENT:     Head: Normocephalic and atraumatic.  Eyes:     Conjunctiva/sclera: Conjunctivae normal.     Pupils: Pupils are equal, round, and reactive to light.  Cardiovascular:     Rate and Rhythm: Regular rhythm. Tachycardia present.     Heart sounds: Normal heart sounds.     Comments: Tachy 110's during exam Pulmonary:     Effort: Pulmonary effort  is normal. No respiratory distress.     Breath sounds: Normal breath sounds. No rhonchi.  Abdominal:     General: Bowel sounds are normal.     Palpations: Abdomen is soft.     Tenderness: There is right CVA tenderness.  Musculoskeletal:        General: Normal range of motion.     Cervical back: Normal range of motion.  Skin:    General: Skin is warm and dry.  Neurological:     Mental Status: She is alert and oriented to person, place, and time.     ED Results / Procedures / Treatments   Labs (all labs ordered are listed, but only abnormal results are displayed) Labs Reviewed  COMPREHENSIVE METABOLIC PANEL - Abnormal; Notable for the following components:      Result Value    Sodium 134 (*)    Glucose, Bld 108 (*)    All other components within normal limits  URINALYSIS, ROUTINE W REFLEX MICROSCOPIC - Abnormal; Notable for the following components:   APPearance HAZY (*)    Specific Gravity, Urine >1.046 (*)    Hgb urine dipstick LARGE (*)    Bacteria, UA RARE (*)    All other components within normal limits  CBC WITH DIFFERENTIAL/PLATELET - Abnormal; Notable for the following components:   Hemoglobin 9.8 (*)    HCT 31.9 (*)    MCH 25.2 (*)    RDW 18.4 (*)    All other components within normal limits  LACTIC ACID, PLASMA  CBC WITH DIFFERENTIAL/PLATELET  I-STAT BETA HCG BLOOD, ED ([facility], [facility], [facility] ONLY)    EKG None  Radiology US PELVIC COMPLETE W TRANSVAGINAL AND TORSION R/O  Result Date: 07/21/2022 CLINICAL DATA:  Right lower quadrant abdominal pain. EXAM: TRANSABDOMINAL AND TRANSVAGINAL ULTRASOUND OF PELVIS DOPPLER ULTRASOUND OF OVARIES TECHNIQUE: Both transabdominal and transvaginal ultrasound examinations of the pelvis were performed. Transabdominal technique was performed for global imaging of the pelvis including uterus, ovaries, adnexal regions, and pelvic cul-de-sac. It was necessary to proceed with endovaginal exam following the transabdominal exam to visualize the uterus, endometrium, bilateral ovaries and bilateral adnexa. Color and duplex Doppler ultrasound was utilized to evaluate blood flow to the ovaries. COMPARISON:  May 27, 2022 FINDINGS: Uterus Measurements: 9.8 cm x 4.8 cm x 5.4 cm = volume: 131.9 mL. No fibroids or other mass visualized. Endometrium Thickness: 12 mm.  No focal abnormality visualized. Right ovary Measurements: 6.2 cm x 2.6 cm x 4.3 cm = volume: 35.7 mL. A 3.2 cm x 1.9 cm x 3.1 cm hyperechoic lesion is seen within the right ovary. This represents a new finding when compared to the prior study. No abnormal flow is seen within this region on color Doppler evaluation. Left ovary Measurements: 5.7 cm x 3.3 cm x 4.4 cm = volume: 44.2 mL.  A 4.6 cm x 3.5 cm x 4.1 cm singularly septated left renal cyst is seen. Pulsed Doppler evaluation of both ovaries demonstrates normal low-resistance arterial and venous waveforms. Other findings A trace amount of pelvic free fluid is noted. IMPRESSION: 1. Hyperechoic right ovarian lesion which represents a new finding when compared to the prior exam and may represent a hemorrhagic right ovarian cyst. 2. Large septated left ovarian cyst. Electronically Signed   By: Virgina Norfolk M.D.   On: 07/21/2022 01:06   CT ABDOMEN PELVIS W CONTRAST  Result Date: 07/20/2022 CLINICAL DATA:  Pyelonephritis suspected. EXAM: CT ABDOMEN AND PELVIS WITH CONTRAST TECHNIQUE: Multidetector CT imaging of the abdomen  and pelvis was performed using the standard protocol following bolus administration of intravenous contrast. RADIATION DOSE REDUCTION: This exam was performed according to the departmental dose-optimization program which includes automated exposure control, adjustment of the mA and/or kV according to patient size and/or use of iterative reconstruction technique. CONTRAST:  55m OMNIPAQUE IOHEXOL 300 MG/ML  SOLN COMPARISON:  CT abdomen and pelvis 06/24/2022 FINDINGS: Lower chest: No acute abnormality. Hepatobiliary: No focal liver abnormality is seen. No gallstones, gallbladder wall thickening, or biliary dilatation. Pancreas: Unremarkable. No pancreatic ductal dilatation or surrounding inflammatory changes. Spleen: Normal in size without focal abnormality. Adrenals/Urinary Tract: Adrenal glands are unremarkable. Kidneys are normal, without renal calculi, focal lesion, or hydronephrosis. Bladder is unremarkable. Stomach/Bowel: Stomach is within normal limits. Appendix appears normal. No evidence of bowel wall thickening, distention, or inflammatory changes. Vascular/Lymphatic: No significant vascular findings are present. No enlarged abdominal or pelvic lymph nodes. Reproductive: There is a new 4.6 x 3.0 x 4.1 cm cystic  area in the left adnexa. There is a new rounded rim enhancing low-attenuation area in the right adnexa measuring 2.5 x 2.9 by from 3.0 cm. Uterus is within normal limits. Other: No abdominal wall hernia or abnormality. No abdominopelvic ascites. Musculoskeletal: No acute or significant osseous findings. IMPRESSION: 1. No evidence for renal or ureteral calculus or hydronephrosis. 2. New bilateral adnexal low-attenuation/cystic areas measuring 4.6 cm on the left and 2.9 cm on the right. Right-sided low-attenuation area appears slightly complex. Recommend further evaluation with pelvic ultrasound. Electronically Signed   By: ARonney AstersM.D.   On: 07/20/2022 23:56    Procedures Procedures    Medications Ordered in ED Medications  ondansetron (ZOFRAN) injection 4 mg (4 mg Intravenous Given 07/20/22 2257)  HYDROmorphone (DILAUDID) injection 1 mg (1 mg Intravenous Given 07/20/22 2256)  iohexol (OMNIPAQUE) 300 MG/ML solution 100 mL (80 mLs Intravenous Contrast Given 07/20/22 2341)    ED Course/ Medical Decision Making/ A&P                             Medical Decision Making Amount and/or Complexity of Data Reviewed Labs: ordered. Radiology: ordered and independent interpretation performed. ECG/medicine tests: ordered and independent interpretation performed.  Risk Prescription drug management.   27y.o. F here with right sided flank/abdominal pain.  Complex urologic history-- recent stone retrieval with stenting complicated by pyelonephritis earlier in the month.  She is afebrile, non-toxic but does seem uncomfortable.  She is tachycardic but BP stable.  Right CVA tenderness noted on exam.  Given history, concern for possible recurrent stone vs pyelonephritis or similar.  Will check labs, lactate, UA, CT.    Labs as above-- lactic 1.3.  WBC count 10.9.  Chemistry is reassuring, normal renal function. CT pending.    12:06 AM Patient reassessed--- pain is better controlld.  Tachycardia has  resolved, HR 90's now on monitoring.  Discussed CT results-- no pyelonephritis or acute stone, does have findings in the pelvis.  She does report hx of ovarian cysts in the past.  Radiology has recommended [place] she is agreeable to this.  1:16 AM [place] findings of likely right hemorrhagic cyst.  Normal flow to both ovaries, no findings of torsion.  UA with rare bacteria, no leukocytes.  Denies any current urinary symptoms.  Suspect symptoms related to cyst.  She does have a known history of same and has OB-GYN to follow-up with.  Given short supply pain medication to use PRN, advised  to take with caution as it can cause drowsiness if planning to drive, etc.  She can return here for new concerns.   Impression(s) / ED Diagnoses Final diagnoses:  Right ovarian cyst    Rx / DC Orders ED Discharge Orders          Ordered    HYDROcodone-acetaminophen (NORCO/VICODIN) 5-325 MG tablet  Every 4 hours PRN        07/21/22 0118              Larene Pickett, PA-C 07/21/22 0126    Margette Fast, MD 07/24/22 [phone]

## 2022-07-21 ENCOUNTER — Emergency Department (HOSPITAL_COMMUNITY): Payer: Medicaid Other

## 2022-07-21 LAB — CBC WITH DIFFERENTIAL/PLATELET
Abs Immature Granulocytes: 0.07 10*3/uL (ref 0.00–0.07)
Basophils Absolute: 0 10*3/uL (ref 0.0–0.1)
Basophils Relative: 0 %
Eosinophils Absolute: 0.1 10*3/uL (ref 0.0–0.5)
Eosinophils Relative: 1 %
HCT: 31.9 % — ABNORMAL LOW (ref 36.0–46.0)
Hemoglobin: 9.8 g/dL — ABNORMAL LOW (ref 12.0–15.0)
Immature Granulocytes: 1 %
Lymphocytes Relative: 27 %
Lymphs Abs: 2.7 10*3/uL (ref 0.7–4.0)
MCH: 25.2 pg — ABNORMAL LOW (ref 26.0–34.0)
MCHC: 30.7 g/dL (ref 30.0–36.0)
MCV: 82 fL (ref 80.0–100.0)
Monocytes Absolute: 1 10*3/uL (ref 0.1–1.0)
Monocytes Relative: 10 %
Neutro Abs: 6.2 10*3/uL (ref 1.7–7.7)
Neutrophils Relative %: 61 %
Platelets: 233 10*3/uL (ref 150–400)
RBC: 3.89 MIL/uL (ref 3.87–5.11)
RDW: 18.4 % — ABNORMAL HIGH (ref 11.5–15.5)
WBC: 10 10*3/uL (ref 4.0–10.5)
nRBC: 0 % (ref 0.0–0.2)

## 2022-07-21 LAB — URINALYSIS, ROUTINE W REFLEX MICROSCOPIC
Bilirubin Urine: NEGATIVE
Glucose, UA: NEGATIVE mg/dL
Ketones, ur: NEGATIVE mg/dL
Leukocytes,Ua: NEGATIVE
Nitrite: NEGATIVE
Protein, ur: NEGATIVE mg/dL
Specific Gravity, Urine: >1.046 — ABNORMAL HIGH (ref 1.005–1.030)
pH: 7 (ref 5.0–8.0)

## 2022-07-21 MED ORDER — HYDROCODONE-ACETAMINOPHEN 5-325 MG PO TABS: 1.0000 | ORAL_TABLET | ORAL | 0 refills | Status: DC | PRN

## 2022-07-21 NOTE — Discharge Instructions (Signed)
Take the prescribed medication as directed.  Use caution if driving, etc. Follow-up with your OB-GYN-- likely will need repeat ultrasound in about 6-12 weeks. Return to the ED for new or worsening symptoms.

## 2022-08-31 ENCOUNTER — Encounter (HOSPITAL_COMMUNITY): Payer: Self-pay | Admitting: *Deleted

## 2022-08-31 ENCOUNTER — Ambulatory Visit (HOSPITAL_COMMUNITY)
Admission: EM | Admit: 2022-08-31 | Discharge: 2022-08-31 | Disposition: A | Discharge status: Home / Self Care | Payer: Medicaid Other | Attending: Emergency Medicine | Admitting: Emergency Medicine

## 2022-08-31 DIAGNOSIS — J302 Other seasonal allergic rhinitis: Secondary | ICD-10-CM | POA: Insufficient documentation

## 2022-08-31 DIAGNOSIS — Z113 Encounter for screening for infections with a predominantly sexual mode of transmission: Secondary | ICD-10-CM | POA: Diagnosis present

## 2022-08-31 LAB — POCT RAPID STREP A, ED / UC: Streptococcus, Group A Screen (Direct): NEGATIVE

## 2022-08-31 NOTE — ED Provider Notes (Signed)
[facility]    CSN: [id number] Arrival date & time: 08/31/22  1121      History   Chief Complaint Chief Complaint  Patient presents with   SEXUALLY TRANSMITTED DISEASE   Sore Throat    HPI Heather Fox is a 27 y.o. female.  Here for STD testing New partner, unprotected intercourse Denies symptoms or known exposure. Was concerned for herpes. Denies any lesions or ulcerations. No rash.  Also 3-4 days of scratchy throat, some runny nose/post nasal drip No medicine taken yet No fever, NVD, cough. Drinking fluids  Past Medical History:  Diagnosis Date   Anemia    GERD (gastroesophageal reflux disease)    History of kidney stones    Migraine     Patient Active Problem List   Diagnosis Date Noted   Iron deficiency anemia 06/25/2022   Acute pyelonephritis 06/24/2022   SIRS (systemic inflammatory response syndrome) 06/24/2022   Hypokalemia 06/24/2022   Normocytic anemia 06/24/2022   Pelvic pain 04/18/2022   Visit for routine gyn exam 09/07/2021   Possible exposure to STD 09/07/2021   Vitamin D deficiency 04/27/2017   GERD (gastroesophageal reflux disease) 12/21/2014   DUB (dysfunctional uterine bleeding) 01/12/2012    Past Surgical History:  Procedure Laterality Date   CYSTOSCOPY WITH RETROGRADE PYELOGRAM, URETEROSCOPY AND STENT PLACEMENT Right 05/30/2022   Procedure: CYSTOSCOPY WITH RETROGRADE PYELOGRAM, URETEROSCOPY, HOLMIUM LASER AND STENT PLACEMENT;  Surgeon: Noel Christmas, MD;  Location: [facility];  Service: Urology;  Laterality: Right;   CYSTOSCOPY WITH RETROGRADE PYELOGRAM, URETEROSCOPY AND STENT PLACEMENT Right 06/20/2022   Procedure: CYSTOSCOPY WITH RETROGRADE PYELOGRAM, URETEROSCOPY AND STENT PLACEMENT;  Surgeon: Noel Christmas, MD;  Location: [facility];  Service: Urology;  Laterality: Right;  1 HR   NO PAST SURGERIES     WISDOM TOOTH EXTRACTION      OB History     Gravida  4   Para  2   Term  2   Preterm      AB  2   Living   2      SAB      IAB  2   Ectopic      Multiple  0   Live Births  2            Home Medications    Prior to Admission medications   Medication Sig Start Date End Date Taking? Authorizing Provider  acetaminophen (TYLENOL) 500 MG tablet Take 500 mg by mouth every 6 (six) hours as needed for mild pain.    [provider]  Galcanezumab-gnlm (EMGALITY) 120 MG/ML SOAJ Inject 1 Pen into the skin every 30 (thirty) days. Patient not taking: Reported on 06/13/2022 01/30/22   Ocie Doyne, MD  HYDROcodone-acetaminophen (NORCO/VICODIN) 5-325 MG tablet Take 1 tablet by mouth every 4 (four) hours as needed. 07/21/22   Garlon Hatchet, PA-C  oxyCODONE (ROXICODONE) 5 MG immediate release tablet Take 1 tablet (5 mg total) by mouth every 4 (four) hours as needed for moderate pain. 06/26/22   Rodolph Bong, MD  polyethylene glycol (MIRALAX / GLYCOLAX) 17 g packet Take 17 g by mouth daily. 06/27/22   Rodolph Bong, MD  promethazine (PHENERGAN) 25 MG tablet Take 25 mg by mouth every 6 (six) hours as needed for nausea or vomiting. 06/22/22   [provider]  Rimegepant Sulfate (NURTEC) 75 MG TBDP Take 75 mg by mouth as needed (for migraines). Max dose 1 pill in 24 hours Patient not  taking: Reported on 06/24/2022 01/30/22   Ocie Doyne, MD  senna-docusate (SENOKOT-S) 8.6-50 MG tablet Take 1 tablet by mouth 2 (two) times daily. 06/26/22   Rodolph Bong, MD    Family History Family History  Problem Relation Age of Onset   Stroke Mother    Cancer Neg Hx    Diabetes Neg Hx    Heart disease Neg Hx    Hypertension Neg Hx    Hearing loss Neg Hx     Social History Social History   Tobacco Use   Smoking status: Every Day    Packs/day: .25    Types: Cigarettes   Smokeless tobacco: Never   Tobacco comments:    quit summer 2018  Vaping Use   Vaping Use: Never used  Substance Use Topics   Alcohol use: No   Drug use: Yes    Types: Marijuana     Allergies   Aleve  [naproxen]   Review of Systems Review of Systems As per HPI  Physical Exam Triage Vital Signs ED Triage Vitals  Enc Vitals Group     BP 08/31/22 1246 111/79     Pulse Rate 08/31/22 1246 94     Resp 08/31/22 1246 18     Temp 08/31/22 1246 98.2 F (36.8 C)     Temp Source 08/31/22 1246 Oral     SpO2 08/31/22 1246 98 %     Weight --      Height --      Head Circumference --      Peak Flow --      Pain Score 08/31/22 1244 7     Pain Loc --      Pain Edu? --      Excl. in GC? --    No data found.  Updated Vital Signs BP 111/79 (BP Location: Left Arm)   Pulse 94   Temp 98.2 F (36.8 C) (Oral)   Resp 18   LMP 08/20/2022   SpO2 98%     Physical Exam Vitals and nursing note reviewed.  Constitutional:      General: She is not in acute distress. HENT:     Right Ear: Tympanic membrane and ear canal normal.     Left Ear: Tympanic membrane and ear canal normal.     Nose: No congestion or rhinorrhea.     Mouth/Throat:     Mouth: Mucous membranes are moist.     Pharynx: Oropharynx is clear. Posterior oropharyngeal erythema present.     Tonsils: No tonsillar exudate or tonsillar abscesses. 1+ on the right. 1+ on the left.  Eyes:     Conjunctiva/sclera: Conjunctivae normal.  Cardiovascular:     Rate and Rhythm: Normal rate and regular rhythm.     Pulses: Normal pulses.     Heart sounds: Normal heart sounds.  Pulmonary:     Effort: Pulmonary effort is normal.     Breath sounds: Normal breath sounds.  Musculoskeletal:     Cervical back: Normal range of motion.  Lymphadenopathy:     Cervical: No cervical adenopathy.  Skin:    General: Skin is warm and dry.  Neurological:     Mental Status: She is alert and oriented to person, place, and time.     UC Treatments / Results  Labs (all labs ordered are listed, but only abnormal results are displayed) Labs Reviewed  CULTURE, GROUP A STREP [facility])  POCT RAPID STREP A, ED / UC  CERVICOVAGINAL ANCILLARY ONLY  EKG  Radiology No results found.  Procedures Procedures (including critical care time)  Medications Ordered in UC Medications - No data to display  Initial Impression / Assessment and Plan / UC Course  I have reviewed the triage vital signs and the nursing notes.  Pertinent labs & imaging results that were available during my care of the patient were reviewed by me and considered in my medical decision making (see chart for details).  Rapid strep negative, culture pending Suspect allergies versus viral etiology.  Recommend starting once daily allergy med, other symptomatic care with salt water gargles, lozenges. She cannot take NSAIDs or tylenol per urology.   Cytology swab pending. Attempted to draw blood for HIV/RPR, 2 attempts unsuccessful by RT and CMA. She did have these tests in January that were negative although new partner since then. Will follow with PCP for further eval if needed. Sex counseling regarding herpes virus. No indication for swab test today - no active lesions or skin changes  Final Clinical Impressions(s) / UC Diagnoses   Final diagnoses:  Seasonal allergies  Screen for STD (sexually transmitted disease)     Discharge Instructions      We will call you if anything on your swab or blood work returns positive. Please abstain from sexual intercourse until your results return.  For sore throat and congestion I recommend starting once daily allergy medicine like Zyrtec (cetirizine). Try honey, throat lozenges, and salt water gargles. Stay hydrated!  Please return with any concerns.     ED Prescriptions   None    PDMP not reviewed this encounter.   Marlow BaarsRising, Dow Blahnik, [place]PA-C 08/31/22 1447

## 2022-08-31 NOTE — Discharge Instructions (Signed)
We will call you if anything on your swab or blood work returns positive. Please abstain from sexual intercourse until your results return.  For sore throat and congestion I recommend starting once daily allergy medicine like Zyrtec (cetirizine). Try honey, throat lozenges, and salt water gargles. Stay hydrated!  Please return with any concerns.

## 2022-08-31 NOTE — ED Triage Notes (Signed)
Pt here for multiple issues.   She states she has sore throat X 3 days and congestion. She hasn't taken any meds only drinking warm teas.    She states she got a call from a previous sexual partner from over a year ago and she was advised he was positive for genital herpes. She would like STI testing. She denies any sx.

## 2022-08-31 NOTE — ED Notes (Signed)
Pt was stuck three times and advised to follow up with PCP about labs since she was able to be drawn today. Pt verbalized understanding and has an appt next week.

## 2022-09-01 ENCOUNTER — Telehealth (HOSPITAL_COMMUNITY): Payer: Self-pay | Admitting: Emergency Medicine

## 2022-09-01 LAB — CERVICOVAGINAL ANCILLARY ONLY
Bacterial Vaginitis (gardnerella): POSITIVE — AB
Candida Glabrata: NEGATIVE
Candida Vaginitis: NEGATIVE
Chlamydia: NEGATIVE
Comment: NEGATIVE
Comment: NEGATIVE
Comment: NEGATIVE
Comment: NEGATIVE
Comment: NEGATIVE
Comment: NORMAL
Neisseria Gonorrhea: NEGATIVE
Trichomonas: NEGATIVE

## 2022-09-01 MED ORDER — METRONIDAZOLE 0.75 % VA GEL: 1.0000 | Freq: Every day | VAGINAL | 0 refills | Status: AC

## 2022-09-02 LAB — CULTURE, GROUP A STREP (THRC)

## 2022-09-20 ENCOUNTER — Other Ambulatory Visit (HOSPITAL_COMMUNITY): Payer: Self-pay

## 2022-10-10 IMAGING — US US PELVIS COMPLETE TRANSABD/TRANSVAG W DUPLEX
1 series · 13 of 25 positions shown · non-contrast
Comparison: None.

CLINICAL DATA: Pelvic pain, back pain, vaginal bleeding since
terminate pregnancy February 2021

EXAM:
TRANSABDOMINAL AND TRANSVAGINAL ULTRASOUND OF PELVIS
DOPPLER ULTRASOUND OF OVARIES
TECHNIQUE: Both transabdominal and transvaginal ultrasound examinations of the
pelvis were performed. Transabdominal technique was performed for
global imaging of the pelvis including uterus, ovaries, adnexal
regions, and pelvic cul-de-sac.
It was necessary to proceed with endovaginal exam following the
transabdominal exam to visualize the uterus, endometrium, ovaries
and adnexa. Color and duplex Doppler ultrasound was utilized to
evaluate blood flow to the ovaries.

[Series 1: us pelvic complete w transvaginal and torsion righ · 13 of 66 slices shown]
[im 1/66]
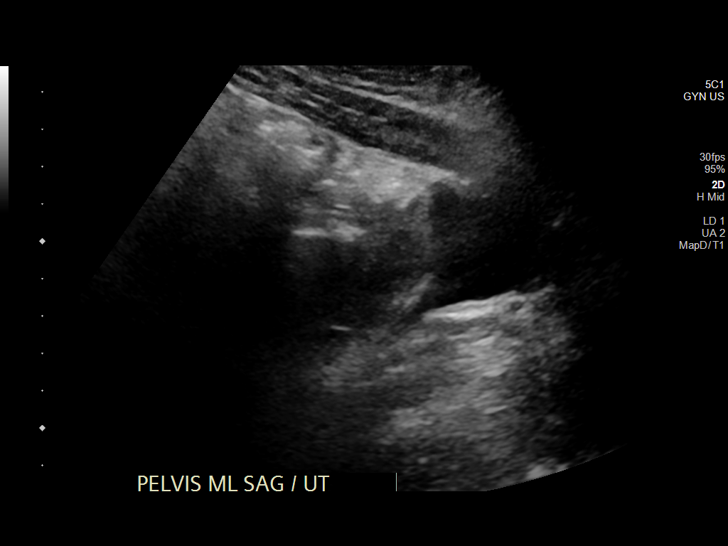
[im 6/66]
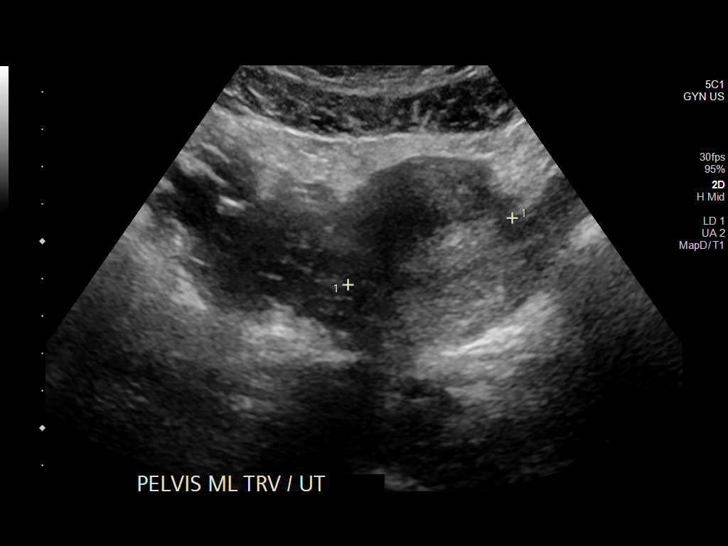
[im 11/66]
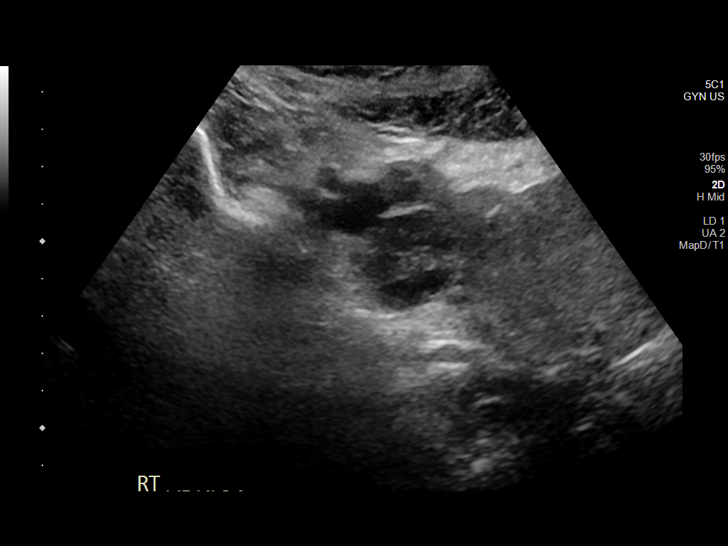
[im 17/66]
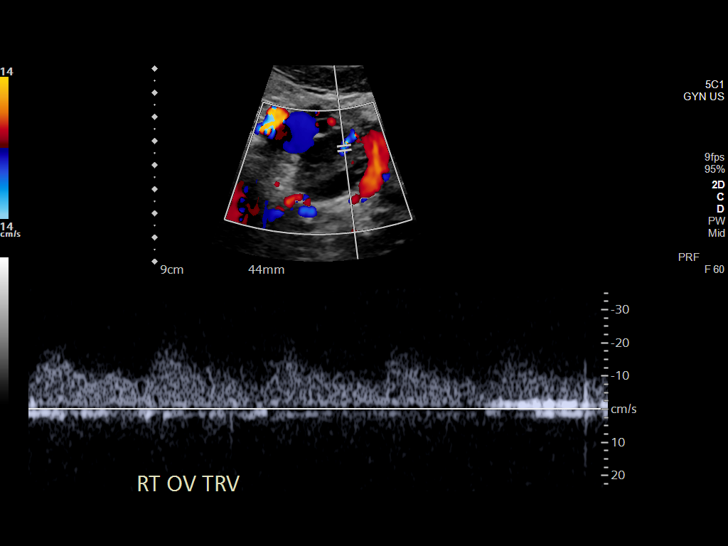
[im 22/66]
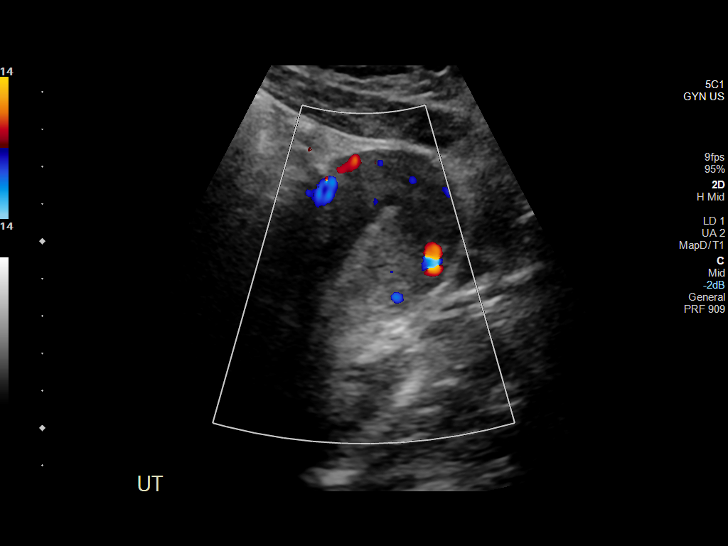
[im 28/66]
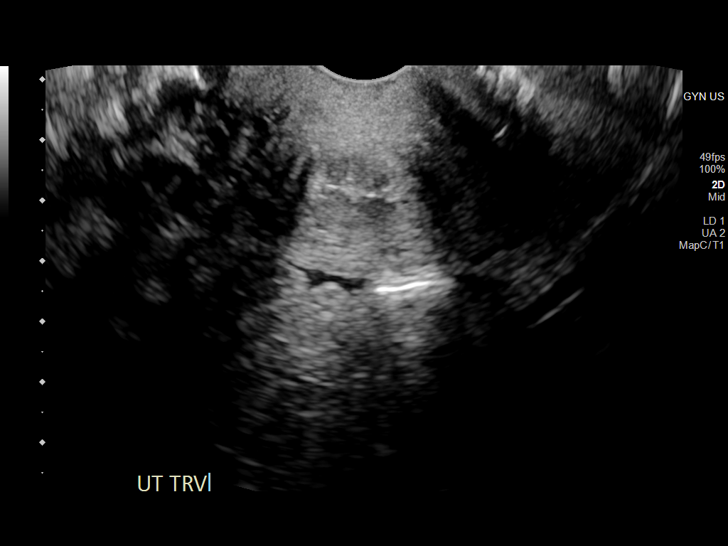
[im 33/66]
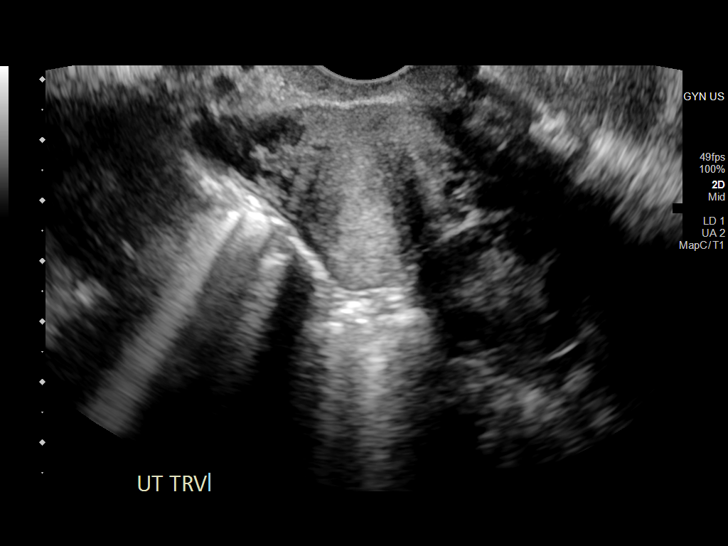
[im 38/66]
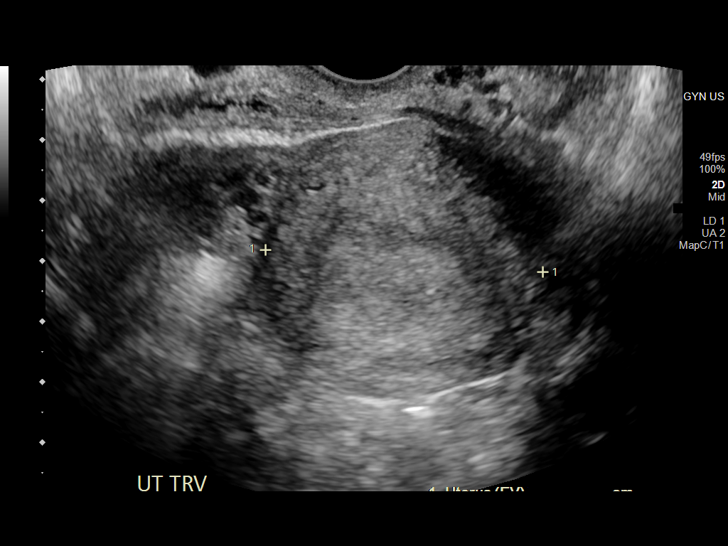
[im 44/66]
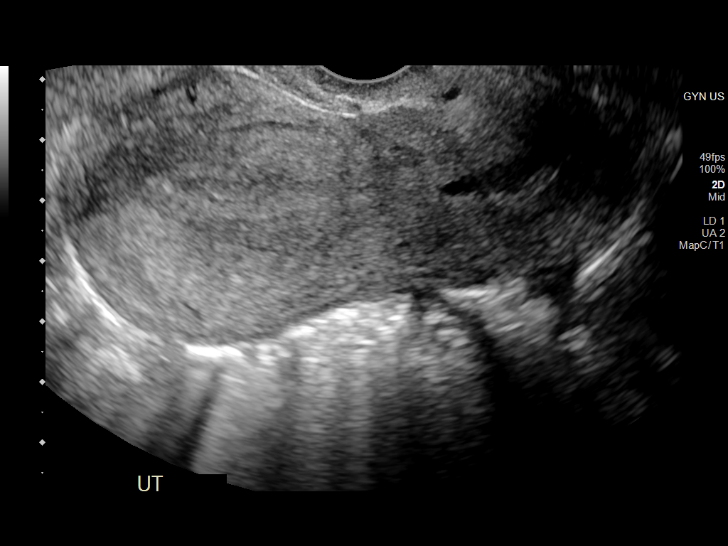
[im 49/66]
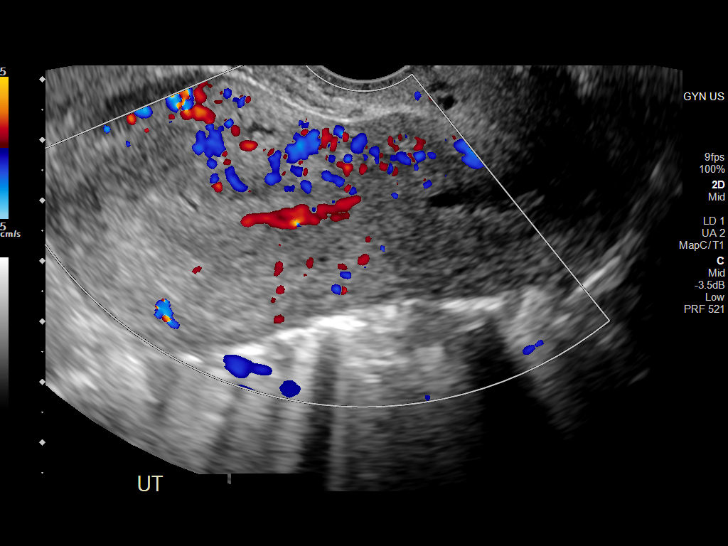
[im 55/66]
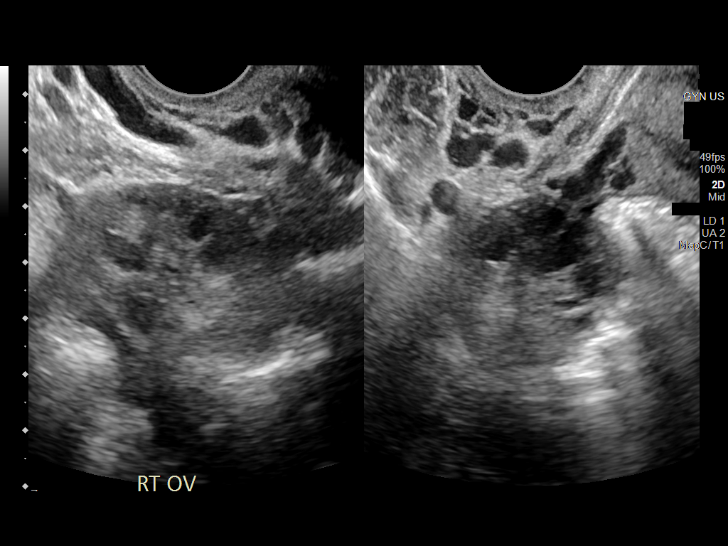
[im 60/66]
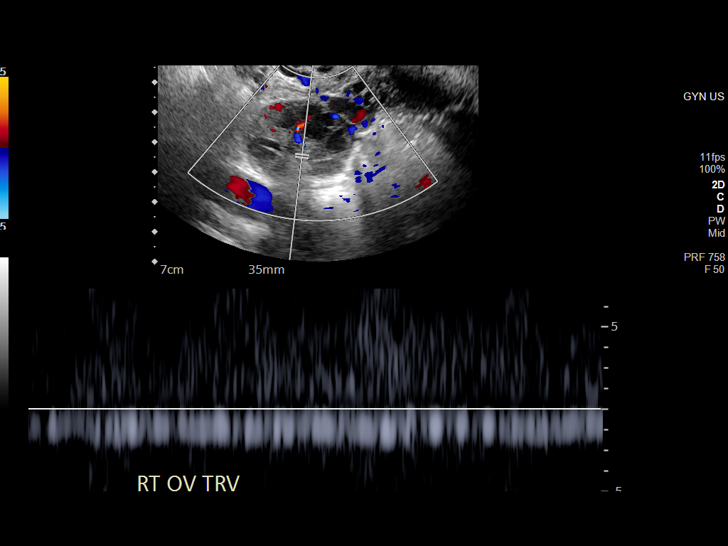
[im 66/66]
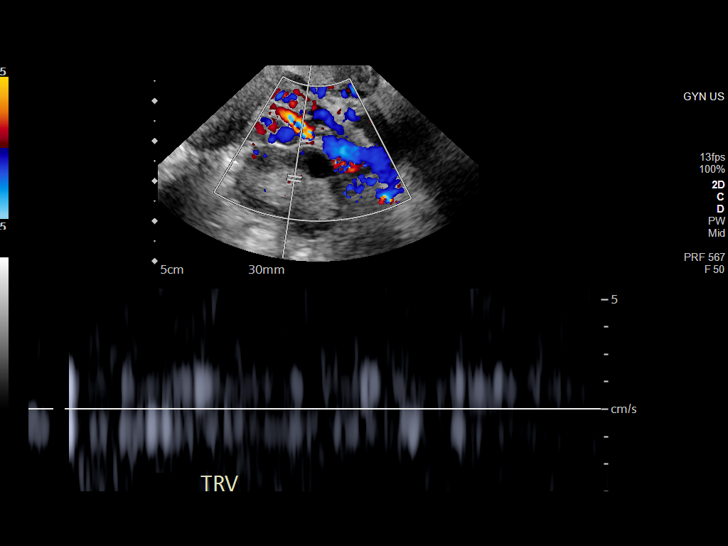

[13 of 25 positions shown; findings below may reference images not displayed]

FINDINGS: Uterus

Measurements: 9.6 x 4.6 x 4.9 cm = volume: 114 mL. No fibroids or
other mass visualized.

Endometrium

Thickness: 9 mm in thickness. No focal abnormality visualized.
Vascularity seen within the endometrium.

Right ovary

Measurements: 4.5 x 2.8 x 3.0 cm = volume: 19.6 mL. Normal
appearance/no adnexal mass.

Left ovary

Measurements: 4.1 x 1.9 x 3.5 cm = volume: 14.1 mL. Normal
appearance/no adnexal mass.

Pulsed Doppler evaluation of both ovaries demonstrates normal
low-resistance arterial and venous waveforms.

Other findings

Trace free fluid.
IMPRESSION: Endometrium normal thickness with no focal abnormality. Some
vascularity seen within the endometrium. Cannot exclude retained
products of conception.

No adnexal mass.

## 2022-10-16 ENCOUNTER — Telehealth: Payer: Self-pay | Admitting: Pharmacy Technician

## 2022-10-16 ENCOUNTER — Other Ambulatory Visit (HOSPITAL_COMMUNITY): Payer: Self-pay

## 2022-10-16 NOTE — Telephone Encounter (Signed)
Patient Advocate Encounter  Prior Authorization for Emgality 120MG /ML auto-injectors (migraine) has been approved.    PA# [id number] Key: [employer] [place] Medicaid Electronic [place] Form Effective dates: 10/16/2022 through 10/16/2023

## 2022-11-09 NOTE — Progress Notes (Deleted)
No chief complaint on file.   HISTORY OF PRESENT ILLNESS:  11/09/22 ALL:  Heather Fox is a 27 y.o. female here today for follow up for migraines. She was last seen by Dr Heather Fox 01/2022. Propranolol was stopped and she was started on Emgality and Nurtec. Since,    HISTORY (copied from Heather Fox's previous note)  27 year old female who follows in clinic for migraines.   At her last visit brain MRI was ordered. She was started on propranolol for migraine prevention and Ubrelvy for rescue.   Interval History: She has had some improvement in migraine frequency since starting propranolol. However she feels her headache severity is worse. She currently has 2 headaches per week. She has been taking propranolol inconsistently because it causes brain fog and a sensation of pressure in her head.   Heather Fox does not help much for rescue. It can take hours to start working.   Brain MRI 09/13/21 was unremarkable.     Headache days per month: 8 Headache free days per month: 22   Current Headache Regimen: Preventative: propranolol 20 mg BID Abortive: Ubrelvy 100 mg PRN   Prior Therapies                                  Amitriptyline 25 mg at bedtime - drowsiness Propranolol - brain fog Imitrex 100 mg PRN - dizziness, drowsiness Maxalt 10 mg PRN - lack of efficacy Ubrelvy 100 mg PRN - lack of efficacy   REVIEW OF SYSTEMS: Out of a complete 14 system review of symptoms, the patient complains only of the following symptoms, and all other reviewed systems are negative.   ALLERGIES: Allergies  Allergen Reactions   Aleve [Naproxen] Other (See Comments)    Swollen Throat     HOME MEDICATIONS: Outpatient Medications Prior to Visit  Medication Sig Dispense Refill   acetaminophen (TYLENOL) 500 MG tablet Take 500 mg by mouth every 6 (six) hours as needed for mild pain.     Galcanezumab-gnlm (EMGALITY) 120 MG/ML SOAJ Inject 1 Pen into the skin every 30 (thirty) days. (Patient not  taking: Reported on 06/13/2022) 1.12 mL 6   HYDROcodone-acetaminophen (NORCO/VICODIN) 5-325 MG tablet Take 1 tablet by mouth every 4 (four) hours as needed. 12 tablet 0   oxyCODONE (ROXICODONE) 5 MG immediate release tablet Take 1 tablet (5 mg total) by mouth every 4 (four) hours as needed for moderate pain. 20 tablet 0   polyethylene glycol (MIRALAX / GLYCOLAX) 17 g packet Take 17 g by mouth daily. 14 each 0   promethazine (PHENERGAN) 25 MG tablet Take 25 mg by mouth every 6 (six) hours as needed for nausea or vomiting.     Rimegepant Sulfate (NURTEC) 75 MG TBDP Take 75 mg by mouth as needed (for migraines). Max dose 1 pill in 24 hours (Patient not taking: Reported on 06/24/2022) 8 tablet 6   senna-docusate (SENOKOT-S) 8.6-50 MG tablet Take 1 tablet by mouth 2 (two) times daily.     No facility-administered medications prior to visit.     PAST MEDICAL HISTORY: Past Medical History:  Diagnosis Date   Anemia    GERD (gastroesophageal reflux disease)    History of kidney stones    Migraine      PAST SURGICAL HISTORY: Past Surgical History:  Procedure Laterality Date   CYSTOSCOPY WITH RETROGRADE PYELOGRAM, URETEROSCOPY AND STENT PLACEMENT Right 05/30/2022   Procedure: CYSTOSCOPY WITH RETROGRADE  PYELOGRAM, URETEROSCOPY, HOLMIUM LASER AND STENT PLACEMENT;  Surgeon: Heather Christmas, MD;  Location: [facility];  Service: Urology;  Laterality: Right;   CYSTOSCOPY WITH RETROGRADE PYELOGRAM, URETEROSCOPY AND STENT PLACEMENT Right 06/20/2022   Procedure: CYSTOSCOPY WITH RETROGRADE PYELOGRAM, URETEROSCOPY AND STENT PLACEMENT;  Surgeon: Heather Christmas, MD;  Location: [facility];  Service: Urology;  Laterality: Right;  1 HR   NO PAST SURGERIES     WISDOM TOOTH EXTRACTION       FAMILY HISTORY: Family History  Problem Relation Age of Onset   Stroke Mother    Cancer Neg Hx    Diabetes Neg Hx    Heart disease Neg Hx    Hypertension Neg Hx    Hearing loss Neg Hx      SOCIAL HISTORY: Social History    Socioeconomic History   Marital status: Single    Spouse name: Not on file   Number of children: 2   Years of education: Not on file   Highest education level: Not on file  Occupational History   Not on file  Tobacco Use   Smoking status: Every Day    Packs/day: .25    Types: Cigarettes   Smokeless tobacco: Never   Tobacco comments:    quit summer 2018  Vaping Use   Vaping Use: Never used  Substance and Sexual Activity   Alcohol use: No   Drug use: Yes    Types: Marijuana   Sexual activity: Yes    Partners: Male    Birth control/protection: None  Other Topics Concern   Not on file  Social History Narrative   Caffeine- coffee 1 every other day   Social Determinants of Health   Financial Resource Strain: Not on file  Food Insecurity: No Food Insecurity (06/24/2022)   Hunger Vital Sign    Worried About Running Out of Food in the Last Year: Never true    Ran Out of Food in the Last Year: Never true  Transportation Needs: No Transportation Needs (06/24/2022)   PRAPARE - Administrator, Civil Service (Medical): No    Lack of Transportation (Non-Medical): No  Physical Activity: Not on file  Stress: Not on file  Social Connections: Not on file  Intimate Partner Violence: Not At Risk (06/24/2022)   Humiliation, Afraid, Rape, and Kick questionnaire    Fear of Current or Ex-Partner: No    Emotionally Abused: No    Physically Abused: No    Sexually Abused: No     PHYSICAL EXAM  There were no vitals filed for this visit. There is no height or weight on file to calculate BMI.  Generalized: Well developed, in no acute distress  Cardiology: normal rate and rhythm, no murmur auscultated  Respiratory: clear to auscultation bilaterally    Neurological examination  Mentation: Alert oriented to time, place, history taking. Follows all commands speech and language fluent Cranial nerve II-XII: Pupils were equal round reactive to light. Extraocular movements were  full, visual field were full on confrontational test. Facial sensation and strength were normal. Uvula tongue midline. Head turning and shoulder shrug  were normal and symmetric. Motor: The motor testing reveals 5 over 5 strength of all 4 extremities. Good symmetric motor tone is noted throughout.  Sensory: Sensory testing is intact to soft touch on all 4 extremities. No evidence of extinction is noted.  Coordination: Cerebellar testing reveals good finger-nose-finger and heel-to-shin bilaterally.  Gait and station: Gait is normal. Tandem gait is normal. Romberg  is negative. No drift is seen.  Reflexes: Deep tendon reflexes are symmetric and normal bilaterally.    DIAGNOSTIC DATA (LABS, IMAGING, TESTING) - I reviewed patient records, labs, notes, testing and imaging myself where available.  Lab Results  Component Value Date   WBC 10.0 07/21/2022   HGB 9.8 (L) 07/21/2022   HCT 31.9 (L) 07/21/2022   MCV 82.0 07/21/2022   PLT 233 07/21/2022      Component Value Date/Time   NA 134 (L) 07/20/2022 2226   K 4.0 07/20/2022 2226   CL 105 07/20/2022 2226   CO2 22 07/20/2022 2226   GLUCOSE 108 (H) 07/20/2022 2226   BUN 13 07/20/2022 2226   CREATININE 0.77 07/20/2022 2226   CALCIUM 9.0 07/20/2022 2226   PROT 7.9 07/20/2022 2226   ALBUMIN 4.1 07/20/2022 2226   AST 23 07/20/2022 2226   ALT 14 07/20/2022 2226   ALKPHOS 79 07/20/2022 2226   BILITOT 0.3 07/20/2022 2226   GFRNONAA >60 07/20/2022 2226   GFRAA >60 04/22/2019 1307   No results found for: "CHOL", "HDL", "LDLCALC", "LDLDIRECT", "TRIG", "CHOLHDL" Lab Results  Component Value Date   HGBA1C 5.1 04/24/2017   No results found for: "VITAMINB12" Lab Results  Component Value Date   TSH 1.040 09/23/2019        No data to display               No data to display           ASSESSMENT AND PLAN  27 y.o. year old female  has a past medical history of Anemia, GERD (gastroesophageal reflux disease), History of kidney  stones, and Migraine. here with    No diagnosis found.  Kerry-Anne Leda Gauze ***.  Healthy lifestyle habits encouraged. *** will follow up with PCP as directed. *** will return to see me in ***, sooner if needed. *** verbalizes understanding and agreement with this plan.   No orders of the defined types were placed in this encounter.    No orders of the defined types were placed in this encounter.    Shawnie Dapper, MSN, FNP-C 11/09/2022, 1:03 PM  [facility] [address], [address] [place], [place] [address] [phone]

## 2022-11-13 ENCOUNTER — Ambulatory Visit: Payer: Medicaid Other | Admitting: Family Medicine

## 2022-11-13 ENCOUNTER — Encounter: Payer: Self-pay | Admitting: Family Medicine

## 2022-11-13 DIAGNOSIS — G43009 Migraine without aura, not intractable, without status migrainosus: Secondary | ICD-10-CM

## 2023-02-05 ENCOUNTER — Ambulatory Visit (INDEPENDENT_AMBULATORY_CARE_PROVIDER_SITE_OTHER): Payer: Medicaid Other

## 2023-02-05 DIAGNOSIS — Z3202 Encounter for pregnancy test, result negative: Secondary | ICD-10-CM

## 2023-02-05 DIAGNOSIS — N912 Amenorrhea, unspecified: Secondary | ICD-10-CM

## 2023-02-05 LAB — POCT URINE PREGNANCY: Preg Test, Ur: NEGATIVE

## 2023-02-05 NOTE — Progress Notes (Signed)
Heather Fox here for a UPT due to ameonorrhea. LMP: July 9th, 2024.   UPT in office Negative.    Pt to follow up with provider.

## 2023-02-21 NOTE — Patient Instructions (Incomplete)
Below is our plan:  We will start Qulipta 60mg  daily. Continue Nurtec as as needed when you have a migraine. If Nurtec does not work better when taking Qulipta consistently, message me and we can try Ubrelvy.   I advise against pregnancy while taking Qulipta. Discontinue 6 months prior to planned pregnancy.   Please make sure you are staying well hydrated. I recommend 50-60 ounces daily. Well balanced diet and regular exercise encouraged. Consistent sleep schedule with 6-8 hours recommended.   Please continue follow up with care team as directed.   Follow up with me in 6 months   You may receive a survey regarding today's visit. I encourage you to leave honest feed back as I do use this information to improve patient care. Thank you for seeing me today!   GENERAL HEADACHE INFORMATION:   Natural supplements: Magnesium Oxide or Magnesium Glycinate 500 mg at bed (up to 800 mg daily) Coenzyme Q10 300 mg in AM Vitamin B2- 200 mg twice a day   Add 1 supplement at a time since even natural supplements can have undesirable side effects. You can sometimes buy supplements cheaper (especially Coenzyme Q10) at www.WebmailGuide.co.za or at [facility].  Migraine with aura: There is increased risk for stroke in women with migraine with aura and a contraindication for the combined contraceptive pill for use by women who have migraine with aura. The risk for women with migraine without aura is lower. However other risk factors like smoking are far more likely to increase stroke risk than migraine. There is a recommendation for no smoking and for the use of OCPs without estrogen such as progestogen only pills particularly for women with migraine with aura.Marland Kitchen People who have migraine headaches with auras may be 3 times more likely to have a stroke caused by a blood clot, compared to migraine patients who don't see auras. Women who take hormone-replacement therapy may be 30 percent more likely to suffer a clot-based stroke  than women not taking medication containing estrogen. Other risk factors like smoking and high blood pressure may be  much more important.    Vitamins and herbs that show potential:   Magnesium: Magnesium (250 mg twice a day or 500 mg at bed) has a relaxant effect on smooth muscles such as blood vessels. Individuals suffering from frequent or daily headache usually have low magnesium levels which can be increase with daily supplementation of 400-750 mg. Three trials found 40-90% average headache reduction  when used as a preventative. Magnesium may help with headaches are aura, the best evidence for magnesium is for migraine with aura is its thought to stop the cortical spreading depression we believe is the pathophysiology of migraine aura.Magnesium also demonstrated the benefit in menstrually related migraine.  Magnesium is part of the messenger system in the serotonin cascade and it is a good muscle relaxant.  It is also useful for constipation which can be a side effect of other medications used to treat migraine. Good sources include nuts, whole grains, and tomatoes. Side Effects: loose stool/diarrhea  Riboflavin (vitamin B 2) 200 mg twice a day. This vitamin assists nerve cells in the production of ATP a principal energy storing molecule.  It is necessary for many chemical reactions in the body.  There have been at least 3 clinical trials of riboflavin using 400 mg per day all of which suggested that migraine frequency can be decreased.  All 3 trials showed significant improvement in over half of migraine sufferers.  The supplement is  found in bread, cereal, milk, meat, and poultry.  Most Americans get more riboflavin than the recommended daily allowance, however riboflavin deficiency is not necessary for the supplements to help prevent headache. Side effects: energizing, green urine   Coenzyme Q10: This is present in almost all cells in the body and is critical component for the conversion of energy.   Recent studies have shown that a nutritional supplement of CoQ10 can reduce the frequency of migraine attacks by improving the energy production of cells as with riboflavin.  Doses of 150 mg twice a day have been shown to be effective.   Melatonin: Increasing evidence shows correlation between melatonin secretion and headache conditions.  Melatonin supplementation has decreased headache intensity and duration.  It is widely used as a sleep aid.  Sleep is natures way of dealing with migraine.  A dose of 3 mg is recommended to start for headaches including cluster headache. Higher doses up to 15 mg has been reviewed for use in Cluster headache and have been used. The rationale behind using melatonin for cluster is that many theories regarding the cause of Cluster headache center around the disruption of the normal circadian rhythm in the brain.  This helps restore the normal circadian rhythm.   HEADACHE DIET: Foods and beverages which may trigger migraine Note that only 20% of headache patients are food sensitive. You will know if you are food sensitive if you get a headache consistently 20 minutes to 2 hours after eating a certain food. Only cut out a food if it causes headaches, otherwise you might remove foods you enjoy! What matters most for diet is to eat a well balanced healthy diet full of vegetables and low fat protein, and to not miss meals.   Chocolate, other sweets ALL cheeses except cottage and cream cheese Dairy products, yogurt, sour cream, ice cream Liver Meat extracts (Bovril, Marmite, meat tenderizers) Meats or fish which have undergone aging, fermenting, pickling or smoking. These include: Hotdogs,salami,Lox,sausage, mortadellas,smoked salmon, pepperoni, Pickled herring Pods of broad bean (English beans, Chinese pea pods, [place] (fava) beans, lima and navy beans Ripe avocado, ripe banana Yeast extracts or active yeast preparations such as Brewer's or Fleishman's (commercial bakes  goods are permitted) Tomato based foods, pizza (lasagna, etc.)   MSG (monosodium glutamate) is disguised as many things; look for these common aliases: Monopotassium glutamate Autolysed yeast Hydrolysed protein Sodium caseinate "flavorings" "all natural preservatives" Nutrasweet   Avoid all other foods that convincingly provoke headaches.   Resources: The Dizzy [place] Laundry Your Headache Diet, migrainestrong.com  https://zamora-andrews.com/   Caffeine and Migraine For patients that have migraine, caffeine intake more than 3 days per week can lead to dependency and increased migraine frequency. I would recommend cutting back on your caffeine intake as best you can. The recommended amount of caffeine is 200-300 mg daily, although migraine patients may experience dependency at even lower doses. While you may notice an increase in headache temporarily, cutting back will be helpful for headaches in the long run. For more information on caffeine and migraine, visit: https://americanmigrainefoundation.org/resource-library/caffeine-and-migraine/   Headache Prevention Strategies:   1. Maintain a headache diary; learn to identify and avoid triggers.  - This can be a simple note where you log when you had a headache, associated symptoms, and medications used - There are several smartphone apps developed to help track migraines: Migraine Buddy, Migraine Monitor, Curelator N1-Headache App   Common triggers include: Emotional triggers: Emotional/Upset family or friends Emotional/Upset occupation Business reversal/success Anticipation anxiety Crisis-serious  Post-crisis periodNew job/position   Physical triggers: Vacation Day Weekend Strenuous Exercise High Altitude Location New Move Menstrual Day Physical Illness Oversleep/Not enough sleep Weather changes Light: Photophobia or light sesnitivity treatment involves a balance between  desensitization and reduction in overly strong input. Use dark polarized glasses outside, but not inside. Avoid bright or fluorescent light, but do not dim environment to the point that going into a normally lit room hurts. Consider FL-41 tint lenses, which reduce the most irritating wavelengths without blocking too much light.  These can be obtained at axonoptics.com or theraspecs.com Foods: see list above.   2. Limit use of acute treatments (over-the-counter medications, triptans, etc.) to no more than 2 days per week or 10 days per month to prevent medication overuse headache (rebound headache).     3. Follow a regular schedule (including weekends and holidays): Don't skip meals. Eat a balanced diet. 8 hours of sleep nightly. Minimize stress. Exercise 30 minutes per day. Being overweight is associated with a 5 times increased risk of chronic migraine. Keep well hydrated and drink 6-8 glasses of water per day.   4. Initiate non-pharmacologic measures at the earliest onset of your headache. Rest and quiet environment. Relax and reduce stress. Breathe2Relax is a free app that can instruct you on    some simple relaxtion and breathing techniques. Http://Dawnbuse.com is a    free website that provides teaching videos on relaxation.  Also, there are  many apps that   can be downloaded for "mindful" relaxation.  An app called YOGA NIDRA will help walk you through mindfulness. Another app called Calm can be downloaded to give you a structured mindfulness guide with daily reminders and skill development. Headspace for guided meditation Mindfulness Based Stress Reduction Online Course: www.palousemindfulness.com Cold compresses.   5. Don't wait!! Take the maximum allowable dosage of prescribed medication at the first sign of migraine.   6. Compliance:  Take prescribed medication regularly as directed and at the first sign of a migraine.   7. Communicate:  Call your physician when problems arise,  especially if your headaches change, increase in frequency/severity, or become associated with neurological symptoms (weakness, numbness, slurred speech, etc.). Proceed to emergency room if you experience new or worsening symptoms or symptoms do not resolve, if you have new neurologic symptoms or if headache is severe, or for any concerning symptom.   8. Headache/pain management therapies: Consider various complementary methods, including medication, behavioral therapy, psychological counselling, biofeedback, massage therapy, acupuncture, dry needling, and other modalities.  Such measures may reduce the need for medications. Counseling for pain management, where patients learn to function and ignore/minimize their pain, seems to work very well.   9. Recommend changing family's attention and focus away from patient's headaches. Instead, emphasize daily activities. If first question of day is 'How are your headaches/Do you have a headache today?', then patient will constantly think about headaches, thus making them worse. Goal is to re-direct attention away from headaches, toward daily activities and other distractions.   10. Helpful Websites: www.AmericanHeadacheSociety.org PatentHood.ch www.headaches.org TightMarket.nl www.achenet.org

## 2023-02-21 NOTE — Progress Notes (Unsigned)
No chief complaint on file.   HISTORY OF PRESENT ILLNESS:  02/21/23 ALL:  Heather Fox is a 27 y.o. female here today for follow up for migraines. She was last seen by Dr Delena Bali 01/2022. She reported brain fog and increased intensity of migraines on propranolol. Bernita Raisin was not effective. She was switched to [employer] and Nurtec. Since,    [employer] and nurtec reported as not taking   HISTORY (copied from Dr Quentin Mulling previous note)  27 year old female who follows in clinic for migraines.   At her last visit brain MRI was ordered. She was started on propranolol for migraine prevention and Ubrelvy for rescue.   Interval History: She has had some improvement in migraine frequency since starting propranolol. However she feels her headache severity is worse. She currently has 2 headaches per week. She has been taking propranolol inconsistently because it causes brain fog and a sensation of pressure in her head.   Bernita Raisin does not help much for rescue. It can take hours to start working.   Brain MRI 09/13/21 was unremarkable.   Headache days per month: 8 Headache free days per month: 22   Current Headache Regimen: Preventative: propranolol 20 mg BID Abortive: Ubrelvy 100 mg PRN   Prior Therapies                                  Amitriptyline 25 mg at bedtime - drowsiness Propranolol - brain fog Imitrex 100 mg PRN - dizziness, drowsiness Maxalt 10 mg PRN - lack of efficacy Ubrelvy 100 mg PRN - lack of efficacy   REVIEW OF SYSTEMS: Out of a complete 14 system review of symptoms, the patient complains only of the following symptoms, and all other reviewed systems are negative.   ALLERGIES: Allergies  Allergen Reactions   Aleve [Naproxen] Other (See Comments)    Swollen Throat     HOME MEDICATIONS: Outpatient Medications Prior to Visit  Medication Sig Dispense Refill   acetaminophen (TYLENOL) 500 MG tablet Take 500 mg by mouth every 6 (six) hours as needed for mild  pain.     Galcanezumab-gnlm (EMGALITY) 120 MG/ML SOAJ Inject 1 Pen into the skin every 30 (thirty) days. (Patient not taking: Reported on 06/13/2022) 1.12 mL 6   HYDROcodone-acetaminophen (NORCO/VICODIN) 5-325 MG tablet Take 1 tablet by mouth every 4 (four) hours as needed. 12 tablet 0   oxyCODONE (ROXICODONE) 5 MG immediate release tablet Take 1 tablet (5 mg total) by mouth every 4 (four) hours as needed for moderate pain. 20 tablet 0   polyethylene glycol (MIRALAX / GLYCOLAX) 17 g packet Take 17 g by mouth daily. 14 each 0   promethazine (PHENERGAN) 25 MG tablet Take 25 mg by mouth every 6 (six) hours as needed for nausea or vomiting.     Rimegepant Sulfate (NURTEC) 75 MG TBDP Take 75 mg by mouth as needed (for migraines). Max dose 1 pill in 24 hours (Patient not taking: Reported on 06/24/2022) 8 tablet 6   senna-docusate (SENOKOT-S) 8.6-50 MG tablet Take 1 tablet by mouth 2 (two) times daily.     No facility-administered medications prior to visit.     PAST MEDICAL HISTORY: Past Medical History:  Diagnosis Date   Anemia    GERD (gastroesophageal reflux disease)    History of kidney stones    Migraine      PAST SURGICAL HISTORY: Past Surgical History:  Procedure Laterality  Date   CYSTOSCOPY WITH RETROGRADE PYELOGRAM, URETEROSCOPY AND STENT PLACEMENT Right 05/30/2022   Procedure: CYSTOSCOPY WITH RETROGRADE PYELOGRAM, URETEROSCOPY, HOLMIUM LASER AND STENT PLACEMENT;  Surgeon: Noel Christmas, MD;  Location: [facility];  Service: Urology;  Laterality: Right;   CYSTOSCOPY WITH RETROGRADE PYELOGRAM, URETEROSCOPY AND STENT PLACEMENT Right 06/20/2022   Procedure: CYSTOSCOPY WITH RETROGRADE PYELOGRAM, URETEROSCOPY AND STENT PLACEMENT;  Surgeon: Noel Christmas, MD;  Location: [facility];  Service: Urology;  Laterality: Right;  1 HR   NO PAST SURGERIES     WISDOM TOOTH EXTRACTION       FAMILY HISTORY: Family History  Problem Relation Age of Onset   Stroke Mother    Cancer Neg Hx    Diabetes  Neg Hx    Heart disease Neg Hx    Hypertension Neg Hx    Hearing loss Neg Hx      SOCIAL HISTORY: Social History   Socioeconomic History   Marital status: Single    Spouse name: Not on file   Number of children: 2   Years of education: Not on file   Highest education level: Not on file  Occupational History   Not on file  Tobacco Use   Smoking status: Every Day    Current packs/day: 0.25    Types: Cigarettes   Smokeless tobacco: Never   Tobacco comments:    quit summer 2018  Vaping Use   Vaping status: Never Used  Substance and Sexual Activity   Alcohol use: No   Drug use: Yes    Types: Marijuana   Sexual activity: Yes    Partners: Male    Birth control/protection: None  Other Topics Concern   Not on file  Social History Narrative   Caffeine- coffee 1 every other day   Social Determinants of Health   Financial Resource Strain: Not on file  Food Insecurity: No Food Insecurity (06/24/2022)   Hunger Vital Sign    Worried About Running Out of Food in the Last Year: Never true    Ran Out of Food in the Last Year: Never true  Transportation Needs: No Transportation Needs (06/24/2022)   PRAPARE - Administrator, Civil Service (Medical): No    Lack of Transportation (Non-Medical): No  Physical Activity: Not on file  Stress: Not on file  Social Connections: Not on file  Intimate Partner Violence: Not At Risk (06/24/2022)   Humiliation, Afraid, Rape, and Kick questionnaire    Fear of Current or Ex-Partner: No    Emotionally Abused: No    Physically Abused: No    Sexually Abused: No     PHYSICAL EXAM  There were no vitals filed for this visit. There is no height or weight on file to calculate BMI.  Generalized: Well developed, in no acute distress  Cardiology: normal rate and rhythm, no murmur auscultated  Respiratory: clear to auscultation bilaterally    Neurological examination  Mentation: Alert oriented to time, place, history taking. Follows all  commands speech and language fluent Cranial nerve II-XII: Pupils were equal round reactive to light. Extraocular movements were full, visual field were full on confrontational test. Facial sensation and strength were normal. Uvula tongue midline. Head turning and shoulder shrug  were normal and symmetric. Motor: The motor testing reveals 5 over 5 strength of all 4 extremities. Good symmetric motor tone is noted throughout.  Sensory: Sensory testing is intact to soft touch on all 4 extremities. No evidence of extinction is noted.  Coordination:  Cerebellar testing reveals good finger-nose-finger and heel-to-shin bilaterally.  Gait and station: Gait is normal. Tandem gait is normal. Romberg is negative. No drift is seen.  Reflexes: Deep tendon reflexes are symmetric and normal bilaterally.    DIAGNOSTIC DATA (LABS, IMAGING, TESTING) - I reviewed patient records, labs, notes, testing and imaging myself where available.  Lab Results  Component Value Date   WBC 10.0 07/21/2022   HGB 9.8 (L) 07/21/2022   HCT 31.9 (L) 07/21/2022   MCV 82.0 07/21/2022   PLT 233 07/21/2022      Component Value Date/Time   NA 134 (L) 07/20/2022 2226   K 4.0 07/20/2022 2226   CL 105 07/20/2022 2226   CO2 22 07/20/2022 2226   GLUCOSE 108 (H) 07/20/2022 2226   BUN 13 07/20/2022 2226   CREATININE 0.77 07/20/2022 2226   CALCIUM 9.0 07/20/2022 2226   PROT 7.9 07/20/2022 2226   ALBUMIN 4.1 07/20/2022 2226   AST 23 07/20/2022 2226   ALT 14 07/20/2022 2226   ALKPHOS 79 07/20/2022 2226   BILITOT 0.3 07/20/2022 2226   GFRNONAA >60 07/20/2022 2226   GFRAA >60 04/22/2019 1307   No results found for: "CHOL", "HDL", "LDLCALC", "LDLDIRECT", "TRIG", "CHOLHDL" Lab Results  Component Value Date   HGBA1C 5.1 04/24/2017   No results found for: "VITAMINB12" Lab Results  Component Value Date   TSH 1.040 09/23/2019        No data to display               No data to display           ASSESSMENT AND  PLAN  27 y.o. year old female  has a past medical history of Anemia, GERD (gastroesophageal reflux disease), History of kidney stones, and Migraine. here with    No diagnosis found.  Jerri Leda Gauze ***.  Healthy lifestyle habits encouraged. *** will follow up with PCP as directed. *** will return to see me in ***, sooner if needed. *** verbalizes understanding and agreement with this plan.   No orders of the defined types were placed in this encounter.    No orders of the defined types were placed in this encounter.    Shawnie Dapper, MSN, FNP-C 02/21/2023, 9:01 AM  [facility] [address] [place], [place] [address] [phone]

## 2023-02-22 ENCOUNTER — Encounter: Payer: Self-pay | Admitting: Family Medicine

## 2023-02-22 ENCOUNTER — Ambulatory Visit: Payer: Medicaid Other | Admitting: Family Medicine

## 2023-02-22 VITALS — BP 122/58 | HR 82 | Ht 64.0 in | Wt 161.5 lb

## 2023-02-22 DIAGNOSIS — G43009 Migraine without aura, not intractable, without status migrainosus: Secondary | ICD-10-CM | POA: Diagnosis not present

## 2023-02-22 MED ORDER — QULIPTA 60 MG PO TABS: 60.0000 mg | ORAL_TABLET | Freq: Every day | ORAL | 3 refills | Status: DC

## 2023-03-05 ENCOUNTER — Encounter: Payer: Self-pay | Admitting: Obstetrics and Gynecology

## 2023-03-05 ENCOUNTER — Ambulatory Visit: Payer: Medicaid Other | Admitting: Obstetrics and Gynecology

## 2023-03-05 ENCOUNTER — Other Ambulatory Visit (HOSPITAL_COMMUNITY)
Admission: RE | Admit: 2023-03-05 | Discharge: 2023-03-05 | Disposition: A | Discharge status: Home / Self Care | Payer: Medicaid Other | Source: Ambulatory Visit | Attending: Obstetrics and Gynecology | Admitting: Obstetrics and Gynecology

## 2023-03-05 VITALS — BP 109/70 | HR 98 | Ht 64.0 in | Wt 165.0 lb

## 2023-03-05 DIAGNOSIS — Z202 Contact with and (suspected) exposure to infections with a predominantly sexual mode of transmission: Secondary | ICD-10-CM | POA: Diagnosis present

## 2023-03-05 DIAGNOSIS — Z01419 Encounter for gynecological examination (general) (routine) without abnormal findings: Secondary | ICD-10-CM

## 2023-03-05 DIAGNOSIS — N938 Other specified abnormal uterine and vaginal bleeding: Secondary | ICD-10-CM | POA: Diagnosis not present

## 2023-03-05 NOTE — Progress Notes (Signed)
Heather Fox is a 27 y.o. [phone] female here for a routine annual gynecologic exam.  Current complaints: Irregular cycles .   Denies discharge, pelvic pain, problems with intercourse or other gynecologic concerns.    Gynecologic History Patient's last menstrual period was 11/28/2022 (exact date). Contraception: none Last Pap: 2023. Results were: normal Last mammogram: NA.   Obstetric History OB History  Gravida Para Term Preterm AB Living  4 2 2   2 2   SAB IAB Ectopic Multiple Live Births    2   0 2    # Outcome Date GA Lbr Len/2nd Weight Sex Type Anes PTL Lv  4 Term 01/11/15 [redacted]w[redacted]d 15:01 / 00:32 6 lb 10.9 oz (3.03 kg) M Vag-Spont EPI  LIV  3 Term 05/21/09    F Vag-Spont  N LIV  2 IAB      TAB     1 IAB      TAB       Past Medical History:  Diagnosis Date   Anemia    GERD (gastroesophageal reflux disease)    History of kidney stones    Migraine     Past Surgical History:  Procedure Laterality Date   CYSTOSCOPY WITH RETROGRADE PYELOGRAM, URETEROSCOPY AND STENT PLACEMENT Right 05/30/2022   Procedure: CYSTOSCOPY WITH RETROGRADE PYELOGRAM, URETEROSCOPY, HOLMIUM LASER AND STENT PLACEMENT;  Surgeon: Noel Christmas, MD;  Location: [facility];  Service: Urology;  Laterality: Right;   CYSTOSCOPY WITH RETROGRADE PYELOGRAM, URETEROSCOPY AND STENT PLACEMENT Right 06/20/2022   Procedure: CYSTOSCOPY WITH RETROGRADE PYELOGRAM, URETEROSCOPY AND STENT PLACEMENT;  Surgeon: Noel Christmas, MD;  Location: [facility];  Service: Urology;  Laterality: Right;  1 HR   NO PAST SURGERIES     WISDOM TOOTH EXTRACTION      Current Outpatient Medications on File Prior to Visit  Medication Sig Dispense Refill   Rimegepant Sulfate (NURTEC) 75 MG TBDP Take 75 mg by mouth as needed (for migraines). Max dose 1 pill in 24 hours 8 tablet 6   No current facility-administered medications on file prior to visit.    Allergies  Allergen Reactions   Aleve [Naproxen] Other (See Comments)    Swollen Throat     Social History   Socioeconomic History   Marital status: Single    Spouse name: Not on file   Number of children: 2   Years of education: Not on file   Highest education level: Not on file  Occupational History   Not on file  Tobacco Use   Smoking status: Every Day    Current packs/day: 0.25    Types: Cigarettes   Smokeless tobacco: Never   Tobacco comments:    quit summer 2018  Vaping Use   Vaping status: Never Used  Substance and Sexual Activity   Alcohol use: No   Drug use: Not Currently    Types: Marijuana   Sexual activity: Yes    Partners: Male    Birth control/protection: None  Other Topics Concern   Not on file  Social History Narrative   Caffeine- coffee 1 every other day   Social Determinants of Health   Financial Resource Strain: Not on file  Food Insecurity: No Food Insecurity (06/24/2022)   Hunger Vital Sign    Worried About Running Out of Food in the Last Year: Never true    Ran Out of Food in the Last Year: Never true  Transportation Needs: No Transportation Needs (06/24/2022)   PRAPARE - Transportation  Lack of Transportation (Medical): No    Lack of Transportation (Non-Medical): No  Physical Activity: Not on file  Stress: Not on file  Social Connections: Not on file  Intimate Partner Violence: Not At Risk (06/24/2022)   Humiliation, Afraid, Rape, and Kick questionnaire    Fear of Current or Ex-Partner: No    Emotionally Abused: No    Physically Abused: No    Sexually Abused: No    Family History  Problem Relation Age of Onset   Stroke Mother    Cancer Neg Hx    Diabetes Neg Hx    Heart disease Neg Hx    Hypertension Neg Hx    Hearing loss Neg Hx     The following portions of the patient's history were reviewed and updated as appropriate: allergies, current medications, past family history, past medical history, past social history, past surgical history and problem list.  Review of Systems Pertinent items are noted in HPI.    Objective:  BP 109/70   Pulse 98   Ht 5\' 4"  (1.626 m)   Wt 165 lb (74.8 kg)   LMP 11/28/2022 (Exact Date)   BMI 28.32 kg/m  Chaperone present CONSTITUTIONAL: Well-developed, well-nourished female in no acute distress.  HENT:  Normocephalic, atraumatic, External right and left ear normal. Oropharynx is clear and moist EYES: Conjunctivae and EOM are normal. Pupils are equal, round, and reactive to light. No scleral icterus.  NECK: Normal range of motion, supple, no masses.  Normal thyroid.  SKIN: Skin is warm and dry. No rash noted. Not diaphoretic. No erythema. No pallor. NEUROLGIC: Alert and oriented to person, place, and time. Normal reflexes, muscle tone coordination. No cranial nerve deficit noted. PSYCHIATRIC: Normal mood and affect. Normal behavior. Normal judgment and thought content. CARDIOVASCULAR: Normal heart rate noted, regular rhythm RESPIRATORY: Clear to auscultation bilaterally. Effort and breath sounds normal, no problems with respiration noted. BREASTS: Symmetric in size. No masses, skin changes, nipple drainage, or lymphadenopathy. ABDOMEN: Soft, normal bowel sounds, no distention noted.  No tenderness, rebound or guarding.  PELVIC: Normal appearing external genitalia; normal appearing vaginal mucosa and cervix.  No abnormal discharge noted.  Pap smear obtained.  Normal uterine size, no other palpable masses, no uterine or adnexal tenderness. MUSCULOSKELETAL: Normal range of motion. No tenderness.  No cyanosis, clubbing, or edema.  2+ distal pulses.   Assessment:  Annual gynecologic examination  STD exposure DUB   Plan:  Will check GYN U/S. STD testing as per pt request F/U in 4 weeks  Routine preventative health maintenance measures emphasized. Please refer to After Visit Summary for other counseling recommendations.    Hermina Staggers, MD, FACOG Attending Obstetrician & Gynecologist Center for [facility], [facility]

## 2023-03-05 NOTE — Progress Notes (Signed)
Pt is in office for AEX. Pt states she has not had a cycle the last 2 months, was seen in office in Sept with Neg upt.  Last cycle was in July. Pt states she did have some kidney issues and wonder if this is contributing.

## 2023-03-06 ENCOUNTER — Ambulatory Visit: Payer: Medicaid Other | Admitting: Obstetrics and Gynecology

## 2023-03-06 ENCOUNTER — Other Ambulatory Visit: Payer: Self-pay

## 2023-03-06 DIAGNOSIS — N760 Acute vaginitis: Secondary | ICD-10-CM

## 2023-03-06 LAB — CERVICOVAGINAL ANCILLARY ONLY
Bacterial Vaginitis (gardnerella): POSITIVE — AB
Candida Glabrata: NEGATIVE
Candida Vaginitis: NEGATIVE
Chlamydia: NEGATIVE
Comment: NEGATIVE
Comment: NEGATIVE
Comment: NEGATIVE
Comment: NEGATIVE
Comment: NEGATIVE
Comment: NORMAL
Neisseria Gonorrhea: NEGATIVE
Trichomonas: NEGATIVE

## 2023-03-06 LAB — HIV ANTIBODY (ROUTINE TESTING W REFLEX): HIV Screen 4th Generation wRfx: NONREACTIVE

## 2023-03-06 LAB — HEPATITIS C ANTIBODY: Hep C Virus Ab: NONREACTIVE

## 2023-03-06 LAB — TSH: TSH: 1.26 u[IU]/mL (ref 0.450–4.500)

## 2023-03-06 LAB — HEPATITIS B SURFACE ANTIGEN: Hepatitis B Surface Ag: NEGATIVE

## 2023-03-06 LAB — RPR: RPR Ser Ql: NONREACTIVE

## 2023-03-06 MED ORDER — METRONIDAZOLE 0.75 % VA GEL: 1.0000 | Freq: Every day | VAGINAL | 1 refills | Status: DC

## 2023-03-09 ENCOUNTER — Ambulatory Visit (HOSPITAL_COMMUNITY)
Admission: RE | Admit: 2023-03-09 | Discharge: 2023-03-09 | Disposition: A | Discharge status: Home / Self Care | Payer: Medicaid Other | Source: Ambulatory Visit | Attending: Obstetrics and Gynecology | Admitting: Obstetrics and Gynecology

## 2023-03-09 DIAGNOSIS — N938 Other specified abnormal uterine and vaginal bleeding: Secondary | ICD-10-CM | POA: Diagnosis present

## 2023-03-30 ENCOUNTER — Emergency Department (HOSPITAL_COMMUNITY)
Admission: EM | Admit: 2023-03-30 | Discharge: 2023-03-30 | Disposition: A | Discharge status: Home / Self Care | Payer: Medicaid Other | Attending: Emergency Medicine | Admitting: Emergency Medicine

## 2023-03-30 ENCOUNTER — Other Ambulatory Visit: Payer: Self-pay

## 2023-03-30 ENCOUNTER — Emergency Department (HOSPITAL_COMMUNITY): Payer: Medicaid Other

## 2023-03-30 DIAGNOSIS — N939 Abnormal uterine and vaginal bleeding, unspecified: Secondary | ICD-10-CM

## 2023-03-30 DIAGNOSIS — R000 Tachycardia, unspecified: Secondary | ICD-10-CM | POA: Diagnosis not present

## 2023-03-30 LAB — CBC WITH DIFFERENTIAL/PLATELET
Abs Immature Granulocytes: 0.06 10*3/uL (ref 0.00–0.07)
Basophils Absolute: 0 10*3/uL (ref 0.0–0.1)
Basophils Relative: 0 %
Eosinophils Absolute: 0 10*3/uL (ref 0.0–0.5)
Eosinophils Relative: 0 %
HCT: 30.5 % — ABNORMAL LOW (ref 36.0–46.0)
Hemoglobin: 9.8 g/dL — ABNORMAL LOW (ref 12.0–15.0)
Immature Granulocytes: 1 %
Lymphocytes Relative: 25 %
Lymphs Abs: 2.6 10*3/uL (ref 0.7–4.0)
MCH: 29.7 pg (ref 26.0–34.0)
MCHC: 32.1 g/dL (ref 30.0–36.0)
MCV: 92.4 fL (ref 80.0–100.0)
Monocytes Absolute: 0.6 10*3/uL (ref 0.1–1.0)
Monocytes Relative: 6 %
Neutro Abs: 7.2 10*3/uL (ref 1.7–7.7)
Neutrophils Relative %: 68 %
Platelets: 233 10*3/uL (ref 150–400)
RBC: 3.3 MIL/uL — ABNORMAL LOW (ref 3.87–5.11)
RDW: 14.9 % (ref 11.5–15.5)
WBC: 10.6 10*3/uL — ABNORMAL HIGH (ref 4.0–10.5)
nRBC: 0 % (ref 0.0–0.2)

## 2023-03-30 LAB — URINALYSIS, W/ REFLEX TO CULTURE (INFECTION SUSPECTED)
Bacteria, UA: NONE SEEN
RBC / HPF: >50 RBC/hpf (ref 0–5)

## 2023-03-30 LAB — HCG, QUANTITATIVE, PREGNANCY: hCG, Beta Chain, Quant, S: 1 m[IU]/mL (ref <5)

## 2023-03-30 LAB — BASIC METABOLIC PANEL
Anion gap: 7 (ref 5–15)
BUN: 13 mg/dL (ref 6–20)
CO2: 25 mmol/L (ref 22–32)
Calcium: 9.1 mg/dL (ref 8.9–10.3)
Chloride: 106 mmol/L (ref 98–111)
Creatinine, Ser: 0.8 mg/dL (ref 0.44–1.00)
GFR, Estimated: >60 mL/min (ref >60)
Glucose, Bld: 124 mg/dL — ABNORMAL HIGH (ref 70–99)
Potassium: 3.7 mmol/L (ref 3.5–5.1)
Sodium: 138 mmol/L (ref 135–145)

## 2023-03-30 MED ORDER — ACETAMINOPHEN 500 MG PO TABS
1000.0000 mg | ORAL_TABLET | Freq: Once | ORAL | Status: DC
  Filled 2023-03-30: qty 2

## 2023-03-30 NOTE — ED Triage Notes (Signed)
Pt reports period bleeding with clots since 10/29. Had similar episode before and turned out to be inflamed kidney requiring surgery. Reports some right flank pain and costovertebral angle tenderness. Endorses dizziness with exertion. Had pelvic u/s last week

## 2023-03-30 NOTE — Discharge Instructions (Signed)
While you are in the emergency department, you had labs done.  Your hemoglobin, or your red blood cells, were normal for you.  Your CT scan did not show a stone that was causing any problems.  Your pregnancy test was negative.  Please follow-up with your OB/GYN doctor on Monday.  Return to the emergency department if you feel lightheaded, dizzy, or lose consciousness.

## 2023-03-30 NOTE — ED Provider Notes (Signed)
[place] EMERGENCY DEPARTMENT AT [facility] Provider Note   CSN: [id number] Arrival date & time: 03/30/23  1026     History  Chief Complaint  Patient presents with   Vaginal Bleeding   Flank Pain    Heather Fox is a 27 y.o. female.  This is a 27 year old female who is here today for vaginal bleeding.  Patient says that she had not had a period for 2 months, and had followed up with her OB/GYN.  She says that she is also started have some pain on her right side, is concerned that it might be a recurrent stone.  She says that she had a stone within the last year.  She did require stenting.  She had a transvaginal ultrasound done last week by her OB/GYN that she reports was normal.     Vaginal Bleeding Flank Pain       Home Medications Prior to Admission medications   Medication Sig Start Date End Date Taking? Authorizing Provider  metroNIDAZOLE (METROGEL) 0.75 % vaginal gel Place 1 Applicatorful vaginally at bedtime. Apply one applicatorful to vagina at bedtime for 5 days 03/06/23   Hermina Staggers, MD  Rimegepant Sulfate (NURTEC) 75 MG TBDP Take 75 mg by mouth as needed (for migraines). Max dose 1 pill in 24 hours 01/30/22   Ocie Doyne, MD      Allergies    Aleve [naproxen]    Review of Systems   Review of Systems  Genitourinary:  Positive for flank pain and vaginal bleeding.    Physical Exam Updated Vital Signs BP 101/63   Pulse 91   Temp 98.3 F (36.8 C) (Oral)   Resp 19   Ht 5\' 4"  (1.626 m)   Wt 73.9 kg   LMP 03/20/2023 (Exact Date)   SpO2 100%   BMI 27.98 kg/m  Physical Exam Vitals reviewed.  HENT:     Head: Normocephalic.  Cardiovascular:     Rate and Rhythm: Tachycardia present.  Pulmonary:     Effort: Pulmonary effort is normal.  Abdominal:     General: Abdomen is flat.     Palpations: Abdomen is soft.     Tenderness: There is no abdominal tenderness.  Musculoskeletal:     Cervical back: Normal range of motion.   Skin:    General: Skin is warm and dry.     ED Results / Procedures / Treatments   Labs (all labs ordered are listed, but only abnormal results are displayed) Labs Reviewed  BASIC METABOLIC PANEL - Abnormal; Notable for the following components:      Result Value   Glucose, Bld 124 (*)    All other components within normal limits  CBC WITH DIFFERENTIAL/PLATELET - Abnormal; Notable for the following components:   WBC 10.6 (*)    RBC 3.30 (*)    Hemoglobin 9.8 (*)    HCT 30.5 (*)    All other components within normal limits  URINALYSIS, W/ REFLEX TO CULTURE (INFECTION SUSPECTED) - Abnormal; Notable for the following components:   Color, Urine RED (*)    APPearance TURBID (*)    Glucose, UA   (*)    Value: TEST NOT REPORTED DUE TO COLOR INTERFERENCE OF URINE PIGMENT   Hgb urine dipstick   (*)    Value: TEST NOT REPORTED DUE TO COLOR INTERFERENCE OF URINE PIGMENT   Bilirubin Urine   (*)    Value: TEST NOT REPORTED DUE TO COLOR INTERFERENCE OF URINE PIGMENT  Ketones, ur   (*)    Value: TEST NOT REPORTED DUE TO COLOR INTERFERENCE OF URINE PIGMENT   Protein, ur   (*)    Value: TEST NOT REPORTED DUE TO COLOR INTERFERENCE OF URINE PIGMENT   Nitrite   (*)    Value: TEST NOT REPORTED DUE TO COLOR INTERFERENCE OF URINE PIGMENT   Leukocytes,Ua   (*)    Value: TEST NOT REPORTED DUE TO COLOR INTERFERENCE OF URINE PIGMENT   All other components within normal limits  HCG, QUANTITATIVE, PREGNANCY    EKG None  Radiology CT Renal Stone Study  Result Date: 03/30/2023 CLINICAL DATA:  Right flank pain and costovertebral angle tenderness EXAM: CT ABDOMEN AND PELVIS WITHOUT CONTRAST TECHNIQUE: Multidetector CT imaging of the abdomen and pelvis was performed following the standard protocol without IV contrast. RADIATION DOSE REDUCTION: This exam was performed according to the departmental dose-optimization program which includes automated exposure control, adjustment of the mA and/or kV  according to patient size and/or use of iterative reconstruction technique. COMPARISON:  CT abdomen and pelvis dated 07/20/2022 FINDINGS: Lower chest: No focal consolidation or pulmonary nodule in the lung bases. No pleural effusion or pneumothorax demonstrated. Partially imaged heart size is normal. Hepatobiliary: No focal hepatic lesions. No intra or extrahepatic biliary ductal dilation. Gallbladder is contracted. Pancreas: No focal lesions or main ductal dilation. Spleen: Normal in size without focal abnormality. Adrenals/Urinary Tract: No adrenal nodules. No suspicious renal mass on this noncontrast enhanced examination or hydronephrosis. Punctate nonobstructing left renal stones. No focal bladder wall thickening. Stomach/Bowel: Normal appearance of the stomach. No evidence of bowel wall thickening, distention, or inflammatory changes. Normal appendix. Vascular/Lymphatic: No significant vascular findings are present. No enlarged abdominal or pelvic lymph nodes. Reproductive: Hyperdensity within the endometrial canal, likely blood products. No adnexal masses. Other: Small volume pelvic free fluid. No free air or fluid collection. Musculoskeletal: No acute or abnormal lytic or blastic osseous lesions. IMPRESSION: 1. Punctate nonobstructing left renal stones. No hydronephrosis. 2. Hyperdensity within the endometrial canal, likely blood products. Electronically Signed   By: Agustin Cree M.D.   On: 03/30/2023 14:50    Procedures Procedures    Medications Ordered in ED Medications  acetaminophen (TYLENOL) tablet 1,000 mg (has no administration in time range)    ED Course/ Medical Decision Making/ A&P                                 Medical Decision Making 27 year old female here today with vaginal bleeding, right sided flank pain.  Differential diagnoses include dysmenorrhea, abnormal uterine bleeding, irregular periods, nephrolithiasis, pyelonephritis.  Plan-regarding the patient's vaginal bleeding,  looks as though she has been having irregular periods somewhat frequently.  With her irregular periods, likely having increased bleeding at time of menstruation.  She was with her OB/GYN last week, had an exam performed that was normal.  She also had an ultrasound done at that time.  Do not believe repeated exam is indicated at this time.  Patient initially tachycardic, will see what her heart rate settles down after she has sat in the room.  CBC ordered.  hCG and urinalysis ordered.  Once hCG is negative, would obtain stone study on the patient.  Overall looks well.  I reviewed the patient's OB/GYN notes.  Reassessment 2:55 PM-patient's pregnancy test negative.  Hemoglobin stable and at baseline.  Renal stone study negative.  Symptoms have improved.  She is following up  with her OB/GYN on Monday.  Will discharge patient, have her follow-up with OB/GYN.  Patient agreeable with this plan.    Amount and/or Complexity of Data Reviewed Labs: ordered. Radiology: ordered.  Risk OTC drugs.           Final Clinical Impression(s) / ED Diagnoses Final diagnoses:  Vaginal bleeding    Rx / DC Orders ED Discharge Orders     None         Arletha Pili, DO 03/30/23 1456

## 2023-04-02 ENCOUNTER — Encounter: Payer: Self-pay | Admitting: Family Medicine

## 2023-04-02 ENCOUNTER — Ambulatory Visit: Payer: Medicaid Other | Admitting: Obstetrics and Gynecology

## 2023-04-02 ENCOUNTER — Encounter: Payer: Self-pay | Admitting: Obstetrics and Gynecology

## 2023-04-02 VITALS — BP 110/69 | HR 108 | Wt 163.0 lb

## 2023-04-02 DIAGNOSIS — N939 Abnormal uterine and vaginal bleeding, unspecified: Secondary | ICD-10-CM | POA: Diagnosis not present

## 2023-04-02 MED ORDER — TRANEXAMIC ACID 650 MG PO TABS: 1300.0000 mg | ORAL_TABLET | Freq: Three times a day (TID) | ORAL | 2 refills | Status: AC

## 2023-04-02 MED ORDER — FERROUS SULFATE 325 (65 FE) MG PO TBEC: 325.0000 mg | DELAYED_RELEASE_TABLET | ORAL | 3 refills | Status: AC

## 2023-04-02 NOTE — Progress Notes (Signed)
Pt is in office for u/s results.  Pt has been bleeding x 14 days, heavy with clots - was seen at ED on 11/8.

## 2023-04-02 NOTE — Progress Notes (Signed)
27 yo G4P2 with LMP 03/20/23 and BMI 27 here as an ED follow up for AUB. Patient reports amenorrhea for 2 months prior to onset of this period. She reports regular menses monthly prior to this. She describes a heavy flow with passage of clots for the past 14 days. She states she is saturating a pad every hour. She reports generalized fatigue and weakness. She denies chest pain or shortness of breath. She is sexually active using condoms. She is not interested in hormonal contraception. She is without any other complaints.   Past Medical History:  Diagnosis Date   Anemia    GERD (gastroesophageal reflux disease)    History of kidney stones    Migraine    Past Surgical History:  Procedure Laterality Date   CYSTOSCOPY WITH RETROGRADE PYELOGRAM, URETEROSCOPY AND STENT PLACEMENT Right 05/30/2022   Procedure: CYSTOSCOPY WITH RETROGRADE PYELOGRAM, URETEROSCOPY, HOLMIUM LASER AND STENT PLACEMENT;  Surgeon: Noel Christmas, MD;  Location: [facility];  Service: Urology;  Laterality: Right;   CYSTOSCOPY WITH RETROGRADE PYELOGRAM, URETEROSCOPY AND STENT PLACEMENT Right 06/20/2022   Procedure: CYSTOSCOPY WITH RETROGRADE PYELOGRAM, URETEROSCOPY AND STENT PLACEMENT;  Surgeon: Noel Christmas, MD;  Location: [facility];  Service: Urology;  Laterality: Right;  1 HR   NO PAST SURGERIES     WISDOM TOOTH EXTRACTION     Family History  Problem Relation Age of Onset   Stroke Mother    Cancer Neg Hx    Diabetes Neg Hx    Heart disease Neg Hx    Hypertension Neg Hx    Hearing loss Neg Hx    Social History   Tobacco Use   Smoking status: Every Day    Current packs/day: 0.25    Types: Cigarettes   Smokeless tobacco: Never   Tobacco comments:    quit summer 2018  Vaping Use   Vaping status: Never Used  Substance Use Topics   Alcohol use: No   Drug use: Not Currently    Types: Marijuana   ROS See pertinent in HPI. All other systems reviewed and non contributory Blood pressure 110/69, pulse (!) 108, weight  163 lb (73.9 kg), last menstrual period 03/20/2023. GENERAL: Well-developed, well-nourished female in no acute distress.  NEURO: alert and oriented x 3  CT Renal Stone Study  Result Date: 03/30/2023 CLINICAL DATA:  Right flank pain and costovertebral angle tenderness EXAM: CT ABDOMEN AND PELVIS WITHOUT CONTRAST TECHNIQUE: Multidetector CT imaging of the abdomen and pelvis was performed following the standard protocol without IV contrast. RADIATION DOSE REDUCTION: This exam was performed according to the departmental dose-optimization program which includes automated exposure control, adjustment of the mA and/or kV according to patient size and/or use of iterative reconstruction technique. COMPARISON:  CT abdomen and pelvis dated 07/20/2022 FINDINGS: Lower chest: No focal consolidation or pulmonary nodule in the lung bases. No pleural effusion or pneumothorax demonstrated. Partially imaged heart size is normal. Hepatobiliary: No focal hepatic lesions. No intra or extrahepatic biliary ductal dilation. Gallbladder is contracted. Pancreas: No focal lesions or main ductal dilation. Spleen: Normal in size without focal abnormality. Adrenals/Urinary Tract: No adrenal nodules. No suspicious renal mass on this noncontrast enhanced examination or hydronephrosis. Punctate nonobstructing left renal stones. No focal bladder wall thickening. Stomach/Bowel: Normal appearance of the stomach. No evidence of bowel wall thickening, distention, or inflammatory changes. Normal appendix. Vascular/Lymphatic: No significant vascular findings are present. No enlarged abdominal or pelvic lymph nodes. Reproductive: Hyperdensity within the endometrial canal, likely blood products. No adnexal  masses. Other: Small volume pelvic free fluid. No free air or fluid collection. Musculoskeletal: No acute or abnormal lytic or blastic osseous lesions. IMPRESSION: 1. Punctate nonobstructing left renal stones. No hydronephrosis. 2. Hyperdensity  within the endometrial canal, likely blood products. Electronically Signed   By: Agustin Cree M.D.   On: 03/30/2023 14:50   US PELVIC COMPLETE WITH TRANSVAGINAL  Result Date: 03/09/2023 CLINICAL DATA:  DUB EXAM: TRANSABDOMINAL AND TRANSVAGINAL ULTRASOUND OF PELVIS TECHNIQUE: Both transabdominal and transvaginal ultrasound examinations of the pelvis were performed. Transabdominal technique was performed for global imaging of the pelvis including uterus, ovaries, adnexal regions, and pelvic cul-de-sac. It was necessary to proceed with endovaginal exam following the transabdominal exam to visualize the ovaries. COMPARISON:  None Available. FINDINGS: Uterus Measurements: 8.7 x 4.3 x 5.3 cm = volume: 102.7 mL. No fibroids or other mass visualized. Endometrium Thickness: 9 mm.  No focal abnormality visualized. Right ovary Measurements: 4.4 x 2.5 x 2.9 cm = volume: 17 mL. Normal appearance/no adnexal mass. Left ovary Measurements: 4.2 x 2.4 x 3.1 cm = volume: 16.8 mL. Normal appearance/no adnexal mass. Other findings No abnormal free fluid. IMPRESSION: Normal pelvic ultrasound. Electronically Signed   By: Sherian Rein M.D.   On: 03/09/2023 16:44     A/P 27 yo here with AUB - CBC ordered to assess level of anemia - Rx iron provided - Patient is not interested in hormonal contraception to manage cycle - Rx TXA provided - RTC if symptoms persists - Patient current on pap smear

## 2023-04-03 ENCOUNTER — Other Ambulatory Visit: Payer: Self-pay | Admitting: Family Medicine

## 2023-04-03 LAB — CBC
Hematocrit: 28.7 % — ABNORMAL LOW (ref 34.0–46.6)
Hemoglobin: 9.1 g/dL — ABNORMAL LOW (ref 11.1–15.9)
MCH: 28.7 pg (ref 26.6–33.0)
MCHC: 31.7 g/dL (ref 31.5–35.7)
MCV: 91 fL (ref 79–97)
Platelets: 284 10*3/uL (ref 150–450)
RBC: 3.17 x10E6/uL — ABNORMAL LOW (ref 3.77–5.28)
RDW: 13.6 % (ref 11.7–15.4)
WBC: 9.6 10*3/uL (ref 3.4–10.8)

## 2023-04-03 MED ORDER — QULIPTA 60 MG PO TABS: 60.0000 mg | ORAL_TABLET | Freq: Every day | ORAL | 3 refills | Status: DC

## 2023-04-03 MED ORDER — UBRELVY 100 MG PO TABS: 100.0000 mg | ORAL_TABLET | Freq: Every day | ORAL | 11 refills | Status: DC | PRN

## 2023-04-06 ENCOUNTER — Other Ambulatory Visit (HOSPITAL_COMMUNITY): Payer: Self-pay

## 2023-04-06 ENCOUNTER — Telehealth: Payer: Self-pay

## 2023-04-06 DIAGNOSIS — G43009 Migraine without aura, not intractable, without status migrainosus: Secondary | ICD-10-CM

## 2023-04-06 NOTE — Telephone Encounter (Signed)
Pharmacy Patient Advocate Encounter  Received notification from [facility] that Prior Authorization for Ubrelvy 100MG  tablets has been APPROVED from 04/06/2023 to 04/05/2024. Ran test claim, Copay is $4.00. This test claim was processed through [facility]- copay amounts may vary at other pharmacies due to pharmacy/plan contracts, or as the patient moves through the different stages of their insurance plan.   PA #/Case ID/Reference #: PA Case ID #: [id number]

## 2023-04-06 NOTE — Telephone Encounter (Signed)
Pharmacy Patient Advocate Encounter   Received notification from RX Request Messages that prior authorization for Qulipta 60MG  tablets is required/requested.   Insurance verification completed.   The patient is insured through [facility] .   Per test claim: PA required; PA submitted to above mentioned insurance via CoverMyMeds Key/confirmation #/EOC [id number] Status is pending

## 2023-04-06 NOTE — Telephone Encounter (Signed)
Pharmacy Patient Advocate Encounter   Received notification from RX Request Messages that prior authorization for Ubrelvy 100MG  tablets is required/requested.   Insurance verification completed.   The patient is insured through [facility] .   Per test claim: PA required; PA submitted to above mentioned insurance via CoverMyMeds Key/confirmation #/EOC [id number] Status is pending

## 2023-04-09 MED ORDER — QULIPTA 60 MG PO TABS: 60.0000 mg | ORAL_TABLET | Freq: Every day | ORAL | Status: DC

## 2023-04-09 MED ORDER — UBRELVY 100 MG PO TABS: 100.0000 mg | ORAL_TABLET | ORAL | 0 refills | Status: DC | PRN

## 2023-04-09 NOTE — Telephone Encounter (Signed)
Patient informed via my chart.

## 2023-04-09 NOTE — Telephone Encounter (Signed)
Sample of Ubrevly and Qulipta left at front desk for pick up.

## 2023-04-11 MED ORDER — AJOVY 225 MG/1.5ML ~~LOC~~ SOAJ: 1.5000 mL | SUBCUTANEOUS | 3 refills | Status: DC

## 2023-04-11 MED ORDER — AJOVY 225 MG/1.5ML ~~LOC~~ SOAJ: 1.5000 mL | Freq: Once | SUBCUTANEOUS | Status: AC

## 2023-04-11 NOTE — Telephone Encounter (Signed)
Heather Fox- pt had concerns about being on [name] and pregnancy. Would she have to come off of this if she becomes pregnant?   Ok to provide sample while working on insurance PA?

## 2023-04-11 NOTE — Telephone Encounter (Signed)
Pharmacy Patient Advocate Encounter  Received notification from [facility] that Prior Authorization for Qulipta 60MG  tablet has been DENIED.  Full denial letter will be uploaded to the media tab. See denial reason below.  We may be able to approve this drug when you have tried other drugs first (a trial and failure of two preferred injectable calcitonin gene-related peptide [CGRP] drugs, such as Aimovig, [name], and Emgality). We do not see that you have tried these other drugs first, they did not work for you, or they caused you harm.  PA #/Case ID/Reference #: [id number]

## 2023-04-11 NOTE — Telephone Encounter (Signed)
[name], I see she has tried Emgality (could not tolerate inj pain so changed to [place]). Any reason she cannot try [name] or Aimovig? Is it due to injection site pain experience from [facility]?a

## 2023-04-11 NOTE — Telephone Encounter (Signed)
Called pt back. Relayed message from [name] Lomax,NP. Pt reports migraines have worsened even on Qulipta. She had migraine yesterday with nausea/SOB/dizziness. She did not take Ubrelvy. She had sample from [name] but told to have as backup to her prescription. She picked up from pharmacy today. She took a little while ago and it helped some. She works from home on computer. Hard for her to do this d/t light sensitivity.  She is willing to try [name] but wondering about affect if she were to become pregnant. She is not actively trying currently. Aware I will talk with [name] and call her back.

## 2023-04-11 NOTE — Telephone Encounter (Signed)
Called pt. Relayed [name]'s message. She verbalized understanding. She would like to stop [name] and start [name]. I e-scribed rx to pharmacy. She will come now to pick up sample to get started. Pt aware instructions in bag with sample for her.   Sample:  1 box [name] 225mg /1.22ml. NDC: 29528-413-24, Lot: [id number], EXP: 10/2023.

## 2023-04-13 ENCOUNTER — Telehealth: Payer: Self-pay

## 2023-04-13 ENCOUNTER — Other Ambulatory Visit (HOSPITAL_COMMUNITY): Payer: Self-pay

## 2023-04-13 NOTE — Telephone Encounter (Signed)
PA request has been Submitted. New Encounter created for follow up. For additional info see Pharmacy Prior Auth telephone encounter from 04/13/2023.

## 2023-04-13 NOTE — Telephone Encounter (Signed)
Pharmacy Patient Advocate Encounter  Received notification from [facility] that Prior Authorization for [name] (fremanezumab-vfrm) injection 225MG /1.5ML auto-injectors has been APPROVED from 04/13/2023 to 07/12/2023.   PA #/Case ID/Reference #: PA Case ID #: [id number]

## 2023-04-13 NOTE — Telephone Encounter (Signed)
Pharmacy Patient Advocate Encounter   Received notification from Physician's Office that prior authorization for [name] (fremanezumab-vfrm) injection 225MG /1.5ML auto-injectors is required/requested.   Insurance verification completed.   The patient is insured through [facility] .   Per test claim: PA required; PA submitted to above mentioned insurance via CoverMyMeds Key/confirmation #/EOC [id number] Status is pending

## 2023-04-16 NOTE — Telephone Encounter (Signed)
Per PA team " Prior Authorization for [name] (fremanezumab-vfrm) injection 225MG /1.5ML auto-injectors has been APPROVED from 04/13/2023 to 07/12/2023. "

## 2023-04-16 NOTE — Telephone Encounter (Signed)
Patient informed via my chart.

## 2023-05-21 ENCOUNTER — Encounter: Payer: Self-pay | Admitting: Family Medicine

## 2023-06-29 ENCOUNTER — Other Ambulatory Visit (HOSPITAL_COMMUNITY): Payer: Self-pay

## 2023-07-09 ENCOUNTER — Other Ambulatory Visit (HOSPITAL_COMMUNITY): Payer: Self-pay

## 2023-07-25 ENCOUNTER — Other Ambulatory Visit (HOSPITAL_COMMUNITY): Payer: Self-pay

## 2023-07-25 ENCOUNTER — Telehealth: Payer: Self-pay | Admitting: Pharmacy Technician

## 2023-07-25 NOTE — Telephone Encounter (Signed)
 Pharmacy Patient Advocate Encounter  Received notification from [facility] that Prior Authorization for [name] (fremanezumab-vfrm) injection 225MG /1.5ML auto-injectors has been APPROVED from 07/24/2023 to 07/23/2024   PA #/Case ID/Reference #: [id number] Key: [id number]

## 2023-08-01 ENCOUNTER — Emergency Department (HOSPITAL_COMMUNITY)
Admission: EM | Admit: 2023-08-01 | Discharge: 2023-08-01 | Discharge status: Against Medical Advice | Attending: Emergency Medicine | Admitting: Emergency Medicine

## 2023-08-01 ENCOUNTER — Emergency Department (HOSPITAL_COMMUNITY)

## 2023-08-01 ENCOUNTER — Encounter (HOSPITAL_COMMUNITY): Payer: Self-pay

## 2023-08-01 ENCOUNTER — Other Ambulatory Visit: Payer: Self-pay

## 2023-08-01 DIAGNOSIS — R112 Nausea with vomiting, unspecified: Secondary | ICD-10-CM | POA: Insufficient documentation

## 2023-08-01 DIAGNOSIS — Z5329 Procedure and treatment not carried out because of patient's decision for other reasons: Secondary | ICD-10-CM | POA: Diagnosis not present

## 2023-08-01 DIAGNOSIS — E871 Hypo-osmolality and hyponatremia: Secondary | ICD-10-CM | POA: Insufficient documentation

## 2023-08-01 DIAGNOSIS — D649 Anemia, unspecified: Secondary | ICD-10-CM | POA: Diagnosis not present

## 2023-08-01 DIAGNOSIS — R1031 Right lower quadrant pain: Secondary | ICD-10-CM | POA: Diagnosis present

## 2023-08-01 LAB — CBC
HCT: 33.5 % — ABNORMAL LOW (ref 36.0–46.0)
Hemoglobin: 9 g/dL — ABNORMAL LOW (ref 12.0–15.0)
MCH: 18.6 pg — ABNORMAL LOW (ref 26.0–34.0)
MCHC: 26.9 g/dL — ABNORMAL LOW (ref 30.0–36.0)
MCV: 69.1 fL — ABNORMAL LOW (ref 80.0–100.0)
Platelets: 309 10*3/uL (ref 150–400)
RBC: 4.85 MIL/uL (ref 3.87–5.11)
RDW: 24.6 % — ABNORMAL HIGH (ref 11.5–15.5)
WBC: 9.4 10*3/uL (ref 4.0–10.5)
nRBC: 0 % (ref 0.0–0.2)

## 2023-08-01 LAB — URINALYSIS, ROUTINE W REFLEX MICROSCOPIC
Bacteria, UA: NONE SEEN
Bilirubin Urine: NEGATIVE
Glucose, UA: NEGATIVE mg/dL
Hgb urine dipstick: NEGATIVE
Ketones, ur: NEGATIVE mg/dL
Nitrite: NEGATIVE
Protein, ur: NEGATIVE mg/dL
Specific Gravity, Urine: 1.02 (ref 1.005–1.030)
pH: 7 (ref 5.0–8.0)

## 2023-08-01 LAB — COMPREHENSIVE METABOLIC PANEL
ALT: 14 U/L (ref 0–44)
AST: 18 U/L (ref 15–41)
Albumin: 4.5 g/dL (ref 3.5–5.0)
Alkaline Phosphatase: 68 U/L (ref 38–126)
Anion gap: 8 (ref 5–15)
BUN: 13 mg/dL (ref 6–20)
CO2: 22 mmol/L (ref 22–32)
Calcium: 8.9 mg/dL (ref 8.9–10.3)
Chloride: 104 mmol/L (ref 98–111)
Creatinine, Ser: 0.63 mg/dL (ref 0.44–1.00)
GFR, Estimated: >60 mL/min (ref >60)
Glucose, Bld: 98 mg/dL (ref 70–99)
Potassium: 4 mmol/L (ref 3.5–5.1)
Sodium: 134 mmol/L — ABNORMAL LOW (ref 135–145)
Total Bilirubin: 0.5 mg/dL (ref 0.0–1.2)
Total Protein: 8 g/dL (ref 6.5–8.1)

## 2023-08-01 LAB — HCG, SERUM, QUALITATIVE: Preg, Serum: NEGATIVE

## 2023-08-01 LAB — LIPASE, BLOOD: Lipase: 30 U/L (ref 11–51)

## 2023-08-01 MED ORDER — ONDANSETRON 8 MG PO TBDP
8.0000 mg | ORAL_TABLET | Freq: Once | ORAL | Status: AC
  Administered 2023-08-01: 8 mg via ORAL
  Filled 2023-08-01: qty 1

## 2023-08-01 NOTE — ED Provider Notes (Signed)
 [place] EMERGENCY DEPARTMENT AT [facility] Provider Note   CSN: [id number] Arrival date & time: 08/01/23  0900     History  Chief Complaint  Patient presents with   Abdominal Pain   Nausea   Vomiting    Heather Fox is a 28 y.o. female.  HPI 28 year old female history of right renal lithiasis, status post stone extraction, history of pyelonephritis, history of irregular menses and migraine headaches presents today complaining of nausea and vomiting.  She states that since the kidney stone and pyelonephritis she has had some episodes of nausea.  Due to the symptoms she have these before she is often concerned that it is representing these and has come to the ED.  She vomited approximately 4 times last night.  She has not vomited since then but continues to have some nausea.  She did not take any antiemetics or other medications at home prior to coming into the ED.  She continues to have some nausea but otherwise feels improved.  She had a little bit of right flank discomfort.  Denies UTI symptoms.  She denies any abnormal vaginal discharge, painful intercourse, or changes in menses.  States her menstrual cycles are irregular at baseline.     Home Medications Prior to Admission medications   Medication Sig Start Date End Date Taking? Authorizing Provider  ferrous sulfate 325 (65 FE) MG EC tablet Take 1 tablet (325 mg total) by mouth every other day. 04/02/23   Constant, Peggy, MD  Fremanezumab-vfrm ([name]) 225 MG/1.5ML SOAJ Inject 1.5 mLs into the skin every 30 (thirty) days. 04/11/23   Lomax, [name], NP  tranexamic acid (LYSTEDA) 650 MG TABS tablet Take 2 tablets (1,300 mg total) by mouth 3 (three) times daily. Take during menses for a maximum of five days 04/02/23   Constant, Peggy, MD  Ubrogepant (UBRELVY) 100 MG TABS Take 1 tablet (100 mg total) by mouth daily as needed (may repeat dose in 2 hours if needed. No more than 2 doses in 24 hours). 04/03/23   Lomax, [name],  NP  Ubrogepant (UBRELVY) 100 MG TABS Take 1 tablet (100 mg total) by mouth as needed. 04/09/23   Lomax, [name], NP      Allergies    Aleve [naproxen]    Review of Systems   Review of Systems  Physical Exam Updated Vital Signs BP 119/68   Pulse 87   Temp 98.6 F (37 C) (Oral)   Resp 18   SpO2 100%  Physical Exam Vitals reviewed.  Constitutional:      General: She is not in acute distress. HENT:     Head: Normocephalic.     Mouth/Throat:     Mouth: Mucous membranes are moist.  Eyes:     Extraocular Movements: Extraocular movements intact.  Cardiovascular:     Rate and Rhythm: Normal rate and regular rhythm.     Heart sounds: Normal heart sounds.  Pulmonary:     Effort: Pulmonary effort is normal.     Breath sounds: Normal breath sounds.  Abdominal:     General: Abdomen is protuberant. Bowel sounds are normal.     Palpations: Abdomen is soft.     Tenderness: There is no abdominal tenderness.     Hernia: No hernia is present.  Skin:    General: Skin is warm and dry.  Neurological:     General: No focal deficit present.     Mental Status: She is alert.  Psychiatric:  Mood and Affect: Mood normal.     ED Results / Procedures / Treatments   Labs (all labs ordered are listed, but only abnormal results are displayed) Labs Reviewed  COMPREHENSIVE METABOLIC PANEL - Abnormal; Notable for the following components:      Result Value   Sodium 134 (*)    All other components within normal limits  CBC - Abnormal; Notable for the following components:   Hemoglobin 9.0 (*)    HCT 33.5 (*)    MCV 69.1 (*)    MCH 18.6 (*)    MCHC 26.9 (*)    RDW 24.6 (*)    All other components within normal limits  URINALYSIS, ROUTINE W REFLEX MICROSCOPIC - Abnormal; Notable for the following components:   Leukocytes,Ua TRACE (*)    All other components within normal limits  LIPASE, BLOOD  HCG, SERUM, QUALITATIVE    EKG None  Radiology US Pelvis Complete Result Date:  08/01/2023 CLINICAL DATA:  Right quadrant pain EXAM: TRANSABDOMINAL AND TRANSVAGINAL ULTRASOUND OF PELVIS DOPPLER ULTRASOUND OF OVARIES TECHNIQUE: Both transabdominal and transvaginal ultrasound examinations of the pelvis were performed. Transabdominal technique was performed for global imaging of the pelvis including uterus, ovaries, adnexal regions, and pelvic cul-de-sac. It was necessary to proceed with endovaginal exam following the transabdominal exam to visualize the uterus endometrium adnexa. Color and duplex Doppler ultrasound was utilized to evaluate blood flow to the ovaries. COMPARISON:  CT 03/30/2023, pelvic ultrasound 03/09/2023 FINDINGS: Uterus Measurements: 9.9 x 5 x 6.6 cm = volume: 1 7 mL. No fibroids or other mass visualized. Endometrium Thickness: 9.2 mm.  No focal abnormality visualized. Right ovary Measurements: 4.6 x 2.6 x 3.2 cm = volume: 20.4 mL. Normal appearance/no adnexal mass. Left ovary Measurements: 3.8 x 2.5 x 3.2 cm = volume: 16.3 mL. Normal appearance/no adnexal mass. Pulsed Doppler evaluation of both ovaries demonstrates normal low-resistance arterial and venous waveforms. Other findings Trace free fluid IMPRESSION: Negative pelvic ultrasound. Electronically Signed   By: Jasmine Pang M.D.   On: 08/01/2023 16:17   US Transvaginal Non-OB Result Date: 08/01/2023 CLINICAL DATA:  Right quadrant pain EXAM: TRANSABDOMINAL AND TRANSVAGINAL ULTRASOUND OF PELVIS DOPPLER ULTRASOUND OF OVARIES TECHNIQUE: Both transabdominal and transvaginal ultrasound examinations of the pelvis were performed. Transabdominal technique was performed for global imaging of the pelvis including uterus, ovaries, adnexal regions, and pelvic cul-de-sac. It was necessary to proceed with endovaginal exam following the transabdominal exam to visualize the uterus endometrium adnexa. Color and duplex Doppler ultrasound was utilized to evaluate blood flow to the ovaries. COMPARISON:  CT 03/30/2023, pelvic ultrasound  03/09/2023 FINDINGS: Uterus Measurements: 9.9 x 5 x 6.6 cm = volume: 1 7 mL. No fibroids or other mass visualized. Endometrium Thickness: 9.2 mm.  No focal abnormality visualized. Right ovary Measurements: 4.6 x 2.6 x 3.2 cm = volume: 20.4 mL. Normal appearance/no adnexal mass. Left ovary Measurements: 3.8 x 2.5 x 3.2 cm = volume: 16.3 mL. Normal appearance/no adnexal mass. Pulsed Doppler evaluation of both ovaries demonstrates normal low-resistance arterial and venous waveforms. Other findings Trace free fluid IMPRESSION: Negative pelvic ultrasound. Electronically Signed   By: Jasmine Pang M.D.   On: 08/01/2023 16:17   [place] Art/Ven Flow Abd Pelv Doppler Result Date: 08/01/2023 CLINICAL DATA:  Right quadrant pain EXAM: TRANSABDOMINAL AND TRANSVAGINAL ULTRASOUND OF PELVIS DOPPLER ULTRASOUND OF OVARIES TECHNIQUE: Both transabdominal and transvaginal ultrasound examinations of the pelvis were performed. Transabdominal technique was performed for global imaging of the pelvis including uterus, ovaries, adnexal regions, and pelvic  cul-de-sac. It was necessary to proceed with endovaginal exam following the transabdominal exam to visualize the uterus endometrium adnexa. Color and duplex Doppler ultrasound was utilized to evaluate blood flow to the ovaries. COMPARISON:  CT 03/30/2023, pelvic ultrasound 03/09/2023 FINDINGS: Uterus Measurements: 9.9 x 5 x 6.6 cm = volume: 1 7 mL. No fibroids or other mass visualized. Endometrium Thickness: 9.2 mm.  No focal abnormality visualized. Right ovary Measurements: 4.6 x 2.6 x 3.2 cm = volume: 20.4 mL. Normal appearance/no adnexal mass. Left ovary Measurements: 3.8 x 2.5 x 3.2 cm = volume: 16.3 mL. Normal appearance/no adnexal mass. Pulsed Doppler evaluation of both ovaries demonstrates normal low-resistance arterial and venous waveforms. Other findings Trace free fluid IMPRESSION: Negative pelvic ultrasound. Electronically Signed   By: Jasmine Pang M.D.   On: 08/01/2023 16:17     Procedures Procedures    Medications Ordered in ED Medications  ondansetron (ZOFRAN-ODT) disintegrating tablet 8 mg (8 mg Oral Given 08/01/23 1427)    ED Course/ Medical Decision Making/ A&P Clinical Course as of 08/01/23 1749  Wed Aug 01, 2023  1402 Complete metabolic panel reviewed and significant for mild hyponatremia sodium 134 otherwise within normal limits  [DR]  1402 CBC is reviewed and interpreted significant for anemia with hemoglobin of 9 which is stable from first prior [DR]  1402 Urinalysis is reviewed interpreted without significant abnormalities [DR]  1749 Negative pelvic ultrasound [DR]    Clinical Course User Index [DR] Margarita Grizzle, MD                                 Medical Decision Making Amount and/or Complexity of Data Reviewed Labs: ordered.  Risk Prescription drug management.   28 year old female with history of renal colic presents today with nausea and vomiting.  No tenderness is noted on exam.  She had ultrasound of pelvis done prior to my evaluation and these results are pending Labs are unremarkable with stable anemia and mild hyponatremia Patient's symptoms are improved with decreased vomiting here.  She has been given Zofran for symptoms here.  We will do oral trial Patient left AMA prior to completing evaluation       Final Clinical Impression(s) / ED Diagnoses Final diagnoses:  Right lower quadrant abdominal pain    Rx / DC Orders ED Discharge Orders     None         Margarita Grizzle, MD 08/01/23 1749

## 2023-08-01 NOTE — ED Triage Notes (Signed)
 Pt reports abdominal pain, RLQ that started last night with N/V. Denies any other symptoms

## 2023-08-01 NOTE — ED Provider Triage Note (Cosign Needed)
 Emergency Medicine Provider Triage Evaluation Note  Heather Fox , a 28 y.o. female  was evaluated in triage.  Pt complains of RLQ pain. Hx kidney stones and ovarian cysts  Review of Systems  Positive: Cramping pain, N/V, irregular periods Negative: Hematuria, blood in stool, constipation, diarrhea, fever, chills, CVA tenderness, OCPs  Physical Exam  BP 114/68 (BP Location: Left Arm)   Pulse 94   Temp 99.5 F (37.5 C) (Oral)   Resp 18   SpO2 100%  Gen:   Awake, uncomfortable Resp:  Normal effort  MSK:   Moves extremities without difficulty  Other:  Neg Murphy, Rosving, rebound tenderness, McBurneys  Medical Decision Making  Medically screening exam initiated at 12:35 PM.  Appropriate orders placed.  Heather Fox was informed that the remainder of the evaluation will be completed by another provider, this initial triage assessment does not replace that evaluation, and the importance of remaining in the ED until their evaluation is complete.  Imaging ordered   Dolphus Jenny, PA-C 08/01/23 1241

## 2023-09-20 NOTE — Patient Instructions (Signed)
 Below is our plan:  We will continue [name]  every 30 days. I called rx in for 3 pens at a time. Stor unused pens in the refrigerator. Continue rest and relaxation for abortive therapy. Let me know if migraines are worsening. We will allow FMLA for 1-2 days a month lasting 24 hours each as discussed. Submit paperwork when you can.   Please make sure you are staying well hydrated. I recommend 50-60 ounces daily. Well balanced diet and regular exercise encouraged. Consistent sleep schedule with 6-8 hours recommended.   Please continue follow up with care team as directed.   Follow up with me in 1 year   You may receive a survey regarding today's visit. I encourage you to leave honest feed back as I do use this information to improve patient care. Thank you for seeing me today!   GENERAL HEADACHE INFORMATION:   Natural supplements: Magnesium  Oxide or Magnesium  Glycinate 500 mg at bed (up to 800 mg daily) Coenzyme Q10 300 mg in AM Vitamin B2- 200 mg twice a day   Add 1 supplement at a time since even natural supplements can have undesirable side effects. You can sometimes buy supplements cheaper (especially Coenzyme Q10) at www.WebmailGuide.co.za or at [facility].  Migraine with aura: There is increased risk for stroke in women with migraine with aura and a contraindication for the combined contraceptive pill for use by women who have migraine with aura. The risk for women with migraine without aura is lower. However other risk factors like smoking are far more likely to increase stroke risk than migraine. There is a recommendation for no smoking and for the use of OCPs without estrogen such as progestogen only pills particularly for women with migraine with aura.Aaron Aas People who have migraine headaches with auras may be 3 times more likely to have a stroke caused by a blood clot, compared to migraine patients who don't see auras. Women who take hormone-replacement therapy may be 30 percent more likely to suffer a  clot-based stroke than women not taking medication containing estrogen. Other risk factors like smoking and high blood pressure may be  much more important.    Vitamins and herbs that show potential:   Magnesium : Magnesium  (250 mg twice a day or 500 mg at bed) has a relaxant effect on smooth muscles such as blood vessels. Individuals suffering from frequent or daily headache usually have low magnesium  levels which can be increase with daily supplementation of 400-750 mg. Three trials found 40-90% average headache reduction  when used as a preventative. Magnesium  may help with headaches are aura, the best evidence for magnesium  is for migraine with aura is its thought to stop the cortical spreading depression we believe is the pathophysiology of migraine aura.Magnesium  also demonstrated the benefit in menstrually related migraine.  Magnesium  is part of the messenger system in the serotonin cascade and it is a good muscle relaxant.  It is also useful for constipation which can be a side effect of other medications used to treat migraine. Good sources include nuts, whole grains, and tomatoes. Side Effects: loose stool/diarrhea  Riboflavin (vitamin B 2) 200 mg twice a day. This vitamin assists nerve cells in the production of ATP a principal energy storing molecule.  It is necessary for many chemical reactions in the body.  There have been at least 3 clinical trials of riboflavin using 400 mg per day all of which suggested that migraine frequency can be decreased.  All 3 trials showed significant improvement in over  half of migraine sufferers.  The supplement is found in bread, cereal, milk, meat, and poultry.  Most Americans get more riboflavin than the recommended daily allowance, however riboflavin deficiency is not necessary for the supplements to help prevent headache. Side effects: energizing, green urine   Coenzyme Q10: This is present in almost all cells in the body and is critical component for the  conversion of energy.  Recent studies have shown that a nutritional supplement of CoQ10 can reduce the frequency of migraine attacks by improving the energy production of cells as with riboflavin.  Doses of 150 mg twice a day have been shown to be effective.   Melatonin: Increasing evidence shows correlation between melatonin secretion and headache conditions.  Melatonin supplementation has decreased headache intensity and duration.  It is widely used as a sleep aid.  Sleep is natures way of dealing with migraine.  A dose of 3 mg is recommended to start for headaches including cluster headache. Higher doses up to 15 mg has been reviewed for use in Cluster headache and have been used. The rationale behind using melatonin for cluster is that many theories regarding the cause of Cluster headache center around the disruption of the normal circadian rhythm in the brain.  This helps restore the normal circadian rhythm.   HEADACHE DIET: Foods and beverages which may trigger migraine Note that only 20% of headache patients are food sensitive. You will know if you are food sensitive if you get a headache consistently 20 minutes to 2 hours after eating a certain food. Only cut out a food if it causes headaches, otherwise you might remove foods you enjoy! What matters most for diet is to eat a well balanced healthy diet full of vegetables and low fat protein, and to not miss meals.   Chocolate, other sweets ALL cheeses except cottage and cream cheese Dairy products, yogurt, sour cream, ice cream Liver Meat extracts (Bovril, Marmite, meat tenderizers) Meats or fish which have undergone aging, fermenting, pickling or smoking. These include: Hotdogs,salami,Lox,sausage, mortadellas,smoked salmon, pepperoni, Pickled herring Pods of broad bean (English beans, Chinese pea pods, [place] (fava) beans, lima and navy beans Ripe avocado, ripe banana Yeast extracts or active yeast preparations such as Brewer's or  Fleishman's (commercial bakes goods are permitted) Tomato based foods, pizza (lasagna, etc.)   MSG (monosodium glutamate) is disguised as many things; look for these common aliases: Monopotassium glutamate Autolysed yeast Hydrolysed protein Sodium caseinate "flavorings" "all natural preservatives" Nutrasweet   Avoid all other foods that convincingly provoke headaches.   Resources: The Dizzy Althia Jetty Your Headache Diet, migrainestrong.com  https://zamora-andrews.com/   Caffeine and Migraine For patients that have migraine, caffeine intake more than 3 days per week can lead to dependency and increased migraine frequency. I would recommend cutting back on your caffeine intake as best you can. The recommended amount of caffeine is 200-300 mg daily, although migraine patients may experience dependency at even lower doses. While you may notice an increase in headache temporarily, cutting back will be helpful for headaches in the long run. For more information on caffeine and migraine, visit: https://americanmigrainefoundation.org/resource-library/caffeine-and-migraine/   Headache Prevention Strategies:   1. Maintain a headache diary; learn to identify and avoid triggers.  - This can be a simple note where you log when you had a headache, associated symptoms, and medications used - There are several smartphone apps developed to help track migraines: Migraine Buddy, Migraine Monitor, Curelator N1-Headache App   Common triggers include: Emotional triggers: Emotional/Upset family or  friends Emotional/Upset occupation Business reversal/success Anticipation anxiety Crisis-serious Post-crisis periodNew job/position   Physical triggers: Vacation Day Weekend Strenuous Exercise High Altitude Location New Move Menstrual Day Physical Illness Oversleep/Not enough sleep Weather changes Light: Photophobia or light sesnitivity treatment involves  a balance between desensitization and reduction in overly strong input. Use dark polarized glasses outside, but not inside. Avoid bright or fluorescent light, but do not dim environment to the point that going into a normally lit room hurts. Consider FL-41 tint lenses, which reduce the most irritating wavelengths without blocking too much light.  These can be obtained at axonoptics.com or theraspecs.com Foods: see list above.   2. Limit use of acute treatments (over-the-counter medications, triptans, etc.) to no more than 2 days per week or 10 days per month to prevent medication overuse headache (rebound headache).     3. Follow a regular schedule (including weekends and holidays): Don't skip meals. Eat a balanced diet. 8 hours of sleep nightly. Minimize stress. Exercise 30 minutes per day. Being overweight is associated with a 5 times increased risk of chronic migraine. Keep well hydrated and drink 6-8 glasses of water  per day.   4. Initiate non-pharmacologic measures at the earliest onset of your headache. Rest and quiet environment. Relax and reduce stress. Breathe2Relax is a free app that can instruct you on    some simple relaxtion and breathing techniques. Http://Dawnbuse.com is a    free website that provides teaching videos on relaxation.  Also, there are  many apps that   can be downloaded for "mindful" relaxation.  An app called YOGA NIDRA will help walk you through mindfulness. Another app called Calm can be downloaded to give you a structured mindfulness guide with daily reminders and skill development. Headspace for guided meditation Mindfulness Based Stress Reduction Online Course: www.palousemindfulness.com Cold compresses.   5. Don't wait!! Take the maximum allowable dosage of prescribed medication at the first sign of migraine.   6. Compliance:  Take prescribed medication regularly as directed and at the first sign of a migraine.   7. Communicate:  Call your physician when  problems arise, especially if your headaches change, increase in frequency/severity, or become associated with neurological symptoms (weakness, numbness, slurred speech, etc.). Proceed to emergency room if you experience new or worsening symptoms or symptoms do not resolve, if you have new neurologic symptoms or if headache is severe, or for any concerning symptom.   8. Headache/pain management therapies: Consider various complementary methods, including medication, behavioral therapy, psychological counselling, biofeedback, massage therapy, acupuncture, dry needling, and other modalities.  Such measures may reduce the need for medications. Counseling for pain management, where patients learn to function and ignore/minimize their pain, seems to work very well.   9. Recommend changing family's attention and focus away from patient's headaches. Instead, emphasize daily activities. If first question of day is 'How are your headaches/Do you have a headache today?', then patient will constantly think about headaches, thus making them worse. Goal is to re-direct attention away from headaches, toward daily activities and other distractions.   10. Helpful Websites: www.AmericanHeadacheSociety.org PatentHood.ch www.headaches.org TightMarket.nl www.achenet.org

## 2023-09-20 NOTE — Progress Notes (Unsigned)
 PATIENT: Heather Fox DOB: [birth year]  REASON FOR VISIT: follow up HISTORY FROM: patient  Virtual Visit via MyChart video  I connected with Heather Fox on 09/24/23 at  9:30 AM EDT via MyChart video and verified that I am speaking with the correct person using two identifiers.   I discussed the limitations, risks, security and privacy concerns of performing an evaluation and management service by Mychart video and the availability of in person appointments. I also discussed with the patient that there may be a patient responsible charge related to this service. The patient expressed understanding and agreed to proceed.   History of Present Illness:  09/24/23 ALL (Mychart): Heather Fox is a 28 y.o. female here today for follow up for migraines. She was last seen 02/2023 and started Qulipta . She called 03/2023 reporting no improvement and we switched her to [name] . Since, she reports migraines are well managed. She has had trouble getting her injections consistently due to needing PA. Now covered through 07/2024. Ubrelvy  was not effective. Heather Fox has tried OTC analgesics and multiple triptans with no benefit. Rest and complementary therapies works best. She works from home and uses computer all day. She is requesting to renew FMLA.   02/22/23 ALL:  Heather Fox is a 28 y.o. female here today for follow up for migraines. She was last seen by Dr Billy Bue 01/2022. She reported brain fog and increased intensity of migraines on propranolol . Ubrelvy  was not effective. She was switched to Emgality  and Nurtec.   Since, she reports stopping Emgality  in 05/2022. She had kidney stones and was told to stop her medications. She reports injections were really painful. Emgality  was very helpful in managing migraines. Now having about 8-12 migraine days a month. Nurtec does seem to help reduce severity but does not always abort migraines.   Three samples of Qulipta  and Ubrelvy  given in  office. Topiramate contraindicated in hx kidney stones. Failed propranolol  and amitriptyline .  HISTORY (copied from Dr Margrette Shield previous note)  27 year old female who follows in clinic for migraines.   At her last visit brain MRI was ordered. She was started on propranolol  for migraine prevention and Ubrelvy  for rescue.   Interval History: She has had some improvement in migraine frequency since starting propranolol . However she feels her headache severity is worse. She currently has 2 headaches per week. She has been taking propranolol  inconsistently because it causes brain fog and a sensation of pressure in her head.   Ubrelvy  does not help much for rescue. It can take hours to start working.   Brain MRI 09/13/21 was unremarkable.   Headache days per month: 8 Headache free days per month: 22   Current Headache Regimen: Preventative: propranolol  20 mg BID Abortive: Ubrelvy  100 mg PRN   Prior Therapies                                  Amitriptyline  25 mg at bedtime - drowsiness Propranolol  - brain fog Imitrex  100 mg PRN - dizziness, drowsiness Maxalt  10 mg PRN - lack of efficacy Ubrelvy  100 mg PRN - lack of efficacy  Observations/Objective:  Generalized: Well developed, in no acute distress  Mentation: Alert oriented to time, place, history taking. Follows all commands speech and language fluent   Assessment and Plan:  28 y.o. year old female  has a past medical history of Anemia, GERD (gastroesophageal reflux disease), History of kidney  stones, and Migraine. here with    ICD-10-CM   1. Migraine without aura and without status migrainosus, not intractable  G43.009      Heather Fox is doing well. We will continue [name]  every 30 days. 90 day script called in to assist with consistency. She will continue complementary therapies for abortive therapy at this time as OTC and rx meds have not been effective. We have discussed FMLA and I agreed to allow per our migraine policy. She  will submit paperwork soon. Healthy lifestyle habits encouraged. She will follow up with me in 1 year, sooner if needed.   No orders of the defined types were placed in this encounter.   No orders of the defined types were placed in this encounter.    Follow Up Instructions:  I discussed the assessment and treatment plan with the patient. The patient was provided an opportunity to ask questions and all were answered. The patient agreed with the plan and demonstrated an understanding of the instructions.   The patient was advised to call back or seek an in-person evaluation if the symptoms worsen or if the condition fails to improve as anticipated.  I provided 15 minutes of face-to-face and non face-to-face time during this MyChart video encounter. Patient located at their place of residence. Provider is in the office.    Hussain Maimone, NP

## 2023-09-24 ENCOUNTER — Encounter: Payer: Self-pay | Admitting: Family Medicine

## 2023-09-24 ENCOUNTER — Telehealth: Payer: Medicaid Other | Admitting: Family Medicine

## 2023-09-24 DIAGNOSIS — G43009 Migraine without aura, not intractable, without status migrainosus: Secondary | ICD-10-CM | POA: Diagnosis not present

## 2023-09-24 MED ORDER — AJOVY 225 MG/1.5ML ~~LOC~~ SOAJ: 1.5000 mL | SUBCUTANEOUS | 3 refills | Status: AC

## 2024-03-11 ENCOUNTER — Ambulatory Visit: Admitting: Obstetrics and Gynecology

## 2024-04-08 ENCOUNTER — Ambulatory Visit: Admitting: Physician Assistant

## 2024-09-22 ENCOUNTER — Telehealth: Admitting: Family Medicine

## 2024-09-25 ENCOUNTER — Telehealth: Admitting: Family Medicine
